# Patient Record
Sex: Male | Born: 1940 | Race: White | Hispanic: No | Smoking: Light tobacco smoker
Health system: Southern US, Community
[De-identification: ages and names within clinical notes are randomized; demographics above are authoritative.]

## PROBLEM LIST (undated history)

## (undated) DIAGNOSIS — E785 Hyperlipidemia, unspecified: Secondary | ICD-10-CM

## (undated) DIAGNOSIS — I10 Essential (primary) hypertension: Secondary | ICD-10-CM

## (undated) DIAGNOSIS — N189 Chronic kidney disease, unspecified: Secondary | ICD-10-CM

## (undated) DIAGNOSIS — I2510 Atherosclerotic heart disease of native coronary artery without angina pectoris: Secondary | ICD-10-CM

## (undated) DIAGNOSIS — I509 Heart failure, unspecified: Secondary | ICD-10-CM

## (undated) DIAGNOSIS — R569 Unspecified convulsions: Secondary | ICD-10-CM

## (undated) DIAGNOSIS — E119 Type 2 diabetes mellitus without complications: Secondary | ICD-10-CM

## (undated) DIAGNOSIS — K219 Gastro-esophageal reflux disease without esophagitis: Secondary | ICD-10-CM

## (undated) DIAGNOSIS — I219 Acute myocardial infarction, unspecified: Secondary | ICD-10-CM

## (undated) DIAGNOSIS — J45909 Unspecified asthma, uncomplicated: Secondary | ICD-10-CM

## (undated) HISTORY — DX: Acute myocardial infarction, unspecified: I21.9

## (undated) HISTORY — DX: Hyperlipidemia, unspecified: E78.5

## (undated) HISTORY — PX: CATARACT EXTRACTION, BILATERAL: SHX1313

## (undated) HISTORY — PX: CORONARY ANGIOPLASTY: SHX604

## (undated) HISTORY — DX: Type 2 diabetes mellitus without complications: E11.9

## (undated) HISTORY — DX: Essential (primary) hypertension: I10

## (undated) HISTORY — PX: COLONOSCOPY: SHX174

## (undated) HISTORY — PX: RETINAL DETACHMENT SURGERY: SHX105

## (undated) HISTORY — PX: CARDIAC SURGERY: SHX584

## (undated) HISTORY — DX: Heart failure, unspecified: I50.9

---

## 2007-07-31 ENCOUNTER — Ambulatory Visit: Payer: Self-pay | Admitting: Gastroenterology

## 2014-02-28 ENCOUNTER — Ambulatory Visit: Payer: Self-pay | Admitting: Family Medicine

## 2015-01-10 ENCOUNTER — Ambulatory Visit: Payer: Self-pay | Admitting: Ophthalmology

## 2015-01-11 ENCOUNTER — Ambulatory Visit: Payer: Self-pay | Admitting: Ophthalmology

## 2015-03-19 NOTE — Op Note (Signed)
PATIENT NAME:  Manuel Klein, Manuel Klein MR#:  [id number] DATE OF BIRTH:  1941-04-09  DATE OF PROCEDURE:  01/11/2015  PREPROCEDURE DIAGNOSIS: Macula off retinal detachment, left eye.  POSTPROCEDURE DIAGNOSIS: Macula off retinal detachment, left eye.  PROCEDURE: A 25-gauge pars plana vitrectomy with endolaser air-fluid exchange in 26% SF6.   SURGEON: Milus Height, MD  ANESTHESIA: Monitored anesthesia care with retrobulbar block.   COMPLICATIONS: None.   BLOOD LOSS: Minimal.   DESCRIPTION OF PROCEDURE: The patient presented to the clinic with a shallow macula off retinal detachment in the left eye. Risks, benefits, alternatives, and complications were discussed with the patient, and he elected to proceed with pars plana vitrectomy in the left eye.   On the date of surgery, the patient was greeted in the preoperative holding area. The left eye was marked. Consents were reviewed. The patient's questions were answered. The patient was then brought into the operating room and placed under monitored anesthesia care. Then 4 mL of a peribulbar block consisting of lidocaine plain, Marcaine plain and Wydase was injected. The left eye was then prepped and draped in the usual sterile fashion. Then 25-gauge trocars were then used, placed the trocars in the usual position. The infusion cannula was checked before starting the infusion. A core vitrectomy was performed. The patient notably did have a posterior vitreous attachment already. There was significant amount of vitreous debris, although no frank membranes were noted. The vitreous was trimmed for 360 degrees around the periphery with scleral depression. The breaks were then marked with endo-cautery. Then 360 degree scleral depression at this time was notable for several other tears in the areas of the detached retina and also in the area of attached retina as well. The breaks were marked. Further vitrectomy was performed over the area of the other retinal breaks  to ensure no vitreous traction on those areas. A fluid-fluid exchange was performed to clear the subretinal fluid and then the infusion was turned to air and an air-fluid exchange was performed. A drain retinotomy was created in the meridian of the original retinal breaks and the subretinal fluid was drained from underneath the retina until the retina was flat. Endolaser was applied to the retinal drain and all the areas of the retinal breaks along with a light PRP style laser in all the areas of detached retina and inferiorly. A confluent  row of laser was placed at the vitreous base for 270 degrees and the retinal surface was dried completely. Four times the vitreous volume of 26% SF6 was infused. The trocars were then removed. The pressure was acceptable by palpation. Subconjunctival cefuroxime and dexamethasone were injected. The patient was patched and shielded with Neo-Poly-Dex ointment and taken to the recovery area in stable condition.   ____________________________ Laban Emperor Oval Linsey, MD jdr:sb D: 01/15/2015 23:32:04 ET T: 01/16/2015 07:58:04 ET JOB#: [id number]  cc: Janett Billow D. Oval Linsey, MD, <Dictator> Damante Spragg D [facility] MD ELECTRONICALLY SIGNED 01/18/2015 10:08

## 2017-10-27 ENCOUNTER — Encounter (INDEPENDENT_AMBULATORY_CARE_PROVIDER_SITE_OTHER): Payer: Self-pay | Admitting: Vascular Surgery

## 2017-11-03 ENCOUNTER — Other Ambulatory Visit (INDEPENDENT_AMBULATORY_CARE_PROVIDER_SITE_OTHER): Payer: Self-pay | Admitting: Vascular Surgery

## 2017-11-03 ENCOUNTER — Other Ambulatory Visit: Payer: Self-pay

## 2017-11-03 ENCOUNTER — Encounter
Admission: RE | Admit: 2017-11-03 | Discharge: 2017-11-03 | Disposition: A | Discharge status: Home / Self Care | Payer: Medicare Other | Source: Ambulatory Visit | Attending: Vascular Surgery | Admitting: Vascular Surgery

## 2017-11-03 ENCOUNTER — Encounter (INDEPENDENT_AMBULATORY_CARE_PROVIDER_SITE_OTHER): Payer: Self-pay

## 2017-11-03 ENCOUNTER — Encounter (INDEPENDENT_AMBULATORY_CARE_PROVIDER_SITE_OTHER): Payer: Self-pay | Admitting: Vascular Surgery

## 2017-11-03 ENCOUNTER — Ambulatory Visit (INDEPENDENT_AMBULATORY_CARE_PROVIDER_SITE_OTHER): Payer: Medicare Other | Admitting: Vascular Surgery

## 2017-11-03 VITALS — BP 148/69 | HR 59 | Resp 17 | Ht 69.5 in | Wt 156.6 lb

## 2017-11-03 DIAGNOSIS — Z01818 Encounter for other preprocedural examination: Secondary | ICD-10-CM | POA: Insufficient documentation

## 2017-11-03 DIAGNOSIS — Z01810 Encounter for preprocedural cardiovascular examination: Secondary | ICD-10-CM | POA: Insufficient documentation

## 2017-11-03 DIAGNOSIS — N185 Chronic kidney disease, stage 5: Secondary | ICD-10-CM | POA: Insufficient documentation

## 2017-11-03 DIAGNOSIS — I10 Essential (primary) hypertension: Secondary | ICD-10-CM | POA: Diagnosis not present

## 2017-11-03 DIAGNOSIS — E785 Hyperlipidemia, unspecified: Secondary | ICD-10-CM

## 2017-11-03 DIAGNOSIS — E118 Type 2 diabetes mellitus with unspecified complications: Secondary | ICD-10-CM | POA: Insufficient documentation

## 2017-11-03 DIAGNOSIS — Z992 Dependence on renal dialysis: Secondary | ICD-10-CM | POA: Insufficient documentation

## 2017-11-03 HISTORY — DX: Unspecified convulsions: R56.9

## 2017-11-03 HISTORY — DX: Atherosclerotic heart disease of native coronary artery without angina pectoris: I25.10

## 2017-11-03 HISTORY — DX: Unspecified asthma, uncomplicated: J45.909

## 2017-11-03 HISTORY — DX: Chronic kidney disease, unspecified: N18.9

## 2017-11-03 HISTORY — DX: Gastro-esophageal reflux disease without esophagitis: K21.9

## 2017-11-03 LAB — CBC WITH DIFFERENTIAL/PLATELET
Basophils Absolute: 0.1 10*3/uL (ref 0–0.1)
Basophils Relative: 1 %
EOS ABS: 0.2 10*3/uL (ref 0–0.7)
EOS PCT: 3 %
HCT: 32.5 % — ABNORMAL LOW (ref 40.0–52.0)
HEMOGLOBIN: 11 g/dL — AB (ref 13.0–18.0)
LYMPHS ABS: 1.2 10*3/uL (ref 1.0–3.6)
Lymphocytes Relative: 17 %
MCH: 32.2 pg (ref 26.0–34.0)
MCHC: 34 g/dL (ref 32.0–36.0)
MCV: 94.6 fL (ref 80.0–100.0)
MONO ABS: 0.6 10*3/uL (ref 0.2–1.0)
MONOS PCT: 8 %
Neutro Abs: 5.3 10*3/uL (ref 1.4–6.5)
Neutrophils Relative %: 71 %
PLATELETS: 232 10*3/uL (ref 150–440)
RBC: 3.43 MIL/uL — ABNORMAL LOW (ref 4.40–5.90)
RDW: 13.4 % (ref 11.5–14.5)
WBC: 7.4 10*3/uL (ref 3.8–10.6)

## 2017-11-03 LAB — BASIC METABOLIC PANEL
Anion gap: 10 (ref 5–15)
BUN: 51 mg/dL — AB (ref 6–20)
CHLORIDE: 107 mmol/L (ref 101–111)
CO2: 23 mmol/L (ref 22–32)
CREATININE: 3.95 mg/dL — AB (ref 0.61–1.24)
Calcium: 8.5 mg/dL — ABNORMAL LOW (ref 8.9–10.3)
GFR calc Af Amer: 16 mL/min — ABNORMAL LOW (ref >60)
GFR calc non Af Amer: 14 mL/min — ABNORMAL LOW (ref >60)
Glucose, Bld: 261 mg/dL — ABNORMAL HIGH (ref 65–99)
Potassium: 4.8 mmol/L (ref 3.5–5.1)
Sodium: 140 mmol/L (ref 135–145)

## 2017-11-03 LAB — TYPE AND SCREEN
ABO/RH(D): A POS
Antibody Screen: NEGATIVE

## 2017-11-03 LAB — APTT: APTT: 26 s (ref 24–36)

## 2017-11-03 LAB — PROTIME-INR
INR: 1.03
PROTHROMBIN TIME: 13.4 s (ref 11.4–15.2)

## 2017-11-03 NOTE — Patient Instructions (Addendum)
Your procedure is scheduled on: Friday 11/07/17 Report to [place]. 2ND FLOOR MEDICAL MALL ENTRANCE. To find out your arrival time please call [phone] between 1PM - 3PM on Thursday 11/06/17.  Remember: Instructions that are not followed completely may result in serious medical risk, up to and including death, or upon the discretion of your surgeon and anesthesiologist your surgery may need to be rescheduled.    __X__ 1. Do not eat anything after midnight the night before your    procedure.  No gum chewing or hard candies.  You may drink clear   liquids up to 2 hours before you are scheduled to arrive at the   hospital for your procedure. Do not drink clear liquids within 2   hours of scheduled arrival to the hospital as this may lead to your   procedure being delayed or rescheduled.       Clear liquids include:   Water or Apple juice without pulp   Clear carbohydrate beverage such as Clearfast or Gatorade   Black coffee or Clear Tea (no milk, no creamer, do not add anything   to the coffee or tea)    Diabetics should only drink water   __X__ 2. No Alcohol for 24 hours before or after surgery.   ____ 3. Bring all medications with you on the day of surgery if instructed.    __X__ 4. Notify your doctor if there is any change in your medical condition     (cold, fever, infections).             __X___5. No smoking within 24 hours of your surgery.     Do not wear jewelry, make-up, hairpins, clips or nail polish.  Do not wear lotions, powders, or perfumes.   Do not shave 48 hours prior to surgery. Men may shave face and neck.  Do not bring valuables to the hospital.    [facility] is not responsible for any belongings or valuables.               Contacts, dentures or bridgework may not be worn into surgery.  Leave your suitcase in the car. After surgery it may be brought to your room.  For patients admitted to the hospital, discharge time is determined by your                 treatment team.   Patients discharged the day of surgery will not be allowed to drive home.   Please read over the following fact sheets that you were given:   MRSA Information   __X__ Take these medicines the morning of surgery with A SIP OF WATER:    1. DILTIAZEM  2. METOPROLOL  3. RANITIDINE  4. TAMSULOSIN  5. VENTOLIN  6.  ____ Fleet Enema (as directed)   __X__ Use CHG Soap/SAGE wipes as directed  __X__ Use inhalers on the day of surgery  ____ Stop metformin 2 days prior to surgery    ____ Take 1/2 of usual insulin dose the night before surgery and none on the morning of surgery.   __X__ Stop Coumadin/Plavix/aspirin on cONTACT DR [place]  __X__ Stop Anti-inflammatories such as Advil, Aleve, Ibuprofen, Motrin, Naproxen, Naprosyn, Goodies,powder, or aspirin products.  OK to take Tylenol.   __X__ Stop supplements, Vitamin E, Fish Oil until after surgery.    ____ Bring C-Pap to the hospital.

## 2017-11-03 NOTE — Progress Notes (Signed)
Subjective:    Patient ID: Manuel Klein, male    DOB: Apr 25, 1941, 76 y.o.   MRN: [id number] Chief Complaint  Patient presents with  . New Patient (Initial Visit)    pd cath replacement   Presents as a new patient referred by Dr. Holley Raring for creation of a peritoneal dialysis catheter.  The patient is currently not on dialysis however Dr. Holley Raring predicts he will need to start in approximately one month.  The patient was seen with his wife.  At this point, he is asymptomatic.  He denies any uremic symptoms.  Denies any fever, nausea vomiting.  The patient is a attended a dialysis class and has decided to move forward with peritoneal dialysis versus hemodialysis.  The presents today to discuss creation.   Review of Systems  Constitutional: Negative.   HENT: Negative.   Eyes: Negative.   Respiratory: Negative.   Cardiovascular: Negative.   Gastrointestinal: Negative.   Endocrine: Negative.   Genitourinary:       ESRD  Musculoskeletal: Negative.   Skin: Negative.   Allergic/Immunologic: Negative.   Neurological: Negative.   Hematological: Negative.   Psychiatric/Behavioral: Negative.       Objective:   Physical Exam  Constitutional: He is oriented to person, place, and time. He appears well-developed and well-nourished. No distress.  HENT:  Head: Normocephalic and atraumatic.  Eyes: Conjunctivae are normal. Pupils are equal, round, and reactive to light.  Neck: Normal range of motion.  Cardiovascular: Normal rate, regular rhythm, normal heart sounds and intact distal pulses.  Pulses:      Radial pulses are 2+ on the right side, and 2+ on the left side.       Dorsalis pedis pulses are 2+ on the right side, and 2+ on the left side.       Posterior tibial pulses are 2+ on the right side, and 2+ on the left side.  Pulmonary/Chest: Effort normal and breath sounds normal. No respiratory distress. He has no wheezes. He has no rales.  Abdominal: Soft. Bowel sounds are normal. He  exhibits no distension. There is no tenderness. There is no rebound.  Musculoskeletal: Normal range of motion. He exhibits edema (Mild bilateral lower extremity edema noted).  Neurological: He is alert and oriented to person, place, and time.  Skin: Skin is warm and dry. He is not diaphoretic.  Psychiatric: He has a normal mood and affect. His behavior is normal. Judgment and thought content normal.  Vitals reviewed.  BP (!) 148/69 (BP Location: Right Arm)   Pulse (!) 59   Resp 17   Ht 5' 9.5" (1.765 m)   Wt 156 lb 9.6 oz (71 kg)   BMI 22.79 kg/m   Past Medical History:  Diagnosis Date  . CHF (congestive heart failure) ([place])   . Diabetes mellitus without complication ([place])   . Hyperlipidemia   . Hypertension   . Myocardial infarction [facility])    Social History   Socioeconomic History  . Marital status: Divorced    Spouse name: Not on file  . Number of children: Not on file  . Years of education: Not on file  . Highest education level: Not on file  Social Needs  . Financial resource strain: Not on file  . Food insecurity - worry: Not on file  . Food insecurity - inability: Not on file  . Transportation needs - medical: Not on file  . Transportation needs - non-medical: Not on file  Occupational History  . Not  on file  Tobacco Use  . Smoking status: Light Tobacco Smoker  . Smokeless tobacco: Never Used  Substance and Sexual Activity  . Alcohol use: No    Frequency: Never  . Drug use: No  . Sexual activity: Not on file  Other Topics Concern  . Not on file  Social History Narrative  . Not on file   Past Surgical History:  Procedure Laterality Date  . CARDIAC SURGERY     stent placement   Family History  Problem Relation Age of Onset  . Heart disease Mother   . Diabetes Mother   . Diabetes Father    Allergies  Allergen Reactions  . Ciprofloxacin Other (See Comments)  . Penicillins Other (See Comments)  . Sulfa Antibiotics Rash      Assessment & Plan:    Presents as a new patient referred by Dr. Holley Raring for creation of a peritoneal dialysis catheter.  The patient is currently not on dialysis however Dr. Holley Raring predicts he will need to start in approximately one month.  The patient was seen with his wife.  At this point, he is asymptomatic.  He denies any uremic symptoms.  Denies any fever, nausea vomiting.  The patient is a attended a dialysis class and has decided to move forward with peritoneal dialysis versus hemodialysis.  The presents today to discuss creation.  1. Stage 5 chronic kidney disease not on chronic dialysis [facility]) - New Patient with chronic kidney disease he will need to start dialysis in approximately one month. He presents today to discuss creation of a peritoneal dialysis catheter. I had a long discussion about the procedure, risks and benefits with the patient and his wife All their questions were answered The patient would like to proceed The patient's H&P was updated as well as his medications past medical history past surgical history allergies etc. The patient denies any new illnesses or medications. We will plan on creation of a laparoscopic peritoneal dialysis catheter  2. Hyperlipidemia, unspecified hyperlipidemia type - Stable Encouraged good control as its slows the progression of atherosclerotic disease  3. Type 2 diabetes mellitus with complication, unspecified whether long term insulin use (HCC) - Stable Encouraged good control as its slows the progression of atherosclerotic disease  4. Essential hypertension - Stable Encouraged good control as its slows the progression of atherosclerotic disease  Current Outpatient Medications on File Prior to Visit  Medication Sig Dispense Refill  . aspirin 81 MG tablet Take by mouth.    Marland Kitchen atorvastatin (LIPITOR) 40 MG tablet     . calcitRIOL (ROCALTROL) 0.25 MCG capsule TK 1 C PO QD    . cyanocobalamin ([employer] VITAMIN B12) 1000 MCG tablet Take by mouth.    . diltiazem  (CARDIZEM CD) 360 MG 24 hr capsule TK 1 C PO D  3  . insulin lispro (HUMALOG KWIKPEN) 100 UNIT/ML KiwkPen ADMINISTER UP TO 8 UNITS UNDER THE SKIN TWICE DAILY AS DIRECTED    . losartan (COZAAR) 100 MG tablet     . metoprolol succinate (TOPROL-XL) 25 MG 24 hr tablet   11  . ranitidine (ZANTAC) 150 MG tablet Take 150 mg by mouth 2 (two) times daily as needed for heartburn.    . sodium bicarbonate 650 MG tablet TK 1 T PO BID    . tamsulosin (FLOMAX) 0.4 MG CAPS capsule TK 1 C PO D  11  . TOUJEO SOLOSTAR 300 UNIT/ML SOPN INJECT 15 UNITS SQ D  0  . VENTOLIN HFA 108 (90  Base) MCG/ACT inhaler INL 2 PFS PO QID PRF WHEEZING  1   No current facility-administered medications on file prior to visit.    There are no Patient Instructions on file for this visit. No Follow-up on file.  KIMBERLY A STEGMAYER, PA-C

## 2017-11-06 MED ORDER — VANCOMYCIN HCL IN DEXTROSE 1-5 GM/200ML-% IV SOLN
1000.0000 mg | INTRAVENOUS | Status: AC
  Administered 2017-11-07: 1000 mg via INTRAVENOUS

## 2017-11-07 ENCOUNTER — Encounter
Admission: RE | Disposition: A | Discharge status: Home / Self Care | Payer: Self-pay | Source: Ambulatory Visit | Attending: Vascular Surgery

## 2017-11-07 ENCOUNTER — Other Ambulatory Visit: Payer: Self-pay

## 2017-11-07 ENCOUNTER — Ambulatory Visit
Admission: RE | Admit: 2017-11-07 | Discharge: 2017-11-07 | Disposition: A | Discharge status: Home / Self Care | Payer: Medicare Other | Source: Ambulatory Visit | Attending: Vascular Surgery | Admitting: Vascular Surgery

## 2017-11-07 ENCOUNTER — Inpatient Hospital Stay: Payer: Medicare Other | Admitting: Anesthesiology

## 2017-11-07 ENCOUNTER — Encounter: Payer: Self-pay | Admitting: *Deleted

## 2017-11-07 DIAGNOSIS — I132 Hypertensive heart and chronic kidney disease with heart failure and with stage 5 chronic kidney disease, or end stage renal disease: Secondary | ICD-10-CM | POA: Diagnosis not present

## 2017-11-07 DIAGNOSIS — Z881 Allergy status to other antibiotic agents status: Secondary | ICD-10-CM | POA: Diagnosis not present

## 2017-11-07 DIAGNOSIS — Z882 Allergy status to sulfonamides status: Secondary | ICD-10-CM | POA: Diagnosis not present

## 2017-11-07 DIAGNOSIS — I2510 Atherosclerotic heart disease of native coronary artery without angina pectoris: Secondary | ICD-10-CM | POA: Diagnosis not present

## 2017-11-07 DIAGNOSIS — Z79899 Other long term (current) drug therapy: Secondary | ICD-10-CM | POA: Diagnosis not present

## 2017-11-07 DIAGNOSIS — Z955 Presence of coronary angioplasty implant and graft: Secondary | ICD-10-CM | POA: Diagnosis not present

## 2017-11-07 DIAGNOSIS — Z888 Allergy status to other drugs, medicaments and biological substances status: Secondary | ICD-10-CM | POA: Diagnosis not present

## 2017-11-07 DIAGNOSIS — N186 End stage renal disease: Secondary | ICD-10-CM | POA: Insufficient documentation

## 2017-11-07 DIAGNOSIS — Z794 Long term (current) use of insulin: Secondary | ICD-10-CM | POA: Insufficient documentation

## 2017-11-07 DIAGNOSIS — E785 Hyperlipidemia, unspecified: Secondary | ICD-10-CM | POA: Diagnosis not present

## 2017-11-07 DIAGNOSIS — I509 Heart failure, unspecified: Secondary | ICD-10-CM | POA: Diagnosis not present

## 2017-11-07 DIAGNOSIS — Z7982 Long term (current) use of aspirin: Secondary | ICD-10-CM | POA: Insufficient documentation

## 2017-11-07 DIAGNOSIS — K219 Gastro-esophageal reflux disease without esophagitis: Secondary | ICD-10-CM | POA: Diagnosis not present

## 2017-11-07 DIAGNOSIS — J45909 Unspecified asthma, uncomplicated: Secondary | ICD-10-CM | POA: Diagnosis not present

## 2017-11-07 DIAGNOSIS — E1122 Type 2 diabetes mellitus with diabetic chronic kidney disease: Secondary | ICD-10-CM | POA: Diagnosis present

## 2017-11-07 DIAGNOSIS — Z880 Allergy status to penicillin: Secondary | ICD-10-CM | POA: Insufficient documentation

## 2017-11-07 DIAGNOSIS — F17200 Nicotine dependence, unspecified, uncomplicated: Secondary | ICD-10-CM | POA: Diagnosis not present

## 2017-11-07 DIAGNOSIS — I252 Old myocardial infarction: Secondary | ICD-10-CM | POA: Diagnosis not present

## 2017-11-07 HISTORY — PX: CAPD INSERTION: SHX5233

## 2017-11-07 LAB — POCT I-STAT 4, (NA,K, GLUC, HGB,HCT)
Glucose, Bld: 113 mg/dL — ABNORMAL HIGH (ref 65–99)
HCT: 29 % — ABNORMAL LOW (ref 39.0–52.0)
Hemoglobin: 9.9 g/dL — ABNORMAL LOW (ref 13.0–17.0)
POTASSIUM: 4.1 mmol/L (ref 3.5–5.1)
SODIUM: 141 mmol/L (ref 135–145)

## 2017-11-07 LAB — GLUCOSE, CAPILLARY
GLUCOSE-CAPILLARY: 128 mg/dL — AB (ref 65–99)
Glucose-Capillary: 155 mg/dL — ABNORMAL HIGH (ref 65–99)

## 2017-11-07 LAB — ABO/RH: ABO/RH(D): A POS

## 2017-11-07 SURGERY — LAPAROSCOPIC INSERTION CONTINUOUS AMBULATORY PERITONEAL DIALYSIS  (CAPD) CATHETER
Anesthesia: General | Wound class: Clean

## 2017-11-07 MED ORDER — FAMOTIDINE 20 MG PO TABS
20.0000 mg | ORAL_TABLET | Freq: Once | ORAL | Status: AC
  Administered 2017-11-07: 20 mg via ORAL

## 2017-11-07 MED ORDER — PROMETHAZINE HCL 25 MG/ML IJ SOLN: 6.2500 mg | INTRAMUSCULAR | Status: DC | PRN

## 2017-11-07 MED ORDER — PROPOFOL 10 MG/ML IV BOLUS
INTRAVENOUS | Status: AC
  Filled 2017-11-07: qty 20

## 2017-11-07 MED ORDER — VANCOMYCIN HCL IN DEXTROSE 1-5 GM/200ML-% IV SOLN
INTRAVENOUS | Status: AC
  Administered 2017-11-07: 1000 mg via INTRAVENOUS
  Filled 2017-11-07: qty 200

## 2017-11-07 MED ORDER — FENTANYL CITRATE (PF) 100 MCG/2ML IJ SOLN
INTRAMUSCULAR | Status: DC | PRN
  Administered 2017-11-07 (×3): 50 ug via INTRAVENOUS

## 2017-11-07 MED ORDER — NEOSTIGMINE METHYLSULFATE 10 MG/10ML IV SOLN
INTRAVENOUS | Status: AC
  Filled 2017-11-07: qty 1

## 2017-11-07 MED ORDER — ROCURONIUM BROMIDE 50 MG/5ML IV SOLN
INTRAVENOUS | Status: AC
  Filled 2017-11-07: qty 1

## 2017-11-07 MED ORDER — BUPIVACAINE-EPINEPHRINE (PF) 0.25% -1:200000 IJ SOLN
INTRAMUSCULAR | Status: DC | PRN
  Administered 2017-11-07: 10 mL

## 2017-11-07 MED ORDER — EPHEDRINE SULFATE 50 MG/ML IJ SOLN
INTRAMUSCULAR | Status: AC
  Filled 2017-11-07: qty 1

## 2017-11-07 MED ORDER — FENTANYL CITRATE (PF) 100 MCG/2ML IJ SOLN
INTRAMUSCULAR | Status: AC
  Filled 2017-11-07: qty 2

## 2017-11-07 MED ORDER — BUPIVACAINE-EPINEPHRINE (PF) 0.25% -1:200000 IJ SOLN
INTRAMUSCULAR | Status: AC
  Filled 2017-11-07: qty 30

## 2017-11-07 MED ORDER — HYDROCODONE-ACETAMINOPHEN 5-325 MG PO TABS: 1.0000 | ORAL_TABLET | Freq: Four times a day (QID) | ORAL | 0 refills | Status: DC | PRN

## 2017-11-07 MED ORDER — PROPOFOL 10 MG/ML IV BOLUS
INTRAVENOUS | Status: DC | PRN
  Administered 2017-11-07: 100 mg via INTRAVENOUS

## 2017-11-07 MED ORDER — GLYCOPYRROLATE 0.2 MG/ML IJ SOLN
INTRAMUSCULAR | Status: DC | PRN
  Administered 2017-11-07: .7 mg via INTRAVENOUS

## 2017-11-07 MED ORDER — FENTANYL CITRATE (PF) 100 MCG/2ML IJ SOLN
25.0000 ug | INTRAMUSCULAR | Status: DC | PRN
Start: 2017-11-07 — End: 2017-11-07

## 2017-11-07 MED ORDER — SUGAMMADEX SODIUM 200 MG/2ML IV SOLN
INTRAVENOUS | Status: AC
  Filled 2017-11-07: qty 2

## 2017-11-07 MED ORDER — EPHEDRINE SULFATE 50 MG/ML IJ SOLN
INTRAMUSCULAR | Status: DC | PRN
  Administered 2017-11-07 (×2): 10 mg via INTRAVENOUS
  Administered 2017-11-07: 5 mg via INTRAVENOUS
  Administered 2017-11-07: 15 mg via INTRAVENOUS

## 2017-11-07 MED ORDER — CHLORHEXIDINE GLUCONATE CLOTH 2 % EX PADS: 6.0000 | MEDICATED_PAD | Freq: Once | CUTANEOUS | Status: DC

## 2017-11-07 MED ORDER — ONDANSETRON HCL 4 MG/2ML IJ SOLN
INTRAMUSCULAR | Status: DC | PRN
  Administered 2017-11-07: 4 mg via INTRAVENOUS

## 2017-11-07 MED ORDER — MEPERIDINE HCL 50 MG/ML IJ SOLN: 6.2500 mg | INTRAMUSCULAR | Status: DC | PRN

## 2017-11-07 MED ORDER — GLYCOPYRROLATE 0.2 MG/ML IJ SOLN
INTRAMUSCULAR | Status: AC
  Filled 2017-11-07: qty 1

## 2017-11-07 MED ORDER — LIDOCAINE HCL (CARDIAC) 20 MG/ML IV SOLN
INTRAVENOUS | Status: DC | PRN
  Administered 2017-11-07: 80 mg via INTRAVENOUS

## 2017-11-07 MED ORDER — ROCURONIUM BROMIDE 100 MG/10ML IV SOLN
INTRAVENOUS | Status: DC | PRN
  Administered 2017-11-07: 50 mg via INTRAVENOUS

## 2017-11-07 MED ORDER — LIDOCAINE HCL (PF) 2 % IJ SOLN
INTRAMUSCULAR | Status: AC
  Filled 2017-11-07: qty 10

## 2017-11-07 MED ORDER — DEXAMETHASONE SODIUM PHOSPHATE 10 MG/ML IJ SOLN
INTRAMUSCULAR | Status: AC
  Filled 2017-11-07: qty 1

## 2017-11-07 MED ORDER — NEOSTIGMINE METHYLSULFATE 10 MG/10ML IV SOLN
INTRAVENOUS | Status: DC | PRN
  Administered 2017-11-07: 3.5 mg via INTRAVENOUS

## 2017-11-07 MED ORDER — OXYCODONE HCL 5 MG PO TABS: 5.0000 mg | ORAL_TABLET | Freq: Once | ORAL | Status: DC | PRN

## 2017-11-07 MED ORDER — OXYCODONE HCL 5 MG/5ML PO SOLN: 5.0000 mg | Freq: Once | ORAL | Status: DC | PRN

## 2017-11-07 MED ORDER — SODIUM CHLORIDE 0.9 % IV SOLN
INTRAVENOUS | Status: DC
  Administered 2017-11-07: 07:00:00 via INTRAVENOUS

## 2017-11-07 MED ORDER — FAMOTIDINE 20 MG PO TABS
ORAL_TABLET | ORAL | Status: AC
  Administered 2017-11-07: 20 mg via ORAL
  Filled 2017-11-07: qty 1

## 2017-11-07 MED ORDER — ONDANSETRON HCL 4 MG/2ML IJ SOLN
INTRAMUSCULAR | Status: AC
  Filled 2017-11-07: qty 2

## 2017-11-07 SURGICAL SUPPLY — 37 items
ADAPTER BETA CAP QUINTON DIALY (ADAPTER) ×3 IMPLANT
ADAPTER CATH DIALYSIS 18.75 (CATHETERS) ×2 IMPLANT
ADAPTER CATH DIALYSIS 18.75CM (CATHETERS) ×1
BLADE SURG 15 STRL LF DISP TIS (BLADE) ×1 IMPLANT
BLADE SURG 15 STRL SS (BLADE) ×2
CANISTER SUCT 1200ML W/VALVE (MISCELLANEOUS) ×3 IMPLANT
CATH DLYS SWAN NECK 62.5CM (CATHETERS) ×3 IMPLANT
CHLORAPREP W/TINT 26ML (MISCELLANEOUS) ×3 IMPLANT
DERMABOND ADVANCED (GAUZE/BANDAGES/DRESSINGS) ×2
DERMABOND ADVANCED .7 DNX12 (GAUZE/BANDAGES/DRESSINGS) ×1 IMPLANT
ELECT REM PT RETURN 9FT ADLT (ELECTROSURGICAL) ×3
ELECTRODE REM PT RTRN 9FT ADLT (ELECTROSURGICAL) ×1 IMPLANT
GAUZE SPONGE 4X4 12PLY STRL (GAUZE/BANDAGES/DRESSINGS) ×3 IMPLANT
GLOVE BIO SURGEON STRL SZ7 (GLOVE) ×3 IMPLANT
GLOVE INDICATOR 7.5 STRL GRN (GLOVE) ×3 IMPLANT
GLOVE SURG SYN 8.0 (GLOVE) ×3 IMPLANT
GOWN STRL REUS W/ TWL LRG LVL3 (GOWN DISPOSABLE) ×2 IMPLANT
GOWN STRL REUS W/ TWL XL LVL3 (GOWN DISPOSABLE) ×1 IMPLANT
GOWN STRL REUS W/TWL LRG LVL3 (GOWN DISPOSABLE) ×4
GOWN STRL REUS W/TWL XL LVL3 (GOWN DISPOSABLE) ×2
KIT RM TURNOVER STRD PROC AR (KITS) ×3 IMPLANT
LABEL OR SOLS (LABEL) ×3 IMPLANT
MINICAP W/POVIDONE IODINE SOL (MISCELLANEOUS) ×3 IMPLANT
NEEDLE HYPO 25X1 1.5 SAFETY (NEEDLE) ×3 IMPLANT
NEEDLE INSUFFLATION 150MM (ENDOMECHANICALS) ×3 IMPLANT
NS IRRIG 500ML POUR BTL (IV SOLUTION) ×3 IMPLANT
PACK LAP CHOLECYSTECTOMY (MISCELLANEOUS) ×3 IMPLANT
PENCIL ELECTRO HAND CTR (MISCELLANEOUS) ×3 IMPLANT
SET TRANSFER 6 W/TWIST CLAMP 5 (SET/KITS/TRAYS/PACK) ×3 IMPLANT
SLEEVE ENDOPATH XCEL 5M (ENDOMECHANICALS) ×3 IMPLANT
SUT MNCRL AB 4-0 PS2 18 (SUTURE) ×3 IMPLANT
SUT VIC AB 3-0 SH 27 (SUTURE) ×4
SUT VIC AB 3-0 SH 27X BRD (SUTURE) ×2 IMPLANT
SUT VICRYL 0 AB UR-6 (SUTURE) ×6 IMPLANT
SYR 50ML LL SCALE MARK (SYRINGE) ×3 IMPLANT
TROCAR XCEL NON-BLD 5MMX100MML (ENDOMECHANICALS) ×3 IMPLANT
TUBING INSUFFLATION (TUBING) ×3 IMPLANT

## 2017-11-07 NOTE — Discharge Instructions (Signed)

## 2017-11-07 NOTE — Anesthesia Preprocedure Evaluation (Signed)
Anesthesia Evaluation  Patient identified by MRN, date of birth, ID band Patient awake    Reviewed: Allergy & Precautions, NPO status , Patient's Chart, lab work & pertinent test results  History of Anesthesia Complications Negative for: history of anesthetic complications  Airway Mallampati: II  TM Distance: >3 FB Neck ROM: Full    Dental no notable dental hx.    Pulmonary asthma , Current Smoker,    breath sounds clear to auscultation- rhonchi (-) wheezing      Cardiovascular hypertension, Pt. on medications + CAD, + Past MI and + Cardiac Stents (last stent 12 yrs ago)   Rhythm:Regular Rate:Normal - Systolic murmurs and - Diastolic murmurs Echo 42/70/62: NORMAL LEFT VENTRICULAR SYSTOLIC FUNCTION WITH MODERATE LVH NORMAL LA PRESSURES WITH NORMAL DIASTOLIC FUNCTION NORMAL RIGHT VENTRICULAR SYSTOLIC FUNCTION VALVULAR REGURGITATION: MILD AR, MILD TR VALVULAR STENOSIS: TRIVIAL AS   Neuro/Psych negative neurological ROS  negative psych ROS   GI/Hepatic Neg liver ROS, GERD  ,  Endo/Other  diabetes, Insulin Dependent  Renal/GU CRFRenal disease (stage IV, not yet on dialysis )     Musculoskeletal negative musculoskeletal ROS (+)   Abdominal (+) - obese,   Peds  Hematology negative hematology ROS (+)   Anesthesia Other Findings Past Medical History: No date: Asthma No date: CHF (congestive heart failure) (HCC) No date: Chronic kidney disease     Comment:  stage IV No date: Coronary artery disease No date: Diabetes mellitus without complication (HCC) No date: GERD (gastroesophageal reflux disease) No date: Hyperlipidemia No date: Hypertension No date: Myocardial infarction ([place]) No date: Seizures ([place])     Comment:  none since 20"s   Reproductive/Obstetrics                             Anesthesia Physical Anesthesia Plan  ASA: III  Anesthesia Plan: General   Post-op Pain  Management:    Induction: Intravenous  PONV Risk Score and Plan: 0 and Ondansetron  Airway Management Planned: Oral ETT  Additional Equipment:   Intra-op Plan:   Post-operative Plan: Extubation in OR  Informed Consent: I have reviewed the patients History and Physical, chart, labs and discussed the procedure including the risks, benefits and alternatives for the proposed anesthesia with the patient or authorized representative who has indicated his/her understanding and acceptance.   Dental advisory given  Plan Discussed with: CRNA and Anesthesiologist  Anesthesia Plan Comments:         Anesthesia Quick Evaluation

## 2017-11-07 NOTE — H&P (Signed)
[facility] History & Physical Update  The patient was interviewed and re-examined.  The patient's previous History and Physical has been reviewed and is unchanged.  There is no change in the plan of care. We plan to proceed with the scheduled procedure.  Hortencia Pilar, MD  11/07/2017, 7:48 AM

## 2017-11-07 NOTE — Op Note (Signed)
  OPERATIVE NOTE   PROCEDURE: 1. Laparoscopic peritoneal dialysis catheter placement.  PRE-OPERATIVE DIAGNOSIS: 1.  End-stage renal disease   POST-OPERATIVE DIAGNOSIS: Same  SURGEON: Hortencia Pilar  ASSISTANT(S): None  ANESTHESIA: general  ESTIMATED BLOOD LOSS: Minimal   FINDING(S): 1. None  SPECIMEN(S): None  INDICATIONS:  Manuel Klein is a 76 y.o. male who presents with end-stage renal disease. The patient has decided to do peritoneal dialysis for his long-term dialysis. Risks and benefits of placement were discussed and he is agreeable to proceed.  Differences between peritoneal dialysis and hemodialysis were discussed.    DESCRIPTION:  After obtaining full informed written consent, the patient was brought back to the operating room and placed supine upon the operating table. The patient received IV antibiotics prior to induction. After obtaining adequate anesthesia, the abdomen was prepped and draped in the standard fashion.   A small vertical incision was made in the midline just above the umbilicus and the dissection was carried down through the soft tissue to expose the fascia. Two 0 Vicryl sutures were then used to secure the fascia just lateral to the midline on both sides and an 15 blade was used to incise the fascia. The fascia is then elevated with a bone hook Varies needle is introduced and a saline test was performed indicating intraperitoneal location and insufflated of the abdomen with carbon dioxide is performed to 15 mmHg pressure. A 5 mm trocar is then placed again maintaining elevation of the fascia with a bone hook.  A oblique incision is then made in the left lower quadrant to the lateral portion of the rectus muscle the dissection is carried down through the soft tissues and the fascia is exposed. Small incision is made in the fascia with the Bovie and a pursestring suture of 0 Vicryl is placed. Upon initial attempt of placing the Seldinger needle the  adhesions are obstructing the catheter positioning and view of the placement and therefore they must be removed.  Seldinger needle was then inserted under direct visualization and the wire introduced under the view of the camera trocar was then placed and the catheter was advanced into the abdominal cavity over the trocar. Under direct visualization the catheters curled portion was positioned deep into the pelvis just to the right of midline. It was irrigated with heparinized saline. The deep cuff was secured to the fascial pursestring suture. A small counterincision was made in the right upper quadrant of the abdomen and the catheter was brought out this site. The appropriate distal connectors were placed, and I then placed 300 cc of saline through the catheter into the pelvis. The abdomen was desufflated. Immediately, 250 cc of effluent returned through the catheter when the bag was placed to gravity. I took one more look with the camera to ensure that the catheter was in the pelvis and it was. The hasson trocar was then removed. I then closed the incisions with a 2-0 Vicryl in the right lower quadrant incision and subsequently 3-0 Vicryl and 4-0 Monocryl, the other incisions were closed with 3-0 Vicryl and 4-0 Monocryl and placed Dermabond as dressing. Dry dressing was placed around the catheter exit site. The patient was then awakened from anesthesia and taken to the recovery room in stable condition having tolerated the procedure well.   COMPLICATIONS: None  CONDITION: None  Hortencia Pilar 11/07/2017, 8:55 AM

## 2017-11-07 NOTE — Anesthesia Post-op Follow-up Note (Signed)
Anesthesia QCDR form completed.        

## 2017-11-07 NOTE — Transfer of Care (Signed)
Immediate Anesthesia Transfer of Care Note  Patient: Manuel Klein  Procedure(s) Performed: LAPAROSCOPIC INSERTION CONTINUOUS AMBULATORY PERITONEAL DIALYSIS  (CAPD) CATHETER (N/A )  Patient Location: PACU  Anesthesia Type:General  Level of Consciousness: drowsy and patient cooperative  Airway & Oxygen Therapy: Patient Spontanous Breathing and Patient connected to face mask oxygen  Post-op Assessment: Report given to RN and Post -op Vital signs reviewed and stable  Post vital signs: Reviewed and stable  Last Vitals:  Vitals:   11/07/17 0621 11/07/17 0852  BP: (!) 160/62 (!) (P) 153/61  Pulse: (!) 57 (P) 82  Resp: 16 (P) 18  Temp: 36.7 C (P) 36.7 C  SpO2: 99% (P) 99%    Last Pain:  Vitals:   11/07/17 8412  TempSrc: Oral         Complications: No apparent anesthesia complications

## 2017-11-07 NOTE — Anesthesia Postprocedure Evaluation (Signed)
Anesthesia Post Note  Patient: Manuel Klein  Procedure(s) Performed: LAPAROSCOPIC INSERTION CONTINUOUS AMBULATORY PERITONEAL DIALYSIS  (CAPD) CATHETER (N/A )  Patient location during evaluation: PACU Anesthesia Type: General Level of consciousness: awake and alert and oriented Pain management: pain level controlled Vital Signs Assessment: post-procedure vital signs reviewed and stable Respiratory status: spontaneous breathing, nonlabored ventilation and respiratory function stable Cardiovascular status: blood pressure returned to baseline and stable Postop Assessment: no signs of nausea or vomiting Anesthetic complications: no     Last Vitals:  Vitals:   11/07/17 0920 11/07/17 0929  BP: (!) 145/62 (!) 144/56  Pulse: 66 66  Resp: (!) 23 16  Temp:  36.6 C  SpO2: 95% 96%    Last Pain:  Vitals:   11/07/17 0929  TempSrc: Oral  PainSc: 0-No pain                 Kristain Hu

## 2017-11-07 NOTE — Anesthesia Procedure Notes (Signed)
Procedure Name: Intubation Date/Time: 11/07/2017 7:42 AM Performed by: Eben Burow, CRNA Pre-anesthesia Checklist: Patient identified, Emergency Drugs available, Suction available, Patient being monitored and Timeout performed Patient Re-evaluated:Patient Re-evaluated prior to induction Oxygen Delivery Method: Circle system utilized Preoxygenation: Pre-oxygenation with 100% oxygen Induction Type: IV induction Ventilation: Mask ventilation without difficulty Laryngoscope Size: Mac and 4 Grade View: Grade I Tube type: Oral Tube size: 7.5 mm Number of attempts: 1 Airway Equipment and Method: Stylet Placement Confirmation: ETT inserted through vocal cords under direct vision,  positive ETCO2 and breath sounds checked- equal and bilateral Secured at: 23 cm Tube secured with: Tape Dental Injury: Teeth and Oropharynx as per pre-operative assessment

## 2017-12-01 ENCOUNTER — Encounter (INDEPENDENT_AMBULATORY_CARE_PROVIDER_SITE_OTHER): Payer: Self-pay | Admitting: Vascular Surgery

## 2017-12-01 ENCOUNTER — Ambulatory Visit (INDEPENDENT_AMBULATORY_CARE_PROVIDER_SITE_OTHER): Payer: Medicare Other | Admitting: Vascular Surgery

## 2017-12-01 VITALS — BP 131/67 | HR 55 | Resp 14 | Ht 68.0 in | Wt 154.0 lb

## 2017-12-01 DIAGNOSIS — E785 Hyperlipidemia, unspecified: Secondary | ICD-10-CM | POA: Diagnosis not present

## 2017-12-01 DIAGNOSIS — N185 Chronic kidney disease, stage 5: Secondary | ICD-10-CM

## 2017-12-01 DIAGNOSIS — I10 Essential (primary) hypertension: Secondary | ICD-10-CM | POA: Diagnosis not present

## 2017-12-01 DIAGNOSIS — E118 Type 2 diabetes mellitus with unspecified complications: Secondary | ICD-10-CM

## 2017-12-01 NOTE — Progress Notes (Signed)
MRN : [id number]  Manuel Klein is a 77 y.o. (Jul 11, 1941) male who presents with chief complaint of  Chief Complaint  Patient presents with  . Follow-up    3 week [facility] f/u  .  History of Present Illness: The patient returns to the office for followup of their dialysis access. The function of the access has been stable. The PD catheter has been flushed 3 times and functions well.  The patient denies redness or swelling at the access site. The patient denies fever or chills at home or while on dialysis.  The patient denies amaurosis fugax or recent TIA symptoms. There are no recent neurological changes noted. The patient denies claudication symptoms or rest pain symptoms. The patient denies history of DVT, PE or superficial thrombophlebitis. The patient denies recent episodes of angina or shortness of breath.        Current Meds  Medication Sig  . aspirin 81 MG tablet Take 81 mg by mouth daily.   . calcitRIOL (ROCALTROL) 0.25 MCG capsule Take 0.25 mcg by mouth once daily  . cyanocobalamin ([employer] VITAMIN B12) 1000 MCG tablet Take 2,500 mcg by mouth daily.   Marland Kitchen diltiazem (CARDIZEM CD) 360 MG 24 hr capsule Take 360 mg by mouth once daily  . hydrochlorothiazide (HYDRODIURIL) 25 MG tablet TK 1 T PO D PRF SWELLING  . HYDROcodone-acetaminophen (NORCO) 5-325 MG tablet Take 1-2 tablets by mouth every 6 (six) hours as needed.  . insulin lispro (HUMALOG KWIKPEN) 100 UNIT/ML KiwkPen Inject 2-7 units SQ twice daily per sliding scale  . losartan (COZAAR) 100 MG tablet Take 100 mg by mouth at bedtime.   . metoprolol succinate (TOPROL-XL) 25 MG 24 hr tablet Take 12.5 mg by mouth at bedtime.   . ranitidine (ZANTAC) 150 MG tablet Take 150 mg by mouth 2 (two) times daily as needed for heartburn.  . rosuvastatin (CRESTOR) 10 MG tablet Take by mouth.  . sodium bicarbonate 650 MG tablet Take 650 mg by mouth twice daily  . tamsulosin (FLOMAX) 0.4 MG CAPS capsule Take 0.4 mg by mouth once daily  .  TOUJEO SOLOSTAR 300 UNIT/ML SOPN Inject 15 units SQ once daily  . VENTOLIN HFA 108 (90 Base) MCG/ACT inhaler Inhale 2 puffs by mouth 4 times daily as needed for shortness of breath or wheezing    Past Medical History:  Diagnosis Date  . Asthma   . CHF (congestive heart failure) ([place])   . Chronic kidney disease    stage IV  . Coronary artery disease   . Diabetes mellitus without complication ([place])   . GERD (gastroesophageal reflux disease)   . Hyperlipidemia   . Hypertension   . Myocardial infarction ([place])   . Seizures ([place])    none since 20"s    Past Surgical History:  Procedure Laterality Date  . CAPD INSERTION N/A 11/07/2017   Procedure: LAPAROSCOPIC INSERTION CONTINUOUS AMBULATORY PERITONEAL DIALYSIS  (CAPD) CATHETER;  Surgeon: Katha Cabal, MD;  Location: [facility] ORS;  Service: Vascular;  Laterality: N/A;  . CARDIAC SURGERY     stent placement  . CATARACT EXTRACTION, BILATERAL    . COLONOSCOPY    . CORONARY ANGIOPLASTY    . RETINAL DETACHMENT SURGERY      Social History Social History   Tobacco Use  . Smoking status: Light Tobacco Smoker  . Smokeless tobacco: Never Used  Substance Use Topics  . Alcohol use: No    Frequency: Never  . Drug use: No  Family History Family History  Problem Relation Age of Onset  . Heart disease Mother   . Diabetes Mother   . Diabetes Father     Allergies  Allergen Reactions  . Atorvastatin     Other reaction(s): Muscle Pain  . Carvedilol Other (See Comments)    Dizziness, drunk feeling  . Ciprofloxacin Rash  . Penicillins Rash and Other (See Comments)    Has patient had a PCN reaction causing immediate rash, facial/tongue/throat swelling, SOB or lightheadedness with hypotension: No Has patient had a PCN reaction causing severe rash involving mucus membranes or skin necrosis: No Has patient had a PCN reaction that required hospitalization: No Has patient had a PCN reaction occurring within the last 10 years: No If  all of the above answers are "NO", then may proceed with Cephalosporin use.   . Sulfa Antibiotics Rash  . Sulfasalazine Rash     REVIEW OF SYSTEMS (Negative unless checked)  Constitutional: [] Weight loss  [] Fever  [] Chills Cardiac: [] Chest pain   [] Chest pressure   [] Palpitations   [] Shortness of breath when laying flat   [] Shortness of breath with exertion. Vascular:  [] Pain in legs with walking   [] Pain in legs at rest  [] History of DVT   [] Phlebitis   [] Swelling in legs   [] Varicose veins   [] Non-healing ulcers Pulmonary:   [] Uses home oxygen   [] Productive cough   [] Hemoptysis   [] Wheeze  [] COPD   [] Asthma Neurologic:  [] Dizziness   [] Seizures   [] History of stroke   [] History of TIA  [] Aphasia   [] Vissual changes   [] Weakness or numbness in arm   [] Weakness or numbness in leg Musculoskeletal:   [] Joint swelling   [] Joint pain   [] Low back pain Hematologic:  [] Easy bruising  [] Easy bleeding   [] Hypercoagulable state   [] Anemic Gastrointestinal:  [] Diarrhea   [] Vomiting  [] Gastroesophageal reflux/heartburn   [] Difficulty swallowing. Genitourinary:  [] Chronic kidney disease   [] Difficult urination  [] Frequent urination   [] Blood in urine Skin:  [] Rashes   [] Ulcers  Psychological:  [] History of anxiety   []  History of major depression.  Physical Examination  Vitals:   12/01/17 0909  BP: 131/67  Pulse: (!) 55  Resp: 14  Weight: 154 lb (69.9 kg)  Height: 5\' 8"  (1.727 m)   Body mass index is 23.42 kg/m. Gen: WD/WN, NAD Head: [place]/AT, No temporalis wasting.  Ear/Nose/Throat: Hearing grossly intact, nares w/o erythema or drainage Eyes: PER, EOMI, sclera nonicteric.  Neck: Supple, no large masses.   Pulmonary:  Good air movement, no audible wheezing bilaterally, no use of accessory muscles.  Cardiac: RRR, no JVD Vascular:  PD cath CD&I Vessel Right Left  Radial Palpable Palpable  Ulnar Palpable Palpable  Brachial Palpable Palpable  Gastrointestinal: Non-distended. No guarding/no  peritoneal signs.  Musculoskeletal: M/S 5/5 throughout.  No deformity or atrophy.  Neurologic: CN 2-12 intact. Symmetrical.  Speech is fluent. Motor exam as listed above. Psychiatric: Judgment intact, Mood & affect appropriate for pt's clinical situation. Dermatologic: No rashes or ulcers noted.  No changes consistent with cellulitis. Lymph : No lichenification or skin changes of chronic lymphedema.  CBC Lab Results  Component Value Date   WBC 7.4 11/03/2017   HGB 9.9 (L) 11/07/2017   HCT 29.0 (L) 11/07/2017   MCV 94.6 11/03/2017   PLT 232 11/03/2017    BMET    Component Value Date/Time   NA 141 11/07/2017 0659   K 4.1 11/07/2017 0659   CL 107 11/03/2017  1210   CO2 23 11/03/2017 1210   GLUCOSE 113 (H) 11/07/2017 0659   BUN 51 (H) 11/03/2017 1210   CREATININE 3.95 (H) 11/03/2017 1210   CALCIUM 8.5 (L) 11/03/2017 1210   GFRNONAA 14 (L) 11/03/2017 1210   GFRAA 16 (L) 11/03/2017 1210   CrCl cannot be calculated (Patient's most recent lab result is older than the maximum 21 days allowed.).  COAG Lab Results  Component Value Date   INR 1.03 11/03/2017    Radiology No results found.  Assessment/Plan 1. Stage 5 chronic kidney disease not on chronic dialysis [facility]) Recommend:  The patient is doing well and currently has adequate dialysis access. The patient's dialysis center is not reporting any access issues. Flow pattern is stable   The patient will follow-up with me PRN    2. Type 2 diabetes mellitus with complication, unspecified whether long term insulin use (HCC) Continue hypoglycemic medications as already ordered, these medications have been reviewed and there are no changes at this time.  Hgb A1C to be monitored as already arranged by primary service   3. Essential hypertension Continue antihypertensive medications as already ordered, these medications have been reviewed and there are no changes at this time.   4. Hyperlipidemia, unspecified  hyperlipidemia type Continue statin as ordered and reviewed, no changes at this time     Hortencia Pilar, MD  12/01/2017 9:28 AM

## 2017-12-07 ENCOUNTER — Encounter (INDEPENDENT_AMBULATORY_CARE_PROVIDER_SITE_OTHER): Payer: Self-pay | Admitting: Vascular Surgery

## 2017-12-18 ENCOUNTER — Ambulatory Visit
Admission: RE | Admit: 2017-12-18 | Discharge: 2017-12-18 | Disposition: A | Discharge status: Home / Self Care | Payer: Medicare Other | Source: Ambulatory Visit | Attending: Family Medicine | Admitting: Family Medicine

## 2017-12-18 ENCOUNTER — Other Ambulatory Visit: Payer: Self-pay | Admitting: Family Medicine

## 2017-12-18 DIAGNOSIS — R05 Cough: Secondary | ICD-10-CM | POA: Insufficient documentation

## 2017-12-18 DIAGNOSIS — R059 Cough, unspecified: Secondary | ICD-10-CM

## 2018-01-02 ENCOUNTER — Ambulatory Visit
Admission: RE | Admit: 2018-01-02 | Discharge: 2018-01-02 | Disposition: A | Discharge status: Home / Self Care | Payer: Medicare Other | Source: Ambulatory Visit | Attending: Nephrology | Admitting: Nephrology

## 2018-01-02 ENCOUNTER — Other Ambulatory Visit: Payer: Self-pay | Admitting: Nephrology

## 2018-01-02 DIAGNOSIS — T85611A Breakdown (mechanical) of intraperitoneal dialysis catheter, initial encounter: Secondary | ICD-10-CM

## 2018-01-02 DIAGNOSIS — Y838 Other surgical procedures as the cause of abnormal reaction of the patient, or of later complication, without mention of misadventure at the time of the procedure: Secondary | ICD-10-CM | POA: Insufficient documentation

## 2018-03-03 ENCOUNTER — Other Ambulatory Visit (INDEPENDENT_AMBULATORY_CARE_PROVIDER_SITE_OTHER): Payer: Self-pay

## 2018-08-06 ENCOUNTER — Ambulatory Visit
Admission: RE | Admit: 2018-08-06 | Discharge: 2018-08-06 | Disposition: A | Discharge status: Home / Self Care | Payer: Medicare Other | Source: Ambulatory Visit | Attending: Nephrology | Admitting: Nephrology

## 2018-08-06 ENCOUNTER — Other Ambulatory Visit: Payer: Self-pay | Admitting: Nephrology

## 2018-08-06 DIAGNOSIS — T85611A Breakdown (mechanical) of intraperitoneal dialysis catheter, initial encounter: Secondary | ICD-10-CM | POA: Insufficient documentation

## 2018-08-06 DIAGNOSIS — Y812 Prosthetic and other implants, materials and accessory general- and plastic-surgery devices associated with adverse incidents: Secondary | ICD-10-CM | POA: Diagnosis not present

## 2018-12-17 IMAGING — CR DG CHEST 2V
3 series · 3 of 3 positions shown · non-contrast
Comparison: Chest radiograph 02/28/2014

CLINICAL DATA: Cough, congestion, wheezing, shortness of breath

EXAM:
CHEST  2 VIEW

[chest pa (1 of 2)]
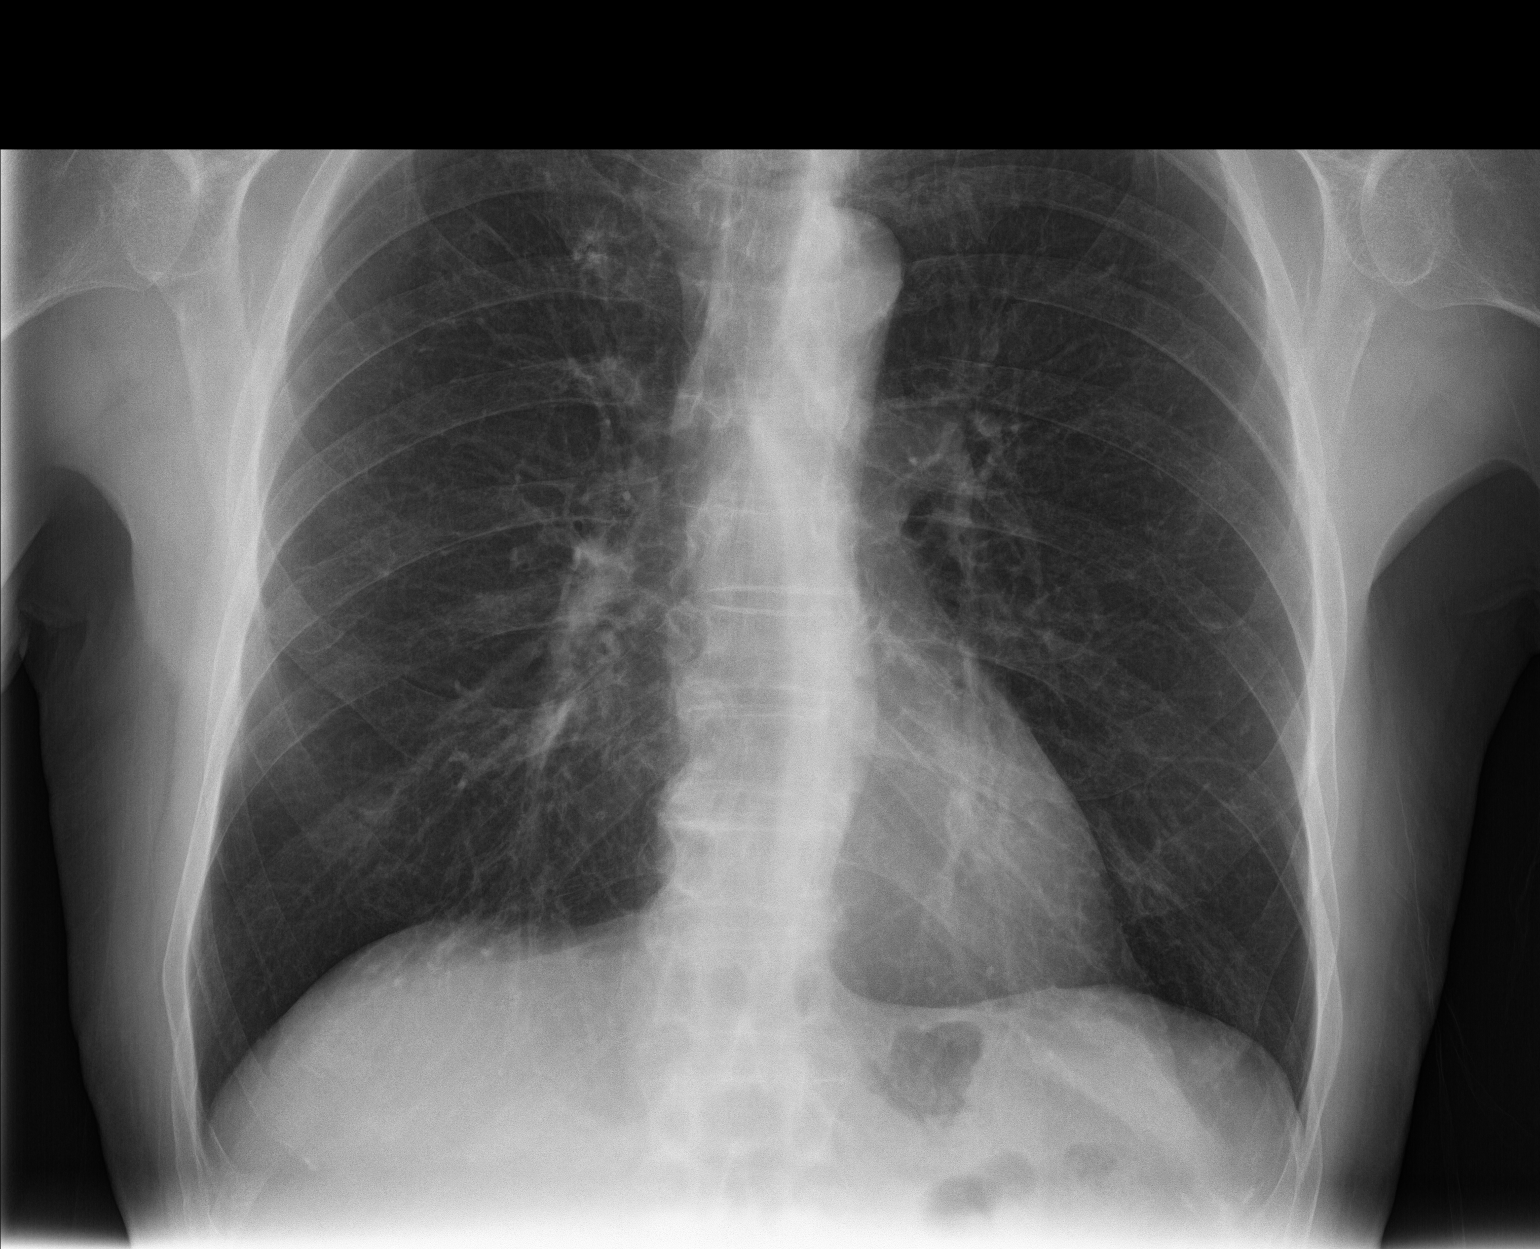

[chest pa (2 of 2)]
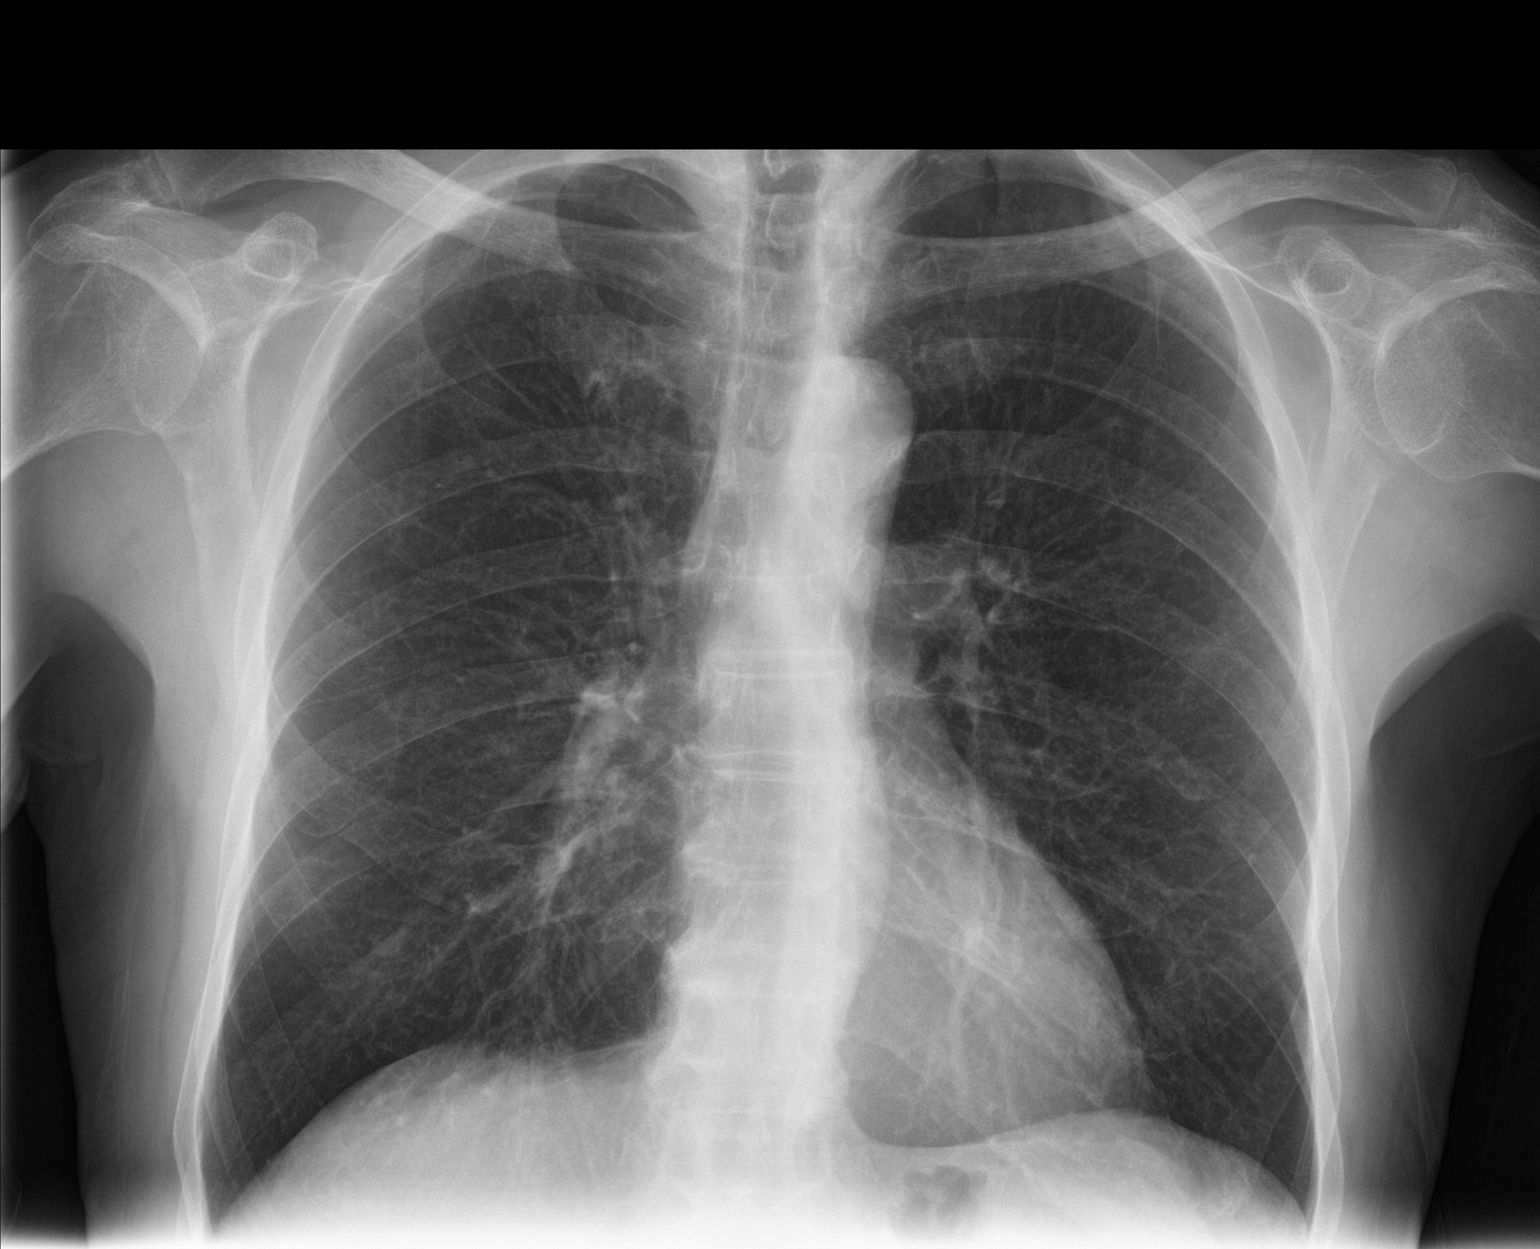

[chest lat]
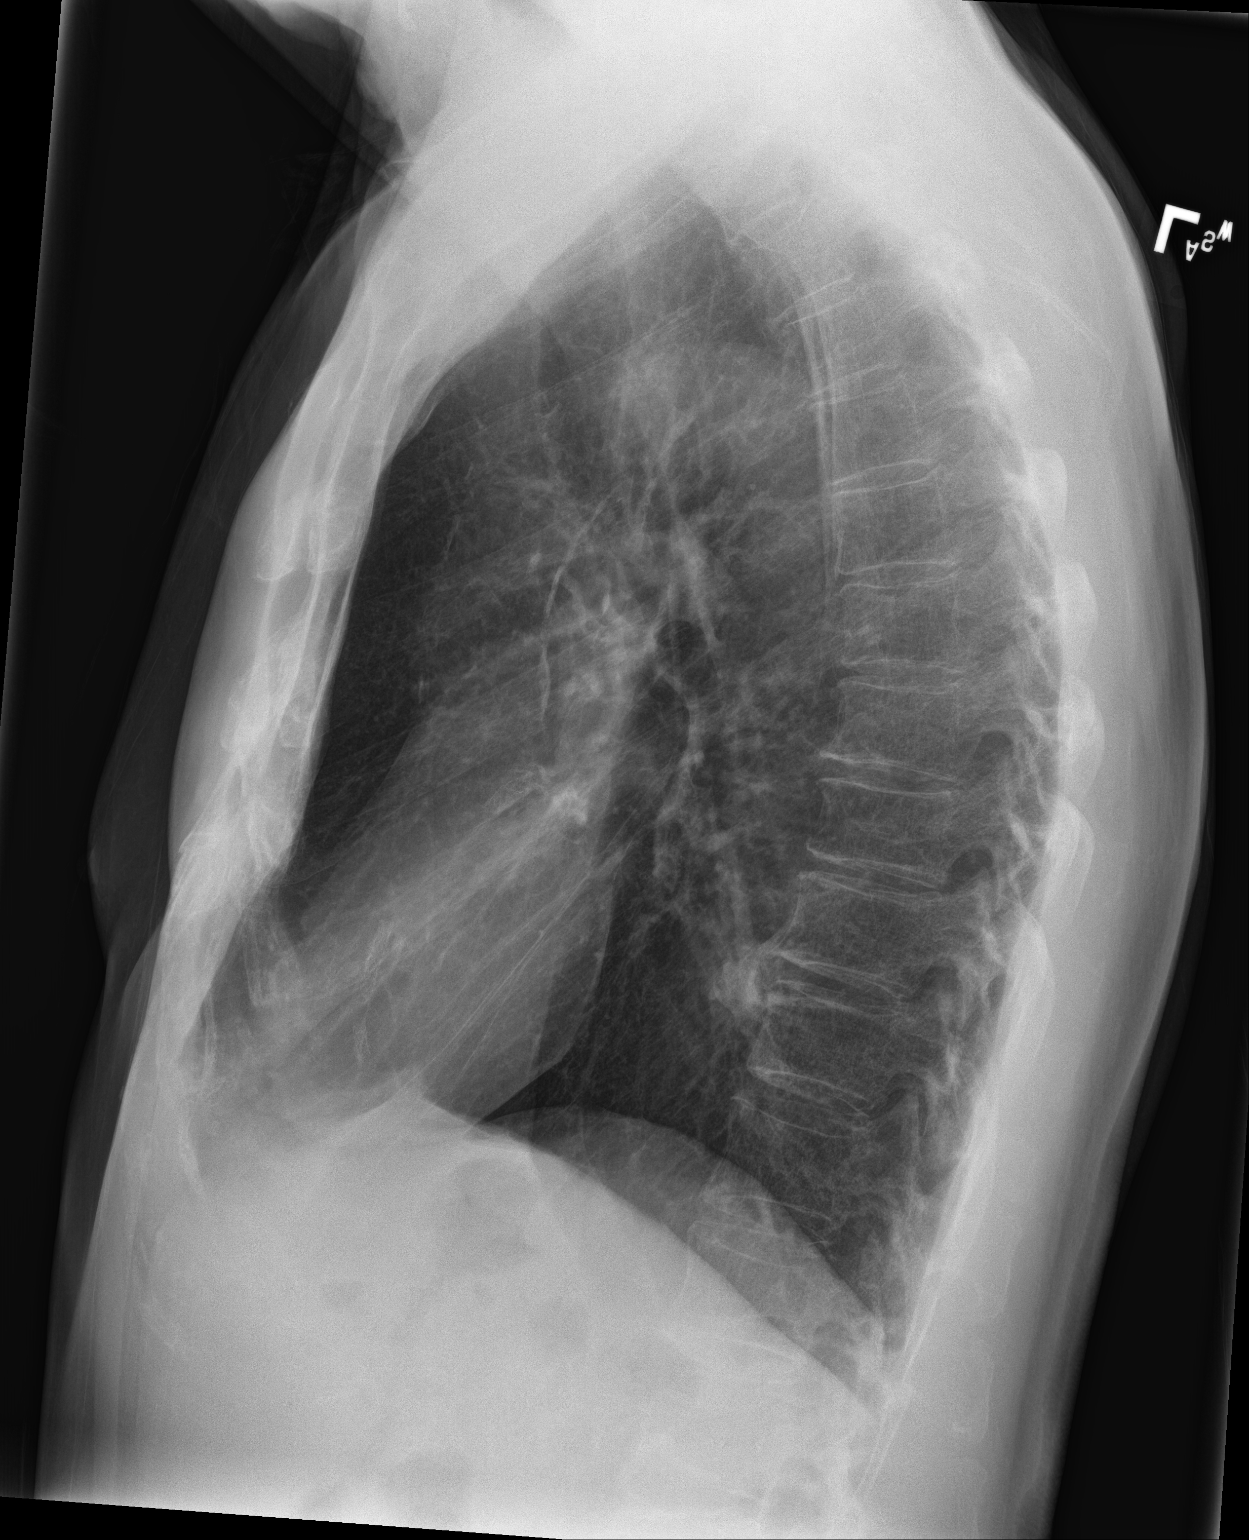

[3 of 3 positions shown; findings below may reference images not displayed]

FINDINGS: Normal cardiac and mediastinal contours. Suspect nipple shadow
overlying the right lower hemithorax. No large area pulmonary
consolidation. No pleural effusion or pneumothorax. Regional
skeleton unremarkable.
IMPRESSION: No acute cardiopulmonary process.

## 2019-01-01 IMAGING — CR DG ABDOMEN 1V
1 series · 2 of 2 positions shown · non-contrast
Comparison: None.

CLINICAL DATA: Peritoneal dialysis catheter dysfunction.

EXAM:
ABDOMEN - 1 VIEW

[Series 1: dg abd 1 view · 0.14mm/px · 2 of 2 slices shown]
[im 1/2]
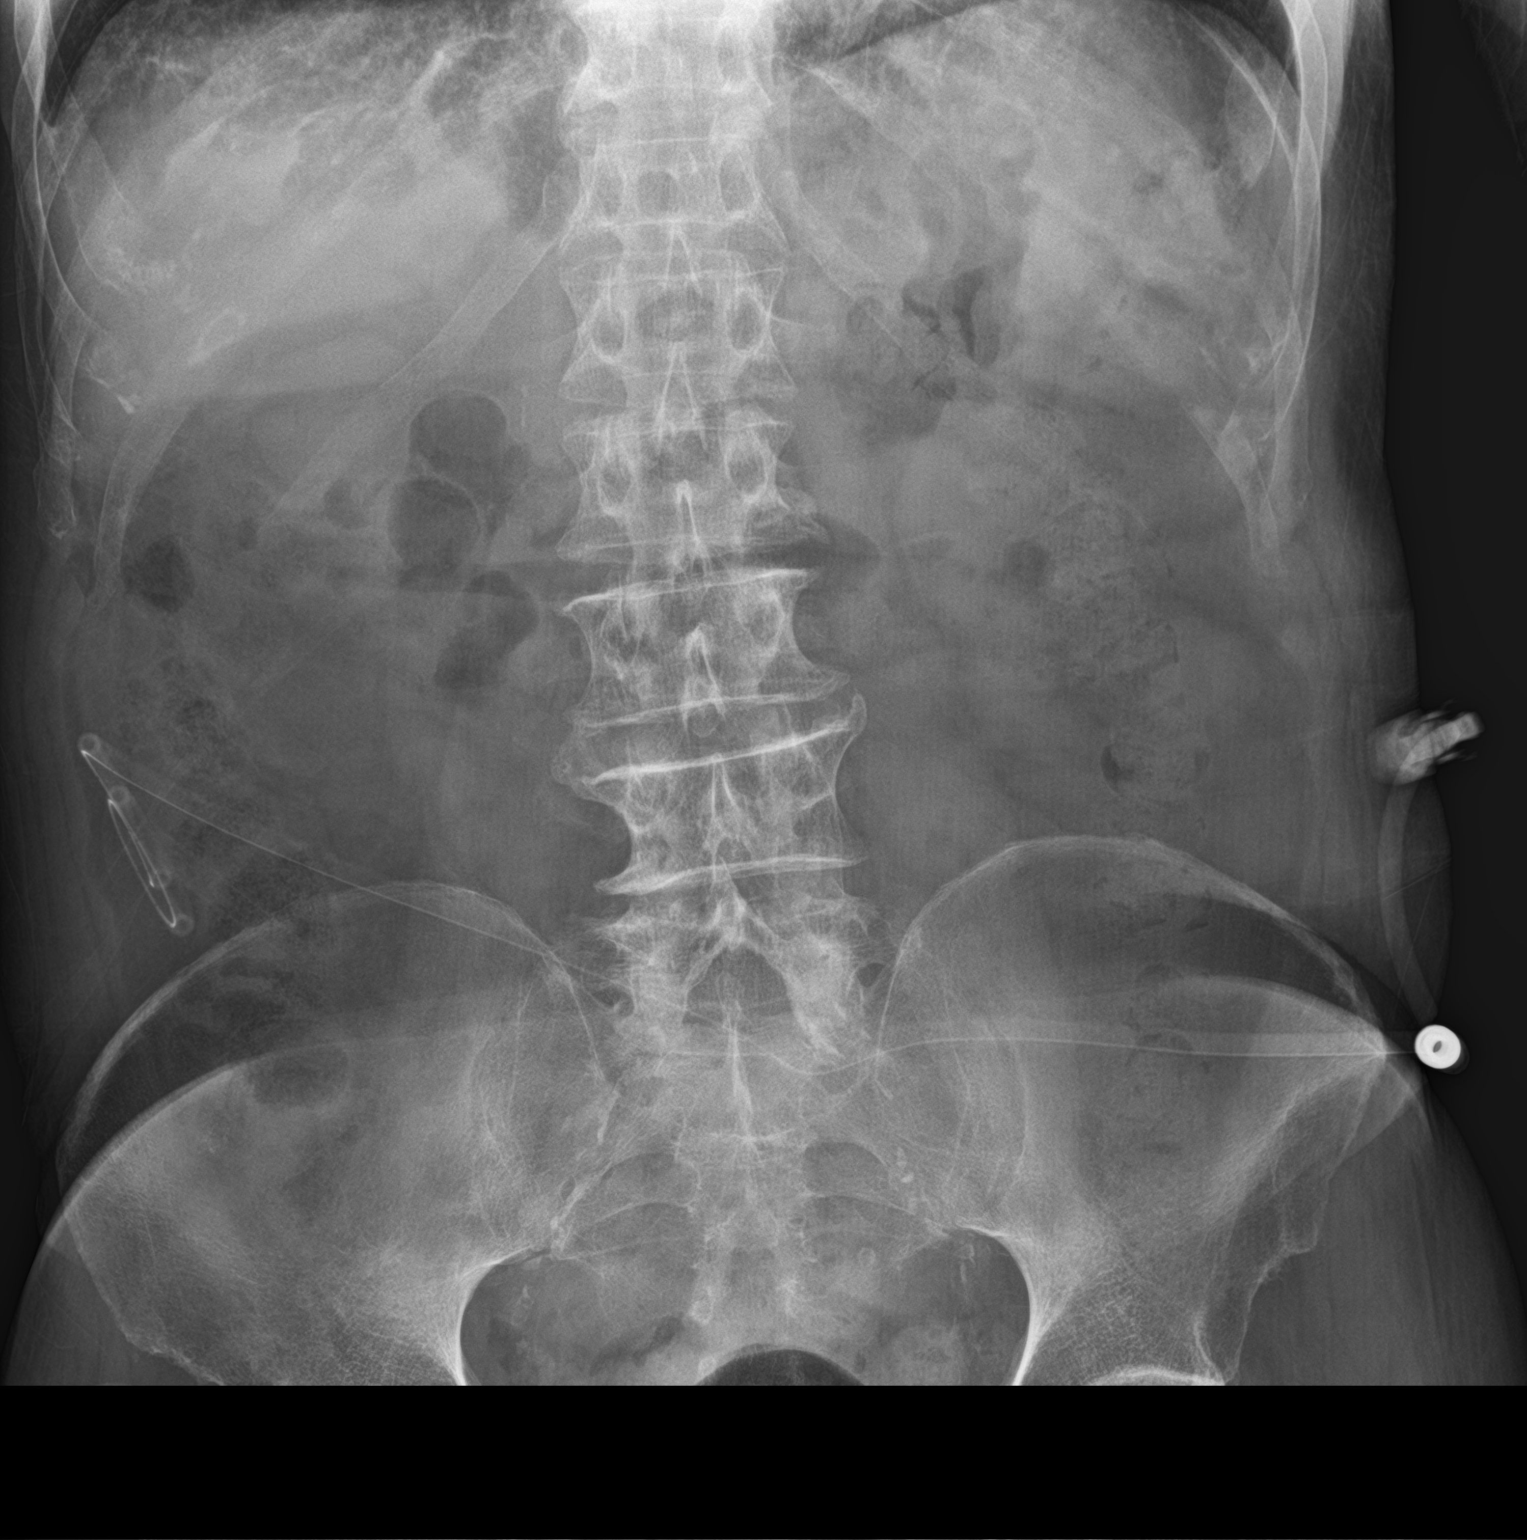
[im 2/2]
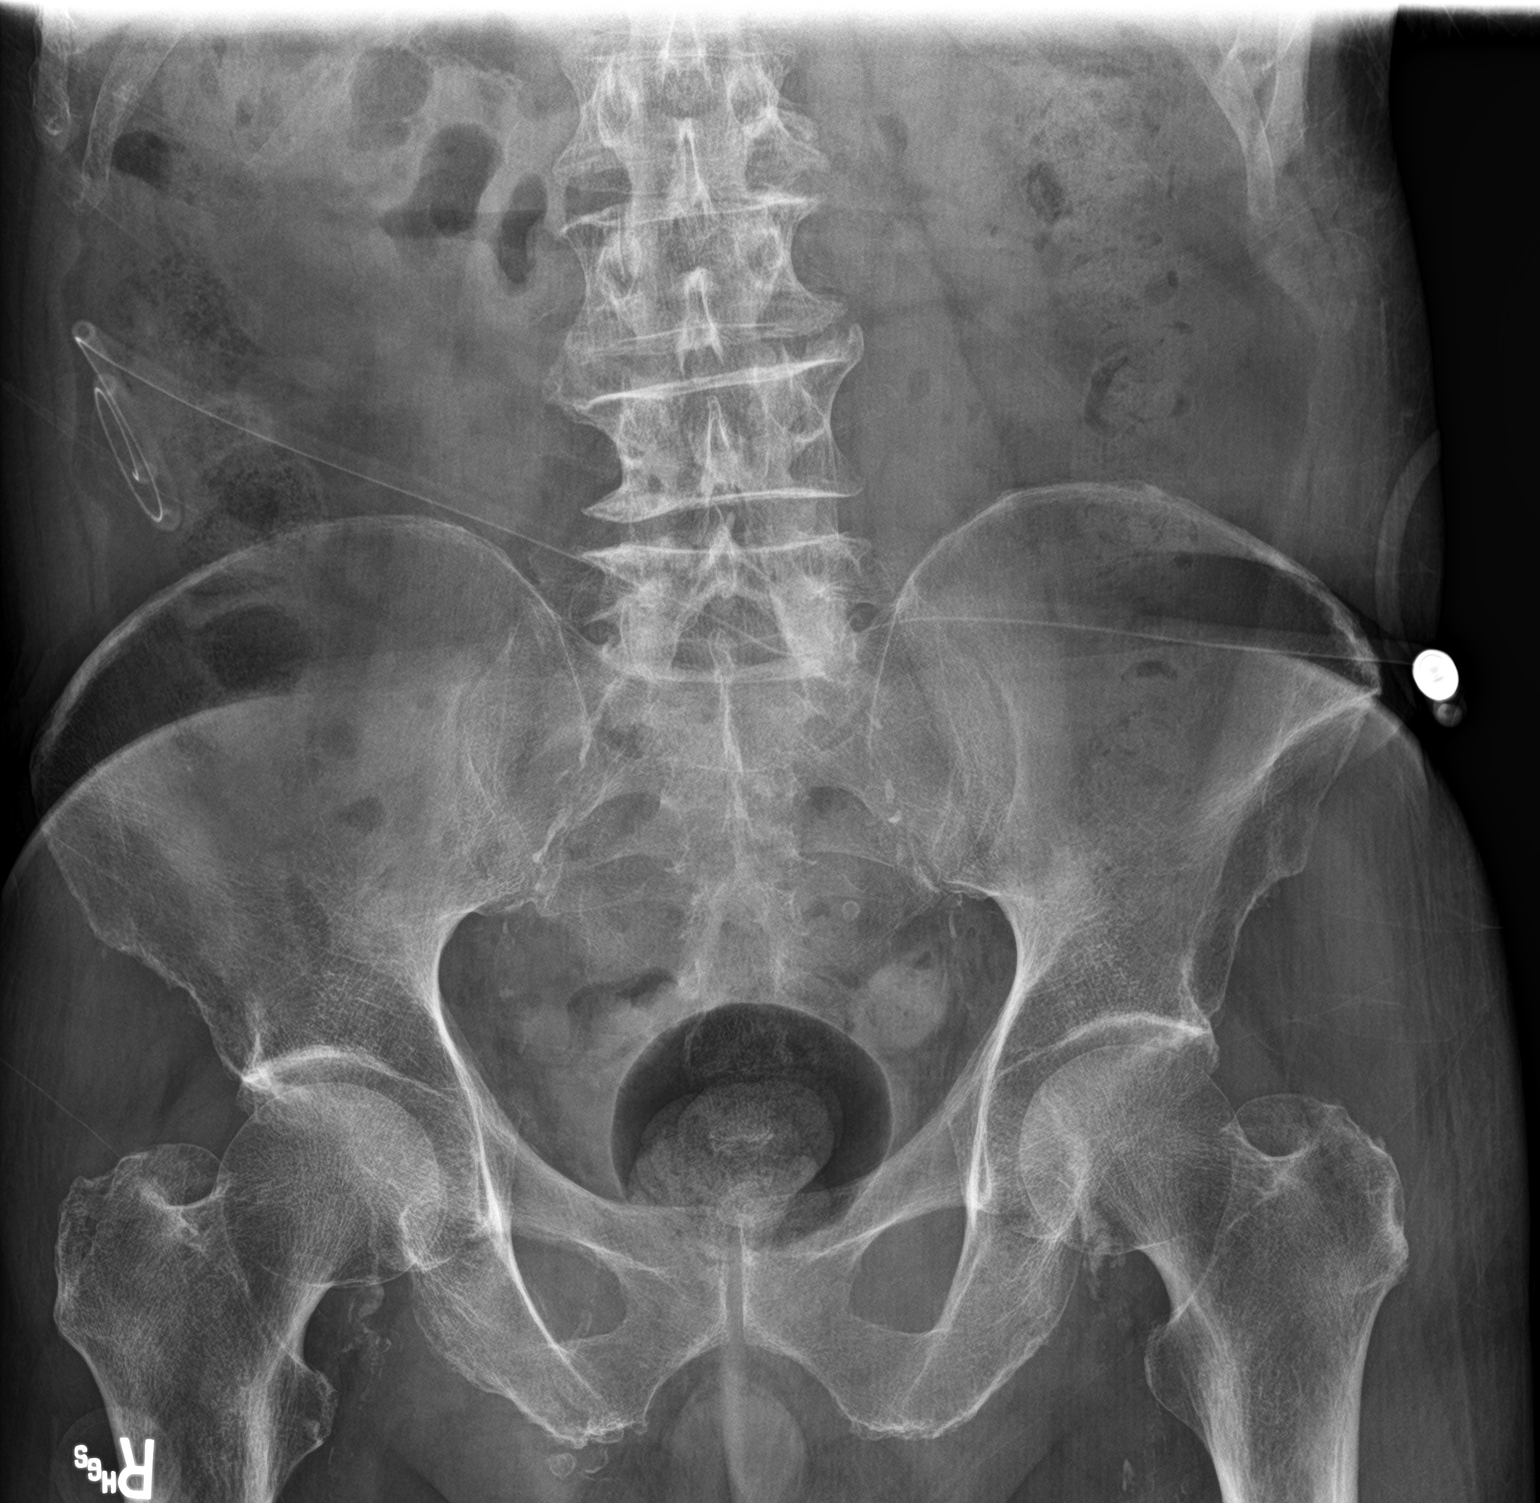

[2 of 2 positions shown; findings below may reference images not displayed]

FINDINGS: Peritoneal dialysis catheter is coiled in the right lateral abdomen.
No kinking or catheter fracture.

Normal bowel gas pattern. Mild stool in the colon. No renal calculi.
No acute skeletal abnormality.
IMPRESSION: Peritoneal dialysis catheter coiled in the right abdomen without
radiographic abnormality.

## 2019-06-04 ENCOUNTER — Ambulatory Visit
Admission: RE | Admit: 2019-06-04 | Discharge: 2019-06-04 | Disposition: A | Discharge status: Home / Self Care | Payer: Medicare Other | Source: Ambulatory Visit | Attending: Family Medicine | Admitting: Family Medicine

## 2019-06-04 ENCOUNTER — Encounter (INDEPENDENT_AMBULATORY_CARE_PROVIDER_SITE_OTHER): Payer: Self-pay

## 2019-06-04 ENCOUNTER — Other Ambulatory Visit: Payer: Self-pay

## 2019-06-04 ENCOUNTER — Ambulatory Visit
Admission: RE | Admit: 2019-06-04 | Discharge: 2019-06-04 | Disposition: A | Discharge status: Home / Self Care | Payer: Medicare Other | Attending: Family Medicine | Admitting: Family Medicine

## 2019-06-04 ENCOUNTER — Other Ambulatory Visit: Payer: Self-pay | Admitting: Family Medicine

## 2019-06-04 DIAGNOSIS — R062 Wheezing: Secondary | ICD-10-CM | POA: Diagnosis present

## 2019-08-05 IMAGING — CR DG ABDOMEN 1V
1 series · 2 of 2 positions shown · non-contrast
Comparison: 01/02/2018

CLINICAL DATA: Peritoneal dialysis catheter dysfunction. Initial
encounter.

EXAM:
ABDOMEN - 1 VIEW

[Series 1: dg abd 1 view · 0.14mm/px · 2 of 2 slices shown]
[im 1/2]
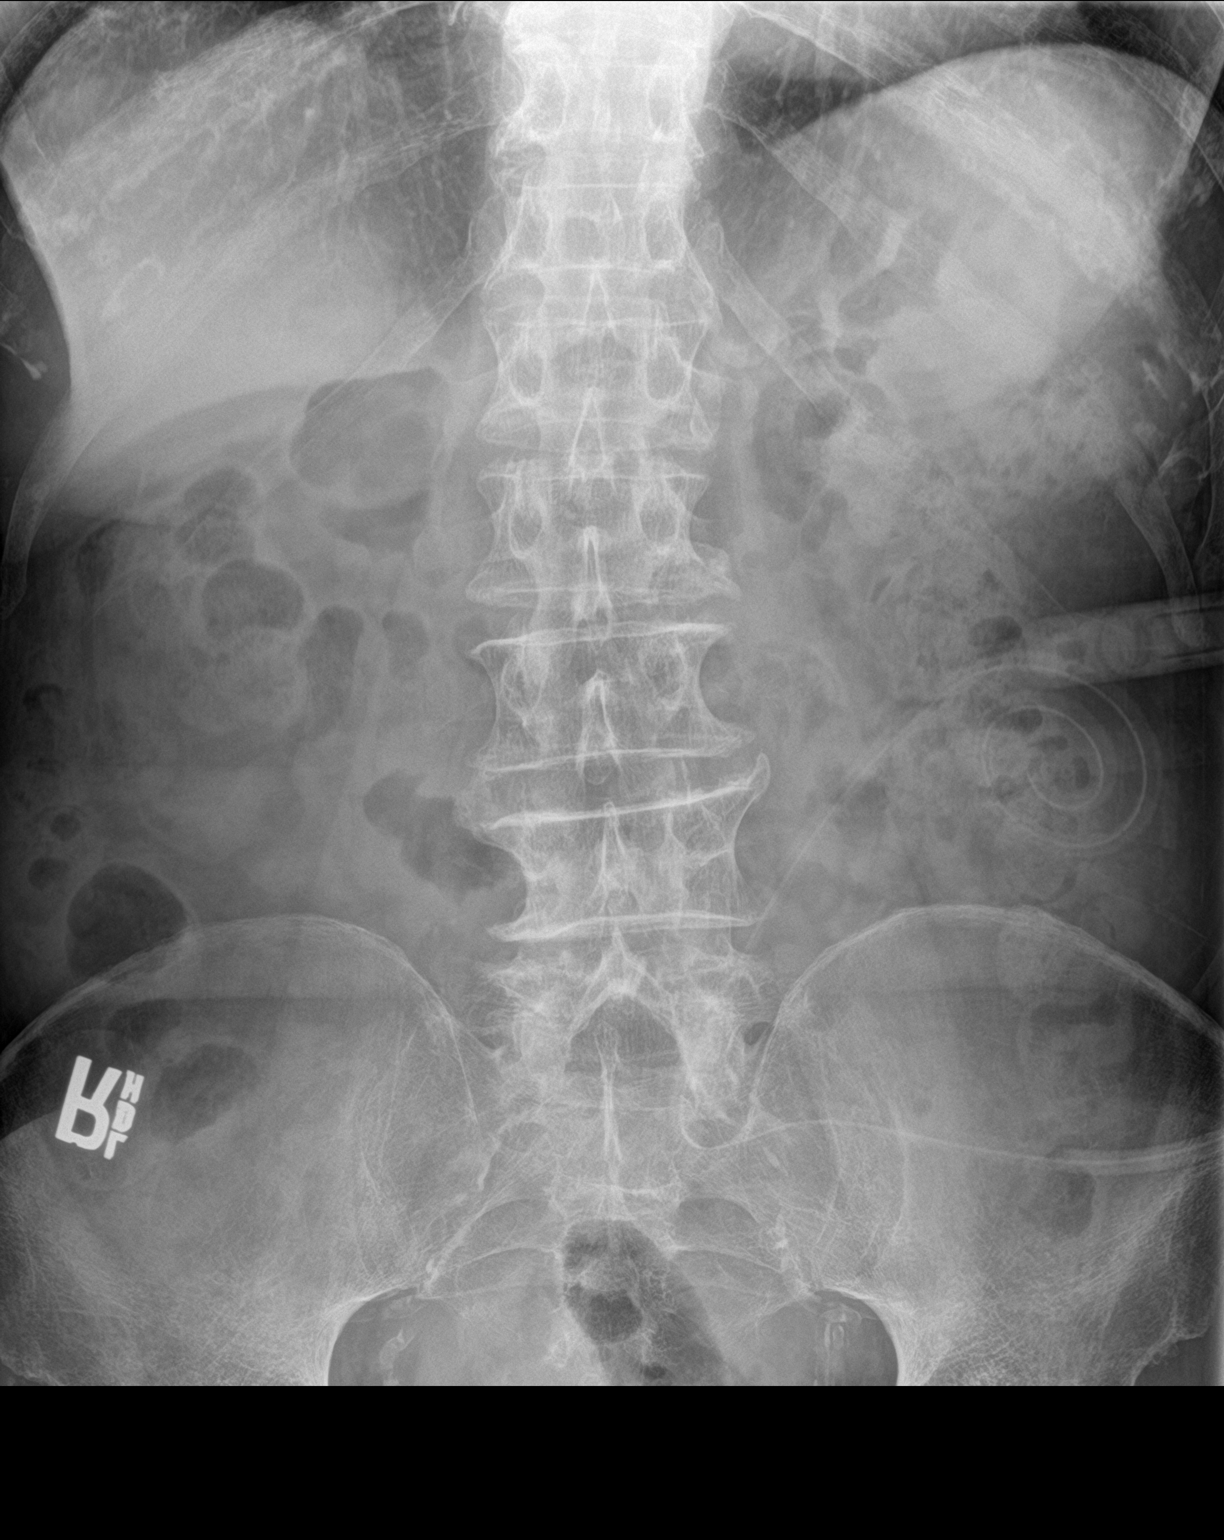
[im 2/2]
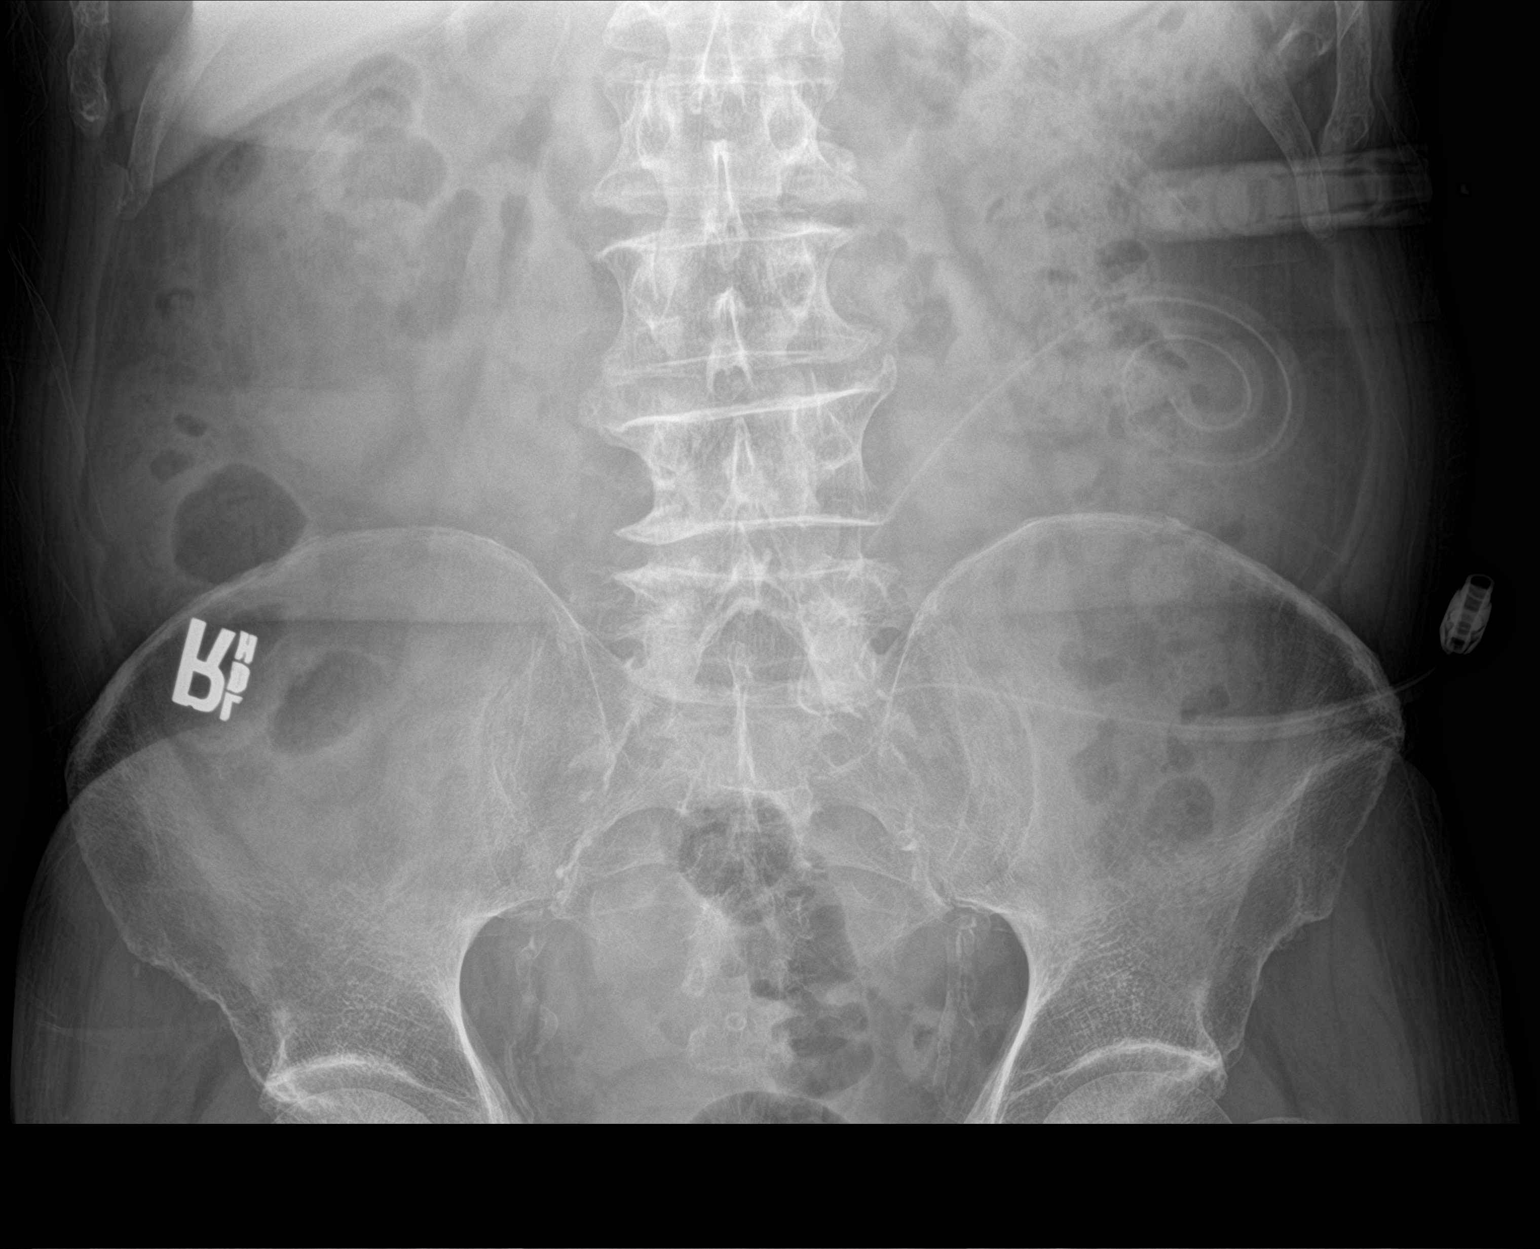

[2 of 2 positions shown; findings below may reference images not displayed]

FINDINGS: The bowel gas pattern is normal. Peritoneal dialysis catheter is now
seen in the left abdomen. Iliac vascular calcification noted in the
pelvis.
IMPRESSION: No acute findings.

## 2020-11-22 ENCOUNTER — Ambulatory Visit
Admission: RE | Admit: 2020-11-22 | Discharge: 2020-11-22 | Disposition: A | Discharge status: Home / Self Care | Payer: Medicare Other | Attending: Family Medicine | Admitting: Family Medicine

## 2020-11-22 ENCOUNTER — Other Ambulatory Visit: Payer: Self-pay | Admitting: Family Medicine

## 2020-11-22 ENCOUNTER — Ambulatory Visit
Admission: RE | Admit: 2020-11-22 | Discharge: 2020-11-22 | Disposition: A | Discharge status: Home / Self Care | Payer: Medicare Other | Source: Ambulatory Visit | Attending: Family Medicine | Admitting: Family Medicine

## 2020-11-22 ENCOUNTER — Other Ambulatory Visit: Payer: Self-pay

## 2020-11-22 DIAGNOSIS — W19XXXA Unspecified fall, initial encounter: Secondary | ICD-10-CM

## 2020-11-22 DIAGNOSIS — S300XXA Contusion of lower back and pelvis, initial encounter: Secondary | ICD-10-CM | POA: Diagnosis present

## 2020-11-22 DIAGNOSIS — M1612 Unilateral primary osteoarthritis, left hip: Secondary | ICD-10-CM | POA: Diagnosis not present

## 2021-05-11 ENCOUNTER — Other Ambulatory Visit: Payer: Self-pay

## 2021-05-11 ENCOUNTER — Encounter: Payer: Self-pay | Admitting: Internal Medicine

## 2021-05-11 ENCOUNTER — Inpatient Hospital Stay: Payer: Medicare Other

## 2021-05-11 ENCOUNTER — Telehealth: Payer: Self-pay | Admitting: Internal Medicine

## 2021-05-11 ENCOUNTER — Inpatient Hospital Stay: Payer: Medicare Other | Attending: Internal Medicine | Admitting: Internal Medicine

## 2021-05-11 DIAGNOSIS — I132 Hypertensive heart and chronic kidney disease with heart failure and with stage 5 chronic kidney disease, or end stage renal disease: Secondary | ICD-10-CM

## 2021-05-11 DIAGNOSIS — D631 Anemia in chronic kidney disease: Secondary | ICD-10-CM | POA: Insufficient documentation

## 2021-05-11 DIAGNOSIS — E1122 Type 2 diabetes mellitus with diabetic chronic kidney disease: Secondary | ICD-10-CM | POA: Insufficient documentation

## 2021-05-11 DIAGNOSIS — N186 End stage renal disease: Secondary | ICD-10-CM | POA: Diagnosis not present

## 2021-05-11 DIAGNOSIS — D649 Anemia, unspecified: Secondary | ICD-10-CM

## 2021-05-11 DIAGNOSIS — N185 Chronic kidney disease, stage 5: Secondary | ICD-10-CM

## 2021-05-11 DIAGNOSIS — Z992 Dependence on renal dialysis: Secondary | ICD-10-CM | POA: Diagnosis not present

## 2021-05-11 DIAGNOSIS — D509 Iron deficiency anemia, unspecified: Secondary | ICD-10-CM | POA: Diagnosis not present

## 2021-05-11 DIAGNOSIS — F17210 Nicotine dependence, cigarettes, uncomplicated: Secondary | ICD-10-CM | POA: Diagnosis not present

## 2021-05-11 LAB — CBC WITH DIFFERENTIAL/PLATELET
Abs Immature Granulocytes: 0.05 10*3/uL (ref 0.00–0.07)
Basophils Absolute: 0.1 10*3/uL (ref 0.0–0.1)
Basophils Relative: 1 %
Eosinophils Absolute: 1.7 10*3/uL — ABNORMAL HIGH (ref 0.0–0.5)
Eosinophils Relative: 16 %
HCT: 19.3 % — ABNORMAL LOW (ref 39.0–52.0)
Hemoglobin: 6.3 g/dL — ABNORMAL LOW (ref 13.0–17.0)
Immature Granulocytes: 1 %
Lymphocytes Relative: 8 %
Lymphs Abs: 0.9 10*3/uL (ref 0.7–4.0)
MCH: 32.3 pg (ref 26.0–34.0)
MCHC: 32.6 g/dL (ref 30.0–36.0)
MCV: 99 fL (ref 80.0–100.0)
Monocytes Absolute: 1.1 10*3/uL — ABNORMAL HIGH (ref 0.1–1.0)
Monocytes Relative: 10 %
Neutro Abs: 6.9 10*3/uL (ref 1.7–7.7)
Neutrophils Relative %: 64 %
Platelets: 273 10*3/uL (ref 150–400)
RBC: 1.95 MIL/uL — ABNORMAL LOW (ref 4.22–5.81)
RDW: 13.2 % (ref 11.5–15.5)
WBC: 10.7 10*3/uL — ABNORMAL HIGH (ref 4.0–10.5)
nRBC: 0 % (ref 0.0–0.2)

## 2021-05-11 LAB — IRON AND TIBC
Iron: 38 ug/dL — ABNORMAL LOW (ref 45–182)
Saturation Ratios: 16 % — ABNORMAL LOW (ref 17.9–39.5)
TIBC: 244 ug/dL — ABNORMAL LOW (ref 250–450)
UIBC: 206 ug/dL

## 2021-05-11 LAB — COMPREHENSIVE METABOLIC PANEL
ALT: 21 U/L (ref 0–44)
AST: 17 U/L (ref 15–41)
Albumin: 2.4 g/dL — ABNORMAL LOW (ref 3.5–5.0)
Alkaline Phosphatase: 57 U/L (ref 38–126)
Anion gap: 9 (ref 5–15)
BUN: 57 mg/dL — ABNORMAL HIGH (ref 8–23)
CO2: 30 mmol/L (ref 22–32)
Calcium: 8.1 mg/dL — ABNORMAL LOW (ref 8.9–10.3)
Chloride: 97 mmol/L — ABNORMAL LOW (ref 98–111)
Creatinine, Ser: 6.03 mg/dL — ABNORMAL HIGH (ref 0.61–1.24)
GFR, Estimated: 9 mL/min — ABNORMAL LOW (ref >60)
Glucose, Bld: 122 mg/dL — ABNORMAL HIGH (ref 70–99)
Potassium: 3.3 mmol/L — ABNORMAL LOW (ref 3.5–5.1)
Sodium: 136 mmol/L (ref 135–145)
Total Bilirubin: 0.3 mg/dL (ref 0.3–1.2)
Total Protein: 6.5 g/dL (ref 6.5–8.1)

## 2021-05-11 LAB — RETICULOCYTES
Immature Retic Fract: 21.2 % — ABNORMAL HIGH (ref 2.3–15.9)
RBC.: 1.98 MIL/uL — ABNORMAL LOW (ref 4.22–5.81)
Retic Count, Absolute: 48.5 10*3/uL (ref 19.0–186.0)
Retic Ct Pct: 2.5 % (ref 0.4–3.1)

## 2021-05-11 LAB — LACTATE DEHYDROGENASE: LDH: 217 U/L — ABNORMAL HIGH (ref 98–192)

## 2021-05-11 LAB — FERRITIN: Ferritin: 816 ng/mL — ABNORMAL HIGH (ref 24–336)

## 2021-05-11 NOTE — Telephone Encounter (Signed)
On 6/24-I spoke to patient's wife Manuel Klein that patient's hemoglobin 6.3.  However patient is clinically stable.  Recommend PRBC transfusion in [place] on 6/28.   Please schedule and inform wife- 6/28-labs- hold tube/ and 1 unit of PRBC.   Discussed with Dr.Lateef-okay to proceed with EPO/iron infusions-given benefits outweighing the risk.  GB

## 2021-05-11 NOTE — Progress Notes (Signed)
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  male has been referred to [place] for further recommendations regarding continuation of EPO in the context of his new diagnosis of right RCC.  Patient has end-stage renal disease on peritoneal dialysis.   However more recently; incidentally noted to have right-sided RCC.  Currently s/p ablation at [facility].  Given the recent diagnosis of malignancy patient has been referred to hematology oncology regarding discussion of continuation of EPO..   Blood in stools: None Change in bowel habits- None Blood in urine: None Difficulty swallowing: None Abnormal weight loss: None  Review of Systems  Constitutional:  Positive for malaise/fatigue. Negative for chills, diaphoresis, fever and weight loss.  HENT:  Negative for nosebleeds and sore throat.   Eyes:  Negative for double vision.  Respiratory:  Positive for shortness of breath. Negative for cough, hemoptysis, sputum production and wheezing.   Cardiovascular:  Positive for leg swelling. Negative for chest pain, palpitations and orthopnea.  Gastrointestinal:  Negative for abdominal pain, blood in stool, constipation, diarrhea, heartburn, melena, nausea and vomiting.  Genitourinary:  Negative for dysuria, frequency and urgency.  Musculoskeletal:  Positive for back pain and joint pain.  Skin: Negative.  Negative for itching and rash.  Neurological:  Negative for dizziness, tingling, focal weakness, weakness and headaches.  Endo/Heme/Allergies:  Does not bruise/bleed easily.  Psychiatric/Behavioral:   Negative for depression. The patient is not nervous/anxious and does not have insomnia.    MEDICAL HISTORY:  Past Medical History:  Diagnosis Date   Asthma    CHF (congestive heart failure) ([place])    Chronic kidney disease    stage IV   Coronary artery disease    Diabetes mellitus without complication (HCC)    GERD (gastroesophageal reflux disease)    Hyperlipidemia    Hypertension    Myocardial infarction ([place])    Seizures ([place])    none since 20"s    SURGICAL HISTORY: Past Surgical History:  Procedure Laterality Date   CAPD INSERTION N/A 11/07/2017   Procedure: LAPAROSCOPIC INSERTION CONTINUOUS AMBULATORY PERITONEAL DIALYSIS  (CAPD) CATHETER;  Surgeon: Katha Cabal, MD;  Location: [facility] ORS;  Service: Vascular;  Laterality: N/A;   CARDIAC SURGERY     stent placement   CATARACT EXTRACTION, BILATERAL     COLONOSCOPY     CORONARY ANGIOPLASTY     RETINAL DETACHMENT SURGERY      SOCIAL HISTORY: Social History   Socioeconomic History   Marital status: Divorced    Spouse name: Not on file   Number of children: Not on file   Years of education: Not on file   Highest education level: Not on file  Occupational History   Not on file  Tobacco Use   Smoking status: Light Smoker    Pack years: 0.00   Smokeless tobacco: Never  Vaping Use   Vaping Use: Never used  Substance and Sexual Activity   Alcohol use: No   Drug use: No   Sexual activity: Not on file  Other Topics Concern   Not on file  Social History Narrative   Not on file   Social Determinants  of Health   Financial Resource Strain: Not on file  Food Insecurity: Not on file  Transportation Needs: Not on file  Physical Activity: Not on file  Stress: Not on file  Social Connections: Not on file  Intimate Partner Violence: Not on file    FAMILY HISTORY: Family History  Problem Relation Age of Onset   Heart disease Mother    Diabetes Mother    Diabetes Father     ALLERGIES:  is allergic to  hydralazine, atorvastatin, carvedilol, ciprofloxacin, other, penicillins, sulfa antibiotics, and sulfasalazine.  MEDICATIONS:  Current Outpatient Medications  Medication Sig Dispense Refill   amLODipine (NORVASC) 5 MG tablet Take by mouth.     aspirin 81 MG tablet Take 81 mg by mouth daily.      B-D UF III MINI PEN NEEDLES 31G X 5 MM MISC See admin instructions.     calcitRIOL (ROCALTROL) 0.25 MCG capsule Take 0.25 mcg by mouth once daily     calcium elemental as carbonate (BARIATRIC TUMS ULTRA) 400 MG chewable tablet Chew by mouth.     CONTOUR NEXT TEST test strip 3 (three) times daily.     cyanocobalamin 1000 MCG tablet Take 2,500 mcg by mouth daily.      Cyanocobalamin 5000 MCG CAPS Take by mouth.     diltiazem (TIAZAC) 360 MG 24 hr capsule Take by mouth.     fluticasone (FLONASE) 50 MCG/ACT nasal spray Place into the nose.     furosemide (LASIX) 40 MG tablet      gentamicin cream (GARAMYCIN) 0.1 % Apply topically.     glucagon 1 MG injection See admin instructions.     HUMULIN N KWIKPEN 100 UNIT/ML Kiwkpen SMARTSIG:16 Unit(s) SUB-Q Every Night     hydrALAZINE (APRESOLINE) 25 MG tablet Take 25 mg by mouth 2 (two) times daily.     insulin lispro (HUMALOG) 100 UNIT/ML KiwkPen Inject 2-7 units SQ twice daily per sliding scale     levothyroxine (SYNTHROID) 100 MCG tablet Take 100 mcg by mouth daily.     losartan (COZAAR) 100 MG tablet Take 100 mg by mouth at bedtime.      magnesium oxide (MAG-OX) 400 MG tablet Take 1 tablet by mouth daily.     metoprolol succinate (TOPROL-XL) 25 MG 24 hr tablet Take 12.5 mg by mouth at bedtime.   11   metoprolol tartrate (LOPRESSOR) 25 MG tablet Take 25 mg by mouth 2 (two) times daily.     nitroGLYCERIN (NITROSTAT) 0.4 MG SL tablet Place under the tongue.     rosuvastatin (CRESTOR) 10 MG tablet Take by mouth.     sevelamer carbonate (RENVELA) 800 MG tablet      sodium bicarbonate 650 MG tablet Take 650 mg by mouth twice daily     tamsulosin (FLOMAX) 0.4  MG CAPS capsule Take 0.4 mg by mouth once daily  11   TOUJEO SOLOSTAR 300 UNIT/ML SOPN Inject 15 units SQ once daily  0   VENTOLIN HFA 108 (90 Base) MCG/ACT inhaler Inhale 2 puffs by mouth 4 times daily as needed for shortness of breath or wheezing  1   Aspirin-Calcium Carbonate 81-777 MG TABS Take by mouth. (Patient not taking: Reported on 05/11/2021)     cephALEXin (KEFLEX) 500 MG capsule Take by mouth. (Patient not taking: Reported on 05/11/2021)     diltiazem (CARDIZEM CD) 360 MG 24 hr capsule Take 360 mg by mouth once daily (Patient not taking: Reported on 05/11/2021)  3   hydrochlorothiazide (HYDRODIURIL)  25 MG tablet TK 1 T PO D PRF SWELLING (Patient not taking: Reported on 05/11/2021)  3   HYDROcodone-acetaminophen (NORCO) 5-325 MG tablet Take 1-2 tablets by mouth every 6 (six) hours as needed. (Patient not taking: Reported on 05/11/2021) 30 tablet 0   losartan (COZAAR) 50 MG tablet  (Patient not taking: Reported on 05/11/2021)     multivitamin (RENA-VIT) TABS tablet Take 1 tablet by mouth daily. (Patient not taking: Reported on 05/11/2021)     omeprazole (PRILOSEC) 20 MG capsule Take 1 capsule by mouth daily. (Patient not taking: Reported on 05/11/2021)     polyethylene glycol powder (GLYCOLAX/MIRALAX) 17 GM/SCOOP powder Take by mouth. (Patient not taking: Reported on 05/11/2021)     potassium chloride (KLOR-CON) 10 MEQ tablet Take by mouth. (Patient not taking: Reported on 05/11/2021)     ranitidine (ZANTAC) 150 MG tablet Take 150 mg by mouth 2 (two) times daily as needed for heartburn. (Patient not taking: Reported on 05/11/2021)     triamcinolone cream (KENALOG) 0.5 % Apply topically. (Patient not taking: Reported on 05/11/2021)     No current facility-administered medications for this visit.      PHYSICAL EXAMINATION:   Vitals:   05/11/21 1409  BP: (!) 139/51  Pulse: 61  Resp: 14  Temp: (!) 96.8 F (36 C)  SpO2: 98%   Filed Weights   05/11/21 1409  Weight: 147 lb 11.3 oz (67 kg)     Physical Exam Vitals and nursing note reviewed.  Constitutional:      Comments: Ambulating: Walker.;  Accompanied by wife.  Appears quite frail.  HENT:     Head: Normocephalic and atraumatic.     Mouth/Throat:     Pharynx: Oropharynx is clear.  Eyes:     Extraocular Movements: Extraocular movements intact.     Pupils: Pupils are equal, round, and reactive to light.  Cardiovascular:     Rate and Rhythm: Normal rate and regular rhythm.  Pulmonary:     Comments: Decreased breath sounds bilaterally.  Abdominal:     Palpations: Abdomen is soft.  Musculoskeletal:        General: Normal range of motion.     Cervical back: Normal range of motion.  Skin:    General: Skin is warm.  Neurological:     General: No focal deficit present.     Mental Status: He is alert and oriented to person, place, and time.  Psychiatric:        Behavior: Behavior normal.        Judgment: Judgment normal.    LABORATORY DATA:  I have reviewed the data as listed Lab Results  Component Value Date   WBC 10.7 (H) 05/11/2021   HGB 6.3 (L) 05/11/2021   HCT 19.3 (L) 05/11/2021   MCV 99.0 05/11/2021   PLT 273 05/11/2021   Recent Labs    05/11/21 1507  NA 136  K 3.3*  CL 97*  CO2 30  GLUCOSE 122*  BUN 57*  CREATININE 6.03*  CALCIUM 8.1*  GFRNONAA 9*  PROT 6.5  ALBUMIN 2.4*  AST 17  ALT 21  ALKPHOS 57  BILITOT 0.3     No results found.  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  However this is on hold given his new diagnosis of RCC.  #Today hemoglobin 6.3; symptomatic.  Proceed with PRBC transfusion early next  week.  Discussed with the patient and family that given the benefits outweigh the risk of epo injections compared to progression of malignancy-I think it is reasonable to reinitiate erythropoietin stimulating agent/iron infusions as per  nephrology.  I discussed this with Dr. Holley Raring; he is in agreement.  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  Recommend continued surveillance with urology/[facility].  Thank you Dr.Lateef for allowing me to participate in the care of your pleasant patient. Please do not hesitate to contact me with questions or concerns in the interim.   # DISPOSITION:will call/Dr.Lateef # labs to day- cbc/cmp/LDH; b12/folate/ ironstudies/ferritin- # follow up as needed- Dr.B  Addendum: Discussed with Dr. Zollie Scale; as mentioned above recommend reinitiating a EPO/iron infusions.   Discussed with wife-regarding PRBC blood transfusion next week.  Patient had prior blood transfusions; again reviewed the personal risk versus benefits of transfusion.  Wife and agreement.  Also about discussed with Dr. Holley Raring.  Follow-up only as needed.  All questions were answered. The patient knows to call the clinic with any problems, questions or concerns.   Cammie Sickle, MD 05/12/2021 9:29 PM

## 2021-05-11 NOTE — Assessment & Plan Note (Addendum)
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  However this is on hold given his new diagnosis of RCC.  #Today hemoglobin 6.3; symptomatic.  Proceed with PRBC transfusion early next week.  Discussed with the patient and family that given the benefits outweigh the risk of epo injections compared to progression of malignancy-I think it is reasonable to reinitiate erythropoietin stimulating agent/iron infusions as per nephrology.  I discussed this with Dr. Holley Raring; he is in agreement.  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  Recommend continued surveillance with urology/[facility].  Thank you Dr.Lateef for allowing me to participate in the care of your pleasant patient. Please do not hesitate to contact me with questions or concerns in the interim.   # DISPOSITION:will call/Dr.Lateef # labs to day- cbc/cmp/LDH; b12/folate/ ironstudies/ferritin- # follow up as needed- Dr.B  Addendum: Discussed with Dr. Zollie Scale; as mentioned above recommend reinitiating a EPO/iron infusions.   Discussed with wife-regarding PRBC blood transfusion next week.  Patient had prior blood transfusions; again reviewed the personal risk versus benefits of transfusion.  Wife and agreement.  Also about discussed with Dr. Holley Raring.  Follow-up only as needed.

## 2021-05-14 ENCOUNTER — Inpatient Hospital Stay
Admission: EM | Admit: 2021-05-14 | Discharge: 2021-05-19 | DRG: 377 | Disposition: A | Discharge status: Home Health | Payer: Medicare Other | Attending: Internal Medicine | Admitting: Internal Medicine

## 2021-05-14 ENCOUNTER — Encounter: Payer: Self-pay | Admitting: Emergency Medicine

## 2021-05-14 ENCOUNTER — Other Ambulatory Visit: Payer: Self-pay

## 2021-05-14 DIAGNOSIS — D508 Other iron deficiency anemias: Secondary | ICD-10-CM

## 2021-05-14 DIAGNOSIS — E785 Hyperlipidemia, unspecified: Secondary | ICD-10-CM | POA: Diagnosis present

## 2021-05-14 DIAGNOSIS — N186 End stage renal disease: Secondary | ICD-10-CM

## 2021-05-14 DIAGNOSIS — E43 Unspecified severe protein-calorie malnutrition: Secondary | ICD-10-CM | POA: Insufficient documentation

## 2021-05-14 DIAGNOSIS — I10 Essential (primary) hypertension: Secondary | ICD-10-CM | POA: Diagnosis present

## 2021-05-14 DIAGNOSIS — Z992 Dependence on renal dialysis: Secondary | ICD-10-CM

## 2021-05-14 DIAGNOSIS — D649 Anemia, unspecified: Secondary | ICD-10-CM | POA: Diagnosis not present

## 2021-05-14 DIAGNOSIS — I252 Old myocardial infarction: Secondary | ICD-10-CM

## 2021-05-14 DIAGNOSIS — Z955 Presence of coronary angioplasty implant and graft: Secondary | ICD-10-CM

## 2021-05-14 DIAGNOSIS — Z952 Presence of prosthetic heart valve: Secondary | ICD-10-CM

## 2021-05-14 DIAGNOSIS — E876 Hypokalemia: Secondary | ICD-10-CM

## 2021-05-14 DIAGNOSIS — Z79899 Other long term (current) drug therapy: Secondary | ICD-10-CM

## 2021-05-14 DIAGNOSIS — D631 Anemia in chronic kidney disease: Secondary | ICD-10-CM | POA: Diagnosis present

## 2021-05-14 DIAGNOSIS — F17200 Nicotine dependence, unspecified, uncomplicated: Secondary | ICD-10-CM | POA: Diagnosis present

## 2021-05-14 DIAGNOSIS — J45909 Unspecified asthma, uncomplicated: Secondary | ICD-10-CM | POA: Diagnosis present

## 2021-05-14 DIAGNOSIS — Z7982 Long term (current) use of aspirin: Secondary | ICD-10-CM

## 2021-05-14 DIAGNOSIS — I132 Hypertensive heart and chronic kidney disease with heart failure and with stage 5 chronic kidney disease, or end stage renal disease: Secondary | ICD-10-CM | POA: Diagnosis present

## 2021-05-14 DIAGNOSIS — I5032 Chronic diastolic (congestive) heart failure: Secondary | ICD-10-CM

## 2021-05-14 DIAGNOSIS — Z8249 Family history of ischemic heart disease and other diseases of the circulatory system: Secondary | ICD-10-CM

## 2021-05-14 DIAGNOSIS — N2581 Secondary hyperparathyroidism of renal origin: Secondary | ICD-10-CM | POA: Diagnosis present

## 2021-05-14 DIAGNOSIS — K2901 Acute gastritis with bleeding: Principal | ICD-10-CM | POA: Diagnosis present

## 2021-05-14 DIAGNOSIS — Z833 Family history of diabetes mellitus: Secondary | ICD-10-CM

## 2021-05-14 DIAGNOSIS — E1122 Type 2 diabetes mellitus with diabetic chronic kidney disease: Secondary | ICD-10-CM

## 2021-05-14 DIAGNOSIS — Z794 Long term (current) use of insulin: Secondary | ICD-10-CM

## 2021-05-14 DIAGNOSIS — Z20822 Contact with and (suspected) exposure to covid-19: Secondary | ICD-10-CM | POA: Diagnosis present

## 2021-05-14 DIAGNOSIS — Z79890 Hormone replacement therapy (postmenopausal): Secondary | ICD-10-CM

## 2021-05-14 DIAGNOSIS — C641 Malignant neoplasm of right kidney, except renal pelvis: Secondary | ICD-10-CM | POA: Diagnosis present

## 2021-05-14 DIAGNOSIS — I2510 Atherosclerotic heart disease of native coronary artery without angina pectoris: Secondary | ICD-10-CM | POA: Diagnosis present

## 2021-05-14 LAB — IRON AND TIBC
Iron: 27 ug/dL — ABNORMAL LOW (ref 45–182)
Saturation Ratios: 11 % — ABNORMAL LOW (ref 17.9–39.5)
TIBC: 258 ug/dL (ref 250–450)
UIBC: 231 ug/dL

## 2021-05-14 LAB — COMPREHENSIVE METABOLIC PANEL
ALT: 17 U/L (ref 0–44)
AST: 16 U/L (ref 15–41)
Albumin: 2.4 g/dL — ABNORMAL LOW (ref 3.5–5.0)
Alkaline Phosphatase: 58 U/L (ref 38–126)
Anion gap: 10 (ref 5–15)
BUN: 68 mg/dL — ABNORMAL HIGH (ref 8–23)
CO2: 28 mmol/L (ref 22–32)
Calcium: 7.9 mg/dL — ABNORMAL LOW (ref 8.9–10.3)
Chloride: 97 mmol/L — ABNORMAL LOW (ref 98–111)
Creatinine, Ser: 5.81 mg/dL — ABNORMAL HIGH (ref 0.61–1.24)
GFR, Estimated: 9 mL/min — ABNORMAL LOW (ref >60)
Glucose, Bld: 177 mg/dL — ABNORMAL HIGH (ref 70–99)
Potassium: 3.1 mmol/L — ABNORMAL LOW (ref 3.5–5.1)
Sodium: 135 mmol/L (ref 135–145)
Total Bilirubin: 0.4 mg/dL (ref 0.3–1.2)
Total Protein: 6.1 g/dL — ABNORMAL LOW (ref 6.5–8.1)

## 2021-05-14 LAB — CBC
HCT: 16.4 % — ABNORMAL LOW (ref 39.0–52.0)
Hemoglobin: 5.5 g/dL — ABNORMAL LOW (ref 13.0–17.0)
MCH: 33.3 pg (ref 26.0–34.0)
MCHC: 33.5 g/dL (ref 30.0–36.0)
MCV: 99.4 fL (ref 80.0–100.0)
Platelets: 251 10*3/uL (ref 150–400)
RBC: 1.65 MIL/uL — ABNORMAL LOW (ref 4.22–5.81)
RDW: 13.5 % (ref 11.5–15.5)
WBC: 10.2 10*3/uL (ref 4.0–10.5)
nRBC: 0 % (ref 0.0–0.2)

## 2021-05-14 LAB — PREPARE RBC (CROSSMATCH)

## 2021-05-14 LAB — RESP PANEL BY RT-PCR (FLU A&B, COVID) ARPGX2
Influenza A by PCR: NEGATIVE
Influenza B by PCR: NEGATIVE
SARS Coronavirus 2 by RT PCR: NEGATIVE

## 2021-05-14 LAB — CBG MONITORING, ED: Glucose-Capillary: 152 mg/dL — ABNORMAL HIGH (ref 70–99)

## 2021-05-14 MED ORDER — INSULIN ASPART 100 UNIT/ML IJ SOLN
0.0000 [IU] | Freq: Three times a day (TID) | INTRAMUSCULAR | Status: DC
  Administered 2021-05-14 – 2021-05-15 (×3): 1 [IU] via SUBCUTANEOUS
  Administered 2021-05-16: 2 [IU] via SUBCUTANEOUS
  Administered 2021-05-17 (×2): 1 [IU] via SUBCUTANEOUS
  Filled 2021-05-14 (×7): qty 1

## 2021-05-14 MED ORDER — ALBUTEROL SULFATE HFA 108 (90 BASE) MCG/ACT IN AERS
2.0000 | INHALATION_SPRAY | RESPIRATORY_TRACT | Status: DC | PRN
  Filled 2021-05-14: qty 6.7

## 2021-05-14 MED ORDER — SODIUM CHLORIDE 0.9 % IV SOLN
10.0000 mL/h | Freq: Once | INTRAVENOUS | Status: AC
  Administered 2021-05-14: 10 mL/h via INTRAVENOUS

## 2021-05-14 MED ORDER — RENA-VITE PO TABS
1.0000 | ORAL_TABLET | Freq: Every day | ORAL | Status: DC
  Administered 2021-05-14 – 2021-05-19 (×6): 1 via ORAL
  Filled 2021-05-14 (×7): qty 1

## 2021-05-14 MED ORDER — TAMSULOSIN HCL 0.4 MG PO CAPS
0.4000 mg | ORAL_CAPSULE | Freq: Every day | ORAL | Status: DC
  Administered 2021-05-14 – 2021-05-19 (×6): 0.4 mg via ORAL
  Filled 2021-05-14 (×6): qty 1

## 2021-05-14 MED ORDER — PANTOPRAZOLE SODIUM 40 MG IV SOLR
40.0000 mg | Freq: Two times a day (BID) | INTRAVENOUS | Status: DC
  Administered 2021-05-14 – 2021-05-19 (×11): 40 mg via INTRAVENOUS
  Filled 2021-05-14 (×10): qty 40

## 2021-05-14 MED ORDER — ONDANSETRON HCL 4 MG PO TABS: 4.0000 mg | ORAL_TABLET | Freq: Four times a day (QID) | ORAL | Status: DC | PRN

## 2021-05-14 MED ORDER — AMLODIPINE BESYLATE 5 MG PO TABS
5.0000 mg | ORAL_TABLET | Freq: Every day | ORAL | Status: DC
  Administered 2021-05-14: 5 mg via ORAL
  Filled 2021-05-14: qty 1

## 2021-05-14 MED ORDER — POTASSIUM CHLORIDE CRYS ER 20 MEQ PO TBCR
40.0000 meq | EXTENDED_RELEASE_TABLET | Freq: Once | ORAL | Status: AC
  Administered 2021-05-14: 40 meq via ORAL
  Filled 2021-05-14: qty 2

## 2021-05-14 MED ORDER — MAGNESIUM OXIDE 400 MG PO TABS
400.0000 mg | ORAL_TABLET | Freq: Every day | ORAL | Status: DC
  Administered 2021-05-14 – 2021-05-16 (×3): 400 mg via ORAL
  Filled 2021-05-14 (×7): qty 1

## 2021-05-14 MED ORDER — ACETAMINOPHEN 650 MG RE SUPP: 650.0000 mg | Freq: Four times a day (QID) | RECTAL | Status: DC | PRN

## 2021-05-14 MED ORDER — ROSUVASTATIN CALCIUM 10 MG PO TABS
10.0000 mg | ORAL_TABLET | Freq: Every day | ORAL | Status: DC
  Administered 2021-05-14 – 2021-05-19 (×6): 10 mg via ORAL
  Filled 2021-05-14 (×7): qty 1

## 2021-05-14 MED ORDER — ACETAMINOPHEN 325 MG PO TABS: 650.0000 mg | ORAL_TABLET | Freq: Four times a day (QID) | ORAL | Status: DC | PRN

## 2021-05-14 MED ORDER — SEVELAMER CARBONATE 800 MG PO TABS
800.0000 mg | ORAL_TABLET | Freq: Two times a day (BID) | ORAL | Status: DC
  Administered 2021-05-14 – 2021-05-18 (×7): 800 mg via ORAL
  Filled 2021-05-14 (×9): qty 1

## 2021-05-14 MED ORDER — METOPROLOL TARTRATE 25 MG PO TABS
25.0000 mg | ORAL_TABLET | Freq: Two times a day (BID) | ORAL | Status: DC
  Administered 2021-05-14 – 2021-05-19 (×10): 25 mg via ORAL
  Filled 2021-05-14 (×10): qty 1

## 2021-05-14 MED ORDER — VITAMIN B-12 1000 MCG PO TABS
5000.0000 ug | ORAL_TABLET | Freq: Every day | ORAL | Status: DC
  Administered 2021-05-14 – 2021-05-19 (×6): 5000 ug via ORAL
  Filled 2021-05-14 (×7): qty 5

## 2021-05-14 MED ORDER — ONDANSETRON HCL 4 MG/2ML IJ SOLN: 4.0000 mg | Freq: Four times a day (QID) | INTRAMUSCULAR | Status: DC | PRN

## 2021-05-14 NOTE — Telephone Encounter (Signed)
Spoke with patient. Patient is extremely weak. Patient fell at home yesterday. Wife believes that pt's hgb is lower than what it was last week. She states that patient feels like he is "burning up/sweating." Wife stated that pt does not have any fevers. Wife concerned that patient needs ER visit.  I spoke with Dr. Rogue Bussing. Given the change in the patient's condition, Dr. Rogue Bussing believes that an ER visit is warranted. Wife states that she will take her husband to ER.

## 2021-05-14 NOTE — ED Notes (Signed)
Request made for transport to the floor ?

## 2021-05-14 NOTE — ED Provider Notes (Signed)
[facility] Emergency Department Provider Note  ____________________________________________   Event Date/Time   First MD Initiated Contact with Patient 05/14/21 1423     (approximate)  I have reviewed the triage vital signs and the nursing notes.   HISTORY  Chief Complaint Anemia    HPI BASCOM BIEL is a 80 y.o. male is peritoneal dialysis patient presents with anemia.  Patient states his hemoglobin was 6.2 on Friday.  His symptoms became worse over the weekend.  His physician Dr. Rogue Bussing instructed him to come to the ED for transfusion and admission.  He denies any fever chills.  No chest pain or shortness of breath.  Just weakness.  Past Medical History:  Diagnosis Date   Asthma    CHF (congestive heart failure) (HCC)    Chronic kidney disease    stage IV   Coronary artery disease    Diabetes mellitus without complication (HCC)    GERD (gastroesophageal reflux disease)    Hyperlipidemia    Hypertension    Myocardial infarction ([place])    Seizures ([place])    none since 20"s    Patient Active Problem List   Diagnosis Date Noted   Severe anemia 05/14/2021   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[place]) 05/11/2021   Symptomatic anemia 05/11/2021   Hyperlipidemia 11/03/2017   Type 2 diabetes mellitus with complication ([place]) 23/53/6144   Essential hypertension 11/03/2017    Past Surgical History:  Procedure Laterality Date   CAPD INSERTION N/A 11/07/2017   Procedure: LAPAROSCOPIC INSERTION CONTINUOUS AMBULATORY PERITONEAL DIALYSIS  (CAPD) CATHETER;  Surgeon: Katha Cabal, MD;  Location: [facility] ORS;  Service: Vascular;  Laterality: N/A;   CARDIAC SURGERY     stent placement   CATARACT EXTRACTION, BILATERAL     COLONOSCOPY     CORONARY ANGIOPLASTY     RETINAL DETACHMENT SURGERY      Prior to Admission medications   Medication Sig Start Date End Date Taking? Authorizing Provider  amLODipine (NORVASC) 5 MG tablet Take by mouth.  09/30/19   [provider]  aspirin 81 MG tablet Take 81 mg by mouth daily.  11/30/08   [provider]  Aspirin-Calcium Carbonate 81-777 MG TABS Take by mouth. Patient not taking: Reported on 05/11/2021 11/30/08   [provider]  calcitRIOL (ROCALTROL) 0.25 MCG capsule Take 0.25 mcg by mouth once daily 07/26/17   [provider]  calcium elemental as carbonate (BARIATRIC TUMS ULTRA) 400 MG chewable tablet Chew by mouth.    [provider]  cephALEXin (KEFLEX) 500 MG capsule Take by mouth. Patient not taking: Reported on 05/11/2021 03/26/21   [provider]  cyanocobalamin 1000 MCG tablet Take 2,500 mcg by mouth daily.     [provider]  Cyanocobalamin 5000 MCG CAPS Take by mouth.    [provider]  diltiazem (CARDIZEM CD) 360 MG 24 hr capsule Take 360 mg by mouth once daily Patient not taking: Reported on 05/11/2021 08/11/17   [provider]  diltiazem (TIAZAC) 360 MG 24 hr capsule Take by mouth. 08/12/16   [provider]  fluticasone (FLONASE) 50 MCG/ACT nasal spray Place into the nose. 11/09/20 11/09/21  [provider]  furosemide (LASIX) 40 MG tablet  04/27/21   [provider]  gentamicin cream (GARAMYCIN) 0.1 % Apply topically. 12/04/17   [provider]  glucagon 1 MG injection See admin instructions. 01/06/18   [provider]  HUMULIN N KWIKPEN 100 UNIT/ML Kiwkpen SMARTSIG:16 Unit(s)  SUB-Q Every Night 11/13/20   [provider]  hydrALAZINE (APRESOLINE) 25 MG tablet Take 25 mg by mouth 2 (two) times daily. 02/06/21   [provider]  hydrochlorothiazide (HYDRODIURIL) 25 MG tablet TK 1 T PO D PRF SWELLING Patient not taking: Reported on 05/11/2021 11/12/17   [provider]  HYDROcodone-acetaminophen (NORCO) 5-325 MG tablet Take 1-2 tablets by mouth every 6 (six) hours as needed. Patient not taking: Reported on 05/11/2021 11/07/17   Schnier,  Dolores Lory, MD  insulin lispro (HUMALOG) 100 UNIT/ML KiwkPen Inject 2-7 units SQ twice daily per sliding scale 10/17/16   [provider]  levothyroxine (SYNTHROID) 100 MCG tablet Take 100 mcg by mouth daily. 04/25/21   [provider]  losartan (COZAAR) 100 MG tablet Take 100 mg by mouth at bedtime.  09/22/17   [provider]  losartan (COZAAR) 50 MG tablet  04/27/21   [provider]  magnesium oxide (MAG-OX) 400 MG tablet Take 1 tablet by mouth daily.    [provider]  metoprolol succinate (TOPROL-XL) 25 MG 24 hr tablet Take 12.5 mg by mouth at bedtime.  09/01/17   [provider]  metoprolol tartrate (LOPRESSOR) 25 MG tablet Take 25 mg by mouth 2 (two) times daily. 04/25/21   [provider]  multivitamin (RENA-VIT) TABS tablet Take 1 tablet by mouth daily. Patient not taking: Reported on 05/11/2021 04/27/21   [provider]  nitroGLYCERIN (NITROSTAT) 0.4 MG SL tablet Place under the tongue. 09/01/17   [provider]  omeprazole (PRILOSEC) 20 MG capsule Take 1 capsule by mouth daily. Patient not taking: Reported on 05/11/2021 02/04/21   [provider]  polyethylene glycol powder (GLYCOLAX/MIRALAX) 17 GM/SCOOP powder Take by mouth. Patient not taking: Reported on 05/11/2021 11/09/20   [provider]  potassium chloride (KLOR-CON) 10 MEQ tablet Take by mouth. Patient not taking: Reported on 05/11/2021    [provider]  ranitidine (ZANTAC) 150 MG tablet Take 150 mg by mouth 2 (two) times daily as needed for heartburn. Patient not taking: Reported on 05/11/2021    [provider]  rosuvastatin (CRESTOR) 10 MG tablet Take by mouth. 11/28/17 05/11/21  [provider]  sevelamer carbonate (RENVELA) 800 MG tablet  04/27/21   [provider]  sodium bicarbonate 650 MG tablet Take 650 mg by mouth twice daily 08/09/17   [provider]  tamsulosin (FLOMAX) 0.4 MG CAPS  capsule Take 0.4 mg by mouth once daily 08/22/17   [provider]  TOUJEO SOLOSTAR 300 UNIT/ML SOPN Inject 15 units SQ once daily 10/07/17   [provider]  triamcinolone cream (KENALOG) 0.5 % Apply topically. Patient not taking: Reported on 05/11/2021 03/23/18   [provider]  VENTOLIN HFA 108 (90 Base) MCG/ACT inhaler Inhale 2 puffs by mouth 4 times daily as needed for shortness of breath or wheezing 10/06/17   [provider]    Allergies Hydralazine, Atorvastatin, Carvedilol, Ciprofloxacin, Other, Penicillins, Sulfa antibiotics, and Sulfasalazine  Family History  Problem Relation Age of Onset   Heart disease Mother    Diabetes Mother    Diabetes Father     Social History Social History   Tobacco Use   Smoking status: Light Smoker    Pack years: 0.00   Smokeless tobacco: Never  Vaping Use   Vaping Use: Never used  Substance Use Topics   Alcohol use: No   Drug use: No    Review of Systems  Constitutional: No fever/chills  Eyes: No visual changes. ENT: No sore throat. Respiratory: Denies cough Cardiovascular: Denies chest pain Gastrointestinal: Denies abdominal pain Genitourinary: Negative for dysuria. Musculoskeletal: Negative for back pain. Skin: Negative for rash. Psychiatric: no mood changes,     ____________________________________________   PHYSICAL EXAM:  VITAL SIGNS: ED Triage Vitals  Enc Vitals Group     BP 05/14/21 1246 (!) 140/52     Pulse Rate 05/14/21 1246 66     Resp 05/14/21 1246 14     Temp 05/14/21 1246 97.9 F (36.6 C)     Temp Source 05/14/21 1246 Oral     SpO2 05/14/21 1246 99 %     Weight 05/14/21 1244 147 lb 11.3 oz (67 kg)     Height 05/14/21 1244 5\' 8"  (1.727 m)     Head Circumference --      Peak Flow --      Pain Score 05/14/21 1244 0     Pain Loc --      Pain Edu? --      Excl. in [place]? --     Constitutional: Alert and oriented.  Pale and tired appearing and in no acute distress. Eyes:  Conjunctivae are normal.  Head: Atraumatic. Nose: No congestion/rhinnorhea. Mouth/Throat: Mucous membranes are moist.   Neck:  supple no lymphadenopathy noted Cardiovascular: Normal rate, regular rhythm.  Respiratory: Normal respiratory effort.  No retractions, lungs c t a  Abd: soft nontender bs normal all 4 quad GU: deferred Musculoskeletal: FROM all extremities, warm and well perfused Neurologic:  Normal speech and language.  Skin:  Skin is warm, dry and intact. No rash noted. Psychiatric: Mood and affect are normal. Speech and behavior are normal.  ____________________________________________   LABS (all labs ordered are listed, but only abnormal results are displayed)  Labs Reviewed  COMPREHENSIVE METABOLIC PANEL - Abnormal; Notable for the following components:      Result Value   Potassium 3.1 (*)    Chloride 97 (*)    Glucose, Bld 177 (*)    BUN 68 (*)    Creatinine, Ser 5.81 (*)    Calcium 7.9 (*)    Total Protein 6.1 (*)    Albumin 2.4 (*)    GFR, Estimated 9 (*)    All other components within normal limits  CBC - Abnormal; Notable for the following components:   RBC 1.65 (*)    Hemoglobin 5.5 (*)    HCT 16.4 (*)    All other components within normal limits  RESP PANEL BY RT-PCR (FLU A&B, COVID) ARPGX2  POC OCCULT BLOOD, ED  TYPE AND SCREEN  PREPARE RBC (CROSSMATCH)   ____________________________________________   ____________________________________________  RADIOLOGY    ____________________________________________   PROCEDURES  Procedure(s) performed: No  Procedures    ____________________________________________   INITIAL IMPRESSION / ASSESSMENT AND PLAN / ED COURSE  Pertinent labs & imaging results that were available during my care of the patient were reviewed by me and considered in my medical decision making (see chart for details).   The patient 80 year old male presents emergency department with anemia.  See HPI.  Physical exam  shows patient per stable at this time.  DDx: Anemia, GI bleed, weakness, electrolyte imbalance  CBC has decreased hemoglobin of 5.5, comprehensive metabolic panel has decreased potassium 3.1, glucose elevated at 177, creatinine and BUN are elevated due to chronic kidney disease  Discussed the findings with the patient and family.  He is here for transfusion.  Will be admitted.  Consult hospitalist initiated.  Type and screen was a positive antibody screen was negative.  Blood transfusion ordered by Dr. Joni Fears  Rectal exam showed a guaiac positive Hemoccult  Talked with hospitalist.  Will admit  ALDWIN MICALIZZI was evaluated in Emergency Department on 05/14/2021 for the symptoms described in the history of present illness. He was evaluated in the context of the global COVID-19 pandemic, which necessitated consideration that the patient might be at risk for infection with the SARS-CoV-2 virus that causes COVID-19. Institutional protocols and algorithms that pertain to the evaluation of patients at risk for COVID-19 are in a state of rapid change based on information released by regulatory bodies including the CDC and federal and state organizations. These policies and algorithms were followed during the patient's care in the ED.    As part of my medical decision making, I reviewed the following data within the [facility] History obtained from family, Nursing notes reviewed and incorporated, Labs reviewed , Old chart reviewed, Discussed with admitting physician , Notes from prior ED visits, and [place] Controlled Substance Database  ____________________________________________   FINAL CLINICAL IMPRESSION(S) / ED DIAGNOSES  Final diagnoses:  Other iron deficiency anemia      NEW MEDICATIONS STARTED DURING THIS VISIT:  New Prescriptions   No medications on file     Note:  This document was prepared using Dragon voice recognition software and may include unintentional  dictation errors.    Versie Starks, PA-C 05/14/21 1517    Carrie Mew, MD 05/15/21 [phone]

## 2021-05-14 NOTE — Progress Notes (Signed)
[facility]  ROUNDING NOTE   Subjective:   Manuel Klein is a 80 y.o. 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 He presents to the ED with increased weakness and fatigue.   Patient is followed by our clinic for peritoneal dialysis. We have been consulted to manage this treatment during admission. Patient resting on stretcher with wife at bedside. Currently receiving 1 unit RBCs. He was found to have a Hgb of 6.3 at a doctor's appt on 05/11/21. On arrival to ED, Hgb 5.5. Denies bloody stool or emesis at home, but has had dark stools. Decreased appetite. Spouse says he has continued with PD at night but fluid removal has decreased. He did complete his PD treatment last night. Denies NSAID use. Endorses shortness of breath on exertion. Denies nausea, vomiting, diarrhea and fever.   Objective:  Vital signs in last 24 hours:  Temp:  [97.8 F (36.6 C)-97.9 F (36.6 C)] 97.8 F (36.6 C) (06/27 1600) Pulse Rate:  [66-67] 67 (06/27 1600) Resp:  [14-16] 16 (06/27 1600) BP: (140-150)/(52-55) 145/53 (06/27 1600) SpO2:  [98 %-99 %] 98 % (06/27 1600) Weight:  [67 kg] 67 kg (06/27 1244)  Weight change:  Filed Weights   05/14/21 1244  Weight: 67 kg    Intake/Output: No intake/output data recorded.   Intake/Output this shift:  No intake/output data recorded.  Physical Exam: General: NAD  Head: Normocephalic, atraumatic. Moist oral mucosal membranes  Eyes: Anicteric  Lungs:  Clear to auscultation, normal breathing effort  Heart: Regular rate and rhythm  Abdomen:  Soft, nontender  Extremities:  2+ peripheral edema right leg.  Neurologic: Nonfocal, moving all four extremities  Skin: No lesions  Access: PD catheter    Basic Metabolic Panel: Recent Labs  Lab 05/11/21 1507 05/14/21 1253  NA 136 135  K 3.3* 3.1*  CL 97* 97*  CO2 30 28  GLUCOSE  122* 177*  BUN 57* 68*  CREATININE 6.03* 5.81*  CALCIUM 8.1* 7.9*    Liver Function Tests: Recent Labs  Lab 05/11/21 1507 05/14/21 1253  AST 17 16  ALT 21 17  ALKPHOS 57 58  BILITOT 0.3 0.4  PROT 6.5 6.1*  ALBUMIN 2.4* 2.4*   No results for input(s): LIPASE, AMYLASE in the last 168 hours. No results for input(s): AMMONIA in the last 168 hours.  CBC: Recent Labs  Lab 05/11/21 1507 05/14/21 1253  WBC 10.7* 10.2  NEUTROABS 6.9  --   HGB 6.3* 5.5*  HCT 19.3* 16.4*  MCV 99.0 99.4  PLT 273 251    Cardiac Enzymes: No results for input(s): CKTOTAL, CKMB, CKMBINDEX, TROPONINI in the last 168 hours.  BNP: Invalid input(s): POCBNP  CBG: No results for input(s): GLUCAP in the last 168 hours.  Microbiology: No results found for this or any previous visit.  Coagulation Studies: No results for input(s): LABPROT, INR in the last 72 hours.  Urinalysis: No results for input(s): COLORURINE, LABSPEC, PHURINE, GLUCOSEU, HGBUR, BILIRUBINUR, KETONESUR, PROTEINUR, UROBILINOGEN, NITRITE, LEUKOCYTESUR in the last 72 hours.  Invalid input(s): APPERANCEUR    Imaging: No results found.   Medications:    sodium chloride      amLODipine  5 mg Oral Daily   insulin aspart  0-6 Units Subcutaneous TID WC   magnesium oxide  400 mg Oral Daily   metoprolol tartrate  25 mg Oral BID   multivitamin  1 tablet  Oral Daily   pantoprazole (PROTONIX) IV  40 mg Intravenous Q12H   potassium chloride  40 mEq Oral Once   rosuvastatin  10 mg Oral Daily   sevelamer carbonate  800 mg Oral BID WC   tamsulosin  0.4 mg Oral Daily   vitamin B-12  5,000 mcg Oral Daily   acetaminophen **OR** acetaminophen, albuterol, ondansetron **OR** ondansetron (ZOFRAN) IV  Assessment/ Plan:  [name] is a 80 y.o.  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 He presents to the ED  with increased weakness and fatigue.   [facility] PD-[facility] 4 cycles, 2L fills, no last fill  End Stage Renal Disease on peritoneal dialysis: Last peritoneal dialysis 05/13/21. Tolerated well. PD catheter intact Will maintain outpatient schedule while admitted Will hold treatment tonight and resume tomorrow evening.   2. Anemia of chronic kidney disease microcytic.  Lab Results  Component Value Date   HGB 5.5 (L) 05/14/2021   Recent history of Cancer, no EPO Receiving 1 unit RBCs now Hemoccult stool positive  3. Secondary Hyperparathyroidism: with outpatient labs: calcium 7.9 on 11/15/20.   Lab Results  Component Value Date   CALCIUM 7.9 (L) 05/14/2021  Renvela with meals  4. Hypertension Home regimen includes amlodipine, hydralazine,losartan, metoprolol and furosemide. Currently receiving amlodipine and metoprolol.         LOS: 0 Levert Heslop 6/27/20224:18 PM

## 2021-05-14 NOTE — H&P (Signed)
Code 10.2, hemoglobin 5.5, hematocrit 16.4 History and Physical    Manuel Klein NWG:[id number] DOB: 25-Sep-1941 DOA: 05/14/2021  PCP: Lynnell Jude, MD   Patient coming from: Home  I have personally briefly reviewed patient's old medical records in [place]  Chief Complaint: Weakness  HPI: Manuel Klein is a 80 y.o. male with medical history significant for diabetes mellitus with complications of end-stage renal disease on peritoneal dialysis, history of coronary artery disease status post CABG, status post valve replacement, hypertension, renal cell carcinoma status post cryoablation and CHF who presents to the ER with his wife for evaluation of generalized weakness. Patient has a history of anemia and was seen by hematology a couple of days prior to his admission.  During that visit he was found to have a hemoglobin of 6.3g/dl.  He was scheduled for outpatient blood transfusion but prior to his appointment his wife noticed that he had become increasingly weaker and now uses a rolling walker for ambulation instead of his cane.  Patient states that he has felt dizzy and lightheaded and actually fell 1 day prior to his admission and bruised his right arm.  He complains of shortness of breath with mild to moderate exertion but denies having any chest pain.  He states that his stools have been dark in color but denies having abdominal pain he denies any NSAID use.  He received IV iron infusion. Patient was last transfused in December when he had CABG as well as TAVR.  At that time he had work-up done to rule out GI source of blood loss and according to his wife all testing came back negative. He denies having any fever, chills, no headache, no blurred vision, no cough, no urinary symptoms, no diarrhea, no constipation, no weight loss, no focal deficits or blurred vision. Labs show sodium 135, potassium 3.1, chloride 97, bicarb 28, glucose 177, BUN 68, creatinine 5.8, calcium 7.9, alkaline  phosphatase 58, albumin 2.4, AST 16, ALT 17, total protein 6.1,MCV 99.4, RDW 13.5, platelet count 251      ED Course: Patient is a 80 year old male with a history of diabetes mellitus complicated by end-stage renal disease on peritoneal dialysis, history of coronary artery disease status post CABG status post TAVR, hypertension who presents to the ER from the cancer center for evaluation of worsening weakness, recent fall and a drop in his hemoglobin from 6.3g/dl to 5.5g/dl.  Patient states that his stools have been dark in color and per ER nurse practitioner stools were heme positive. Patient will be referred to observation status for further evaluation.    Review of Systems: As per HPI otherwise all other systems reviewed and negative.    Past Medical History:  Diagnosis Date   Asthma    CHF (congestive heart failure) (HCC)    Chronic kidney disease    stage IV   Coronary artery disease    Diabetes mellitus without complication (HCC)    GERD (gastroesophageal reflux disease)    Hyperlipidemia    Hypertension    Myocardial infarction ([place])    Seizures ([place])    none since 20"s    Past Surgical History:  Procedure Laterality Date   CAPD INSERTION N/A 11/07/2017   Procedure: LAPAROSCOPIC INSERTION CONTINUOUS AMBULATORY PERITONEAL DIALYSIS  (CAPD) CATHETER;  Surgeon: Katha Cabal, MD;  Location: [facility] ORS;  Service: Vascular;  Laterality: N/A;   CARDIAC SURGERY     stent placement   CATARACT EXTRACTION, BILATERAL  COLONOSCOPY     CORONARY ANGIOPLASTY     RETINAL DETACHMENT SURGERY       reports that he has been smoking. He has never used smokeless tobacco. He reports that he does not drink alcohol and does not use drugs.  Allergies  Allergen Reactions   Hydralazine     Other reaction(s): Dizziness, Muscle Pain Chest pain   Atorvastatin     Other reaction(s): Muscle Pain   Carvedilol Other (See Comments)    Dizziness, drunk feeling   Ciprofloxacin Rash   Other  Rash   Penicillins Rash and Other (See Comments)    Has patient had a PCN reaction causing immediate rash, facial/tongue/throat swelling, SOB or lightheadedness with hypotension: No Has patient had a PCN reaction causing severe rash involving mucus membranes or skin necrosis: No Has patient had a PCN reaction that required hospitalization: No Has patient had a PCN reaction occurring within the last 10 years: No If all of the above answers are "NO", then may proceed with Cephalosporin use.    Sulfa Antibiotics Rash   Sulfasalazine Rash    Family History  Problem Relation Age of Onset   Heart disease Mother    Diabetes Mother    Diabetes Father       Prior to Admission medications   Medication Sig Start Date End Date Taking? Authorizing Provider  amLODipine (NORVASC) 5 MG tablet Take by mouth. 09/30/19   [provider]  aspirin 81 MG tablet Take 81 mg by mouth daily.  11/30/08   [provider]  Aspirin-Calcium Carbonate 81-777 MG TABS Take by mouth. Patient not taking: Reported on 05/11/2021 11/30/08   [provider]  calcitRIOL (ROCALTROL) 0.25 MCG capsule Take 0.25 mcg by mouth once daily 07/26/17   [provider]  calcium elemental as carbonate (BARIATRIC TUMS ULTRA) 400 MG chewable tablet Chew by mouth.    [provider]  cephALEXin (KEFLEX) 500 MG capsule Take by mouth. Patient not taking: Reported on 05/11/2021 03/26/21   [provider]  cyanocobalamin 1000 MCG tablet Take 2,500 mcg by mouth daily.     [provider]  Cyanocobalamin 5000 MCG CAPS Take by mouth.    [provider]  diltiazem (CARDIZEM CD) 360 MG 24 hr capsule Take 360 mg by mouth once daily Patient not taking: Reported on 05/11/2021 08/11/17   [provider]  diltiazem (TIAZAC) 360 MG 24 hr capsule Take by mouth. 08/12/16   [provider]  fluticasone (FLONASE) 50 MCG/ACT nasal spray Place into the nose. 11/09/20 11/09/21   [provider]  furosemide (LASIX) 40 MG tablet  04/27/21   [provider]  gentamicin cream (GARAMYCIN) 0.1 % Apply topically. 12/04/17   [provider]  glucagon 1 MG injection See admin instructions. 01/06/18   [provider]  Camille Bal KWIKPEN 100 UNIT/ML Kiwkpen SMARTSIG:16 Unit(s) SUB-Q Every Night 11/13/20   [provider]  hydrALAZINE (APRESOLINE) 25 MG tablet Take 25 mg by mouth 2 (two) times daily. 02/06/21   [provider]  hydrochlorothiazide (HYDRODIURIL) 25 MG tablet TK 1 T PO D PRF SWELLING Patient not taking: Reported on 05/11/2021 11/12/17   [provider]  HYDROcodone-acetaminophen (NORCO) 5-325 MG tablet Take 1-2 tablets by mouth every 6 (six) hours as needed. Patient not taking: Reported on 05/11/2021 11/07/17   Schnier, Dolores Lory, MD  insulin lispro (HUMALOG) 100 UNIT/ML KiwkPen Inject 2-7 units SQ twice daily per sliding scale 10/17/16   [provider]  levothyroxine (SYNTHROID) 100 MCG tablet Take 100 mcg by mouth daily. 04/25/21   [provider]  losartan (COZAAR) 100 MG tablet Take 100 mg by mouth at bedtime.  09/22/17   [provider]  losartan (COZAAR) 50 MG tablet  04/27/21   [provider]  magnesium oxide (MAG-OX) 400 MG tablet Take 1 tablet by mouth daily.    [provider]  metoprolol succinate (TOPROL-XL) 25 MG 24 hr tablet Take 12.5 mg by mouth at bedtime.  09/01/17   [provider]  metoprolol tartrate (LOPRESSOR) 25 MG tablet Take 25 mg by mouth 2 (two) times daily. 04/25/21   [provider]  multivitamin (RENA-VIT) TABS tablet Take 1 tablet by mouth daily. Patient not taking: Reported on 05/11/2021 04/27/21   [provider]  nitroGLYCERIN (NITROSTAT) 0.4 MG SL tablet Place under the tongue. 09/01/17   [provider]  omeprazole (PRILOSEC) 20 MG capsule Take 1 capsule by mouth daily. Patient not taking: Reported on  05/11/2021 02/04/21   [provider]  polyethylene glycol powder (GLYCOLAX/MIRALAX) 17 GM/SCOOP powder Take by mouth. Patient not taking: Reported on 05/11/2021 11/09/20   [provider]  potassium chloride (KLOR-CON) 10 MEQ tablet Take by mouth. Patient not taking: Reported on 05/11/2021    [provider]  ranitidine (ZANTAC) 150 MG tablet Take 150 mg by mouth 2 (two) times daily as needed for heartburn. Patient not taking: Reported on 05/11/2021    [provider]  rosuvastatin (CRESTOR) 10 MG tablet Take by mouth. 11/28/17 05/11/21  [provider]  sevelamer carbonate (RENVELA) 800 MG tablet  04/27/21   [provider]  sodium bicarbonate 650 MG tablet Take 650 mg by mouth twice daily 08/09/17   [provider]  tamsulosin (FLOMAX) 0.4 MG CAPS capsule Take 0.4 mg by mouth once daily 08/22/17   [provider]  TOUJEO SOLOSTAR 300 UNIT/ML SOPN Inject 15 units SQ once daily 10/07/17   [provider]  triamcinolone cream (KENALOG) 0.5 % Apply topically. Patient not taking: Reported on 05/11/2021 03/23/18   [provider]  VENTOLIN HFA 108 (90 Base) MCG/ACT inhaler Inhale 2 puffs by mouth 4 times daily as needed for shortness of breath or wheezing 10/06/17   [provider]    Physical Exam: Vitals:   05/14/21 1244 05/14/21 1246 05/14/21 1430  BP:  (!) 140/52 (!) 150/55  Pulse:  66 66  Resp:  14 15  Temp:  97.9 F (36.6 C)   TempSrc:  Oral   SpO2:  99% 98%  Weight: 67 kg    Height: 5\' 8"  (1.727 m)       Vitals:   05/14/21 1244 05/14/21 1246 05/14/21 1430  BP:  (!) 140/52 (!) 150/55  Pulse:  66 66  Resp:  14 15  Temp:  97.9 F (36.6 C)   TempSrc:  Oral   SpO2:  99% 98%  Weight: 67 kg    Height: 5\' 8"  (1.727 m)        Constitutional: Alert and oriented x 3 . Not in any apparent distress HEENT:      Head: Normocephalic and atraumatic.         Eyes: PERLA, EOMI, Conjunctivae  pallor. Sclera is non-icteric.       Mouth/Throat: Mucous membranes are moist.       Neck: Supple with no signs of meningismus. Cardiovascular: Regular rate and rhythm.  Systolic ejection murmur, gallops, or rubs. 2+ symmetrical  distal pulses are present . No JVD.  Trace bilateral LE edema Respiratory: Respiratory effort normal .Lungs sounds clear bilaterally. No wheezes, crackles, or rhonchi.  Gastrointestinal: Soft, non tender, and non distended with positive bowel sounds.  PD catheter in place Genitourinary: No CVA tenderness. Musculoskeletal: Nontender with normal range of motion in all extremities. No cyanosis, or erythema of extremities. Neurologic:  Face is symmetric. Moving all extremities. No gross focal neurologic deficits .  Generalized weakness Skin: Skin is warm, dry.  No rash or ulcers Psychiatric: Mood and affect are normal    Labs on Admission: I have personally reviewed following labs and imaging studies  CBC: Recent Labs  Lab 05/11/21 1507 05/14/21 1253  WBC 10.7* 10.2  NEUTROABS 6.9  --   HGB 6.3* 5.5*  HCT 19.3* 16.4*  MCV 99.0 99.4  PLT 273 423   Basic Metabolic Panel: Recent Labs  Lab 05/11/21 1507 05/14/21 1253  NA 136 135  K 3.3* 3.1*  CL 97* 97*  CO2 30 28  GLUCOSE 122* 177*  BUN 57* 68*  CREATININE 6.03* 5.81*  CALCIUM 8.1* 7.9*   GFR: Estimated Creatinine Clearance: 9.8 mL/min (A) (by C-G formula based on SCr of 5.81 mg/dL (H)). Liver Function Tests: Recent Labs  Lab 05/11/21 1507 05/14/21 1253  AST 17 16  ALT 21 17  ALKPHOS 57 58  BILITOT 0.3 0.4  PROT 6.5 6.1*  ALBUMIN 2.4* 2.4*   No results for input(s): LIPASE, AMYLASE in the last 168 hours. No results for input(s): AMMONIA in the last 168 hours. Coagulation Profile: No results for input(s): INR, PROTIME in the last 168 hours. Cardiac Enzymes: No results for input(s): CKTOTAL, CKMB, CKMBINDEX, TROPONINI in the last 168 hours. BNP (last 3 results) No results for input(s):  PROBNP in the last 8760 hours. HbA1C: No results for input(s): HGBA1C in the last 72 hours. CBG: No results for input(s): GLUCAP in the last 168 hours. Lipid Profile: No results for input(s): CHOL, HDL, LDLCALC, TRIG, CHOLHDL, LDLDIRECT in the last 72 hours. Thyroid Function Tests: No results for input(s): TSH, T4TOTAL, FREET4, T3FREE, THYROIDAB in the last 72 hours. Anemia Panel: No results for input(s): VITAMINB12, FOLATE, FERRITIN, TIBC, IRON, RETICCTPCT in the last 72 hours. Urine analysis: No results found for: COLORURINE, APPEARANCEUR, LABSPEC, PHURINE, GLUCOSEU, HGBUR, BILIRUBINUR, KETONESUR, PROTEINUR, UROBILINOGEN, NITRITE, LEUKOCYTESUR  Radiological Exams on Admission: No results found.   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 He was on EPO until 12/21 when he was diagnosed with renal cell carcinoma Patient was found to have a hemoglobin of 6.3g/dl a couple of days prior to his hospitalization and today on admission his hemoglobin is 5.5g/dl. He states that he has had stools which are heme positive We will transfuse 2 units of packed RBC Obtain serum iron levels Consult gastroenterology Place patient on Protonix 40 mg IV every 12     Type 2 diabetes mellitus with complications of end-stage renal disease on peritoneal dialysis Will request nephrology consult for initiation of peritoneal dialysis Place patient on a clear liquid diet Glycemic control with sliding scale insulin     Essential hypertension Continue amlodipine and  metoprolol Will hold Cozaar and hydralazine for now    Coronary artery  disease status post CABG Hold aspirin Continue statins and metoprolol    Hypokalemia Related to diuretic use Supplement potassium    Chronic diastolic dysfunction CHF Hold furosemide and Cozaar for now Will continue metoprolol   DVT prophylaxis: SCD  Code Status: full code.  Family Communication:  Greater than 50% of time was spent discussing patient's condition and plan of care with him and his wife at the bedside.  All questions and concerns have been addressed.  They verbalized understanding and agree with the plan. Disposition Plan: Back to previous home environment Consults called: Nephrology/gastroenterology Status: Observation    Jalyssa Fleisher MD [facility]     05/14/2021, 4:01 PM

## 2021-05-14 NOTE — ED Triage Notes (Signed)
Arrives with wife who states patient continues to be weak.  Has taken Epogen in the past, but currently not on medication. Sent to ED by Dr. Rogue Bussing.  Patient awake and alert. NAD

## 2021-05-15 DIAGNOSIS — I132 Hypertensive heart and chronic kidney disease with heart failure and with stage 5 chronic kidney disease, or end stage renal disease: Secondary | ICD-10-CM | POA: Diagnosis present

## 2021-05-15 DIAGNOSIS — F17200 Nicotine dependence, unspecified, uncomplicated: Secondary | ICD-10-CM | POA: Diagnosis present

## 2021-05-15 DIAGNOSIS — K310 Acute dilatation of stomach: Secondary | ICD-10-CM | POA: Diagnosis not present

## 2021-05-15 DIAGNOSIS — Z992 Dependence on renal dialysis: Secondary | ICD-10-CM | POA: Diagnosis not present

## 2021-05-15 DIAGNOSIS — I10 Essential (primary) hypertension: Secondary | ICD-10-CM

## 2021-05-15 DIAGNOSIS — Z955 Presence of coronary angioplasty implant and graft: Secondary | ICD-10-CM | POA: Diagnosis not present

## 2021-05-15 DIAGNOSIS — Z20822 Contact with and (suspected) exposure to covid-19: Secondary | ICD-10-CM | POA: Diagnosis present

## 2021-05-15 DIAGNOSIS — J45909 Unspecified asthma, uncomplicated: Secondary | ICD-10-CM | POA: Diagnosis present

## 2021-05-15 DIAGNOSIS — Z794 Long term (current) use of insulin: Secondary | ICD-10-CM | POA: Diagnosis not present

## 2021-05-15 DIAGNOSIS — E1122 Type 2 diabetes mellitus with diabetic chronic kidney disease: Secondary | ICD-10-CM

## 2021-05-15 DIAGNOSIS — K2901 Acute gastritis with bleeding: Secondary | ICD-10-CM | POA: Diagnosis present

## 2021-05-15 DIAGNOSIS — D649 Anemia, unspecified: Secondary | ICD-10-CM

## 2021-05-15 DIAGNOSIS — E43 Unspecified severe protein-calorie malnutrition: Secondary | ICD-10-CM

## 2021-05-15 DIAGNOSIS — K2900 Acute gastritis without bleeding: Secondary | ICD-10-CM | POA: Diagnosis not present

## 2021-05-15 DIAGNOSIS — I5032 Chronic diastolic (congestive) heart failure: Secondary | ICD-10-CM | POA: Diagnosis present

## 2021-05-15 DIAGNOSIS — Z952 Presence of prosthetic heart valve: Secondary | ICD-10-CM | POA: Diagnosis not present

## 2021-05-15 DIAGNOSIS — I252 Old myocardial infarction: Secondary | ICD-10-CM | POA: Diagnosis not present

## 2021-05-15 DIAGNOSIS — I2510 Atherosclerotic heart disease of native coronary artery without angina pectoris: Secondary | ICD-10-CM | POA: Diagnosis present

## 2021-05-15 DIAGNOSIS — K922 Gastrointestinal hemorrhage, unspecified: Secondary | ICD-10-CM | POA: Diagnosis not present

## 2021-05-15 DIAGNOSIS — Z7982 Long term (current) use of aspirin: Secondary | ICD-10-CM | POA: Diagnosis not present

## 2021-05-15 DIAGNOSIS — D508 Other iron deficiency anemias: Secondary | ICD-10-CM

## 2021-05-15 DIAGNOSIS — N186 End stage renal disease: Secondary | ICD-10-CM

## 2021-05-15 DIAGNOSIS — N2581 Secondary hyperparathyroidism of renal origin: Secondary | ICD-10-CM | POA: Diagnosis present

## 2021-05-15 DIAGNOSIS — E876 Hypokalemia: Secondary | ICD-10-CM

## 2021-05-15 DIAGNOSIS — Z79899 Other long term (current) drug therapy: Secondary | ICD-10-CM | POA: Diagnosis not present

## 2021-05-15 DIAGNOSIS — Z8249 Family history of ischemic heart disease and other diseases of the circulatory system: Secondary | ICD-10-CM | POA: Diagnosis not present

## 2021-05-15 DIAGNOSIS — D509 Iron deficiency anemia, unspecified: Secondary | ICD-10-CM | POA: Diagnosis not present

## 2021-05-15 DIAGNOSIS — E785 Hyperlipidemia, unspecified: Secondary | ICD-10-CM | POA: Diagnosis present

## 2021-05-15 DIAGNOSIS — D631 Anemia in chronic kidney disease: Secondary | ICD-10-CM | POA: Diagnosis present

## 2021-05-15 DIAGNOSIS — C641 Malignant neoplasm of right kidney, except renal pelvis: Secondary | ICD-10-CM | POA: Diagnosis present

## 2021-05-15 DIAGNOSIS — Z79890 Hormone replacement therapy (postmenopausal): Secondary | ICD-10-CM | POA: Diagnosis not present

## 2021-05-15 LAB — BASIC METABOLIC PANEL
Anion gap: 7 (ref 5–15)
BUN: 69 mg/dL — ABNORMAL HIGH (ref 8–23)
CO2: 28 mmol/L (ref 22–32)
Calcium: 7.7 mg/dL — ABNORMAL LOW (ref 8.9–10.3)
Chloride: 101 mmol/L (ref 98–111)
Creatinine, Ser: 6.12 mg/dL — ABNORMAL HIGH (ref 0.61–1.24)
GFR, Estimated: 9 mL/min — ABNORMAL LOW (ref >60)
Glucose, Bld: 122 mg/dL — ABNORMAL HIGH (ref 70–99)
Potassium: 3.7 mmol/L (ref 3.5–5.1)
Sodium: 136 mmol/L (ref 135–145)

## 2021-05-15 LAB — GLUCOSE, CAPILLARY
Glucose-Capillary: 120 mg/dL — ABNORMAL HIGH (ref 70–99)
Glucose-Capillary: 153 mg/dL — ABNORMAL HIGH (ref 70–99)
Glucose-Capillary: 168 mg/dL — ABNORMAL HIGH (ref 70–99)
Glucose-Capillary: 187 mg/dL — ABNORMAL HIGH (ref 70–99)
Glucose-Capillary: 216 mg/dL — ABNORMAL HIGH (ref 70–99)

## 2021-05-15 LAB — CBC
HCT: 20.1 % — ABNORMAL LOW (ref 39.0–52.0)
Hemoglobin: 6.8 g/dL — ABNORMAL LOW (ref 13.0–17.0)
MCH: 32.1 pg (ref 26.0–34.0)
MCHC: 33.8 g/dL (ref 30.0–36.0)
MCV: 94.8 fL (ref 80.0–100.0)
Platelets: 203 10*3/uL (ref 150–400)
RBC: 2.12 MIL/uL — ABNORMAL LOW (ref 4.22–5.81)
RDW: 14.4 % (ref 11.5–15.5)
WBC: 8.8 10*3/uL (ref 4.0–10.5)
nRBC: 0 % (ref 0.0–0.2)

## 2021-05-15 LAB — HEMOGLOBIN A1C
Hgb A1c MFr Bld: 6.4 % — ABNORMAL HIGH (ref 4.8–5.6)
Mean Plasma Glucose: 137 mg/dL

## 2021-05-15 LAB — HEMOGLOBIN AND HEMATOCRIT, BLOOD
HCT: 31 % — ABNORMAL LOW (ref 39.0–52.0)
Hemoglobin: 10.6 g/dL — ABNORMAL LOW (ref 13.0–17.0)

## 2021-05-15 LAB — PREPARE RBC (CROSSMATCH)

## 2021-05-15 MED ORDER — FUROSEMIDE 10 MG/ML IJ SOLN
80.0000 mg | Freq: Two times a day (BID) | INTRAMUSCULAR | Status: DC
  Administered 2021-05-15 – 2021-05-16 (×3): 80 mg via INTRAVENOUS
  Filled 2021-05-15 (×3): qty 8

## 2021-05-15 MED ORDER — DELFLEX-LC/1.5% DEXTROSE 344 MOSM/L IP SOLN
INTRAPERITONEAL | Status: DC
  Filled 2021-05-15 (×3): qty 3000

## 2021-05-15 MED ORDER — HEPARIN 1000 UNIT/ML FOR PERITONEAL DIALYSIS
500.0000 [IU] | INTRAMUSCULAR | Status: DC | PRN
  Filled 2021-05-15: qty 0.5

## 2021-05-15 MED ORDER — BOOST / RESOURCE BREEZE PO LIQD CUSTOM
1.0000 | Freq: Three times a day (TID) | ORAL | Status: DC
  Administered 2021-05-15 – 2021-05-17 (×2): 1 via ORAL

## 2021-05-15 MED ORDER — GENTAMICIN SULFATE 0.1 % EX CREA
1.0000 "application " | TOPICAL_CREAM | Freq: Every day | CUTANEOUS | Status: DC
  Administered 2021-05-15 – 2021-05-19 (×6): 1 via TOPICAL
  Filled 2021-05-15: qty 15

## 2021-05-15 MED ORDER — PROSOURCE PLUS PO LIQD
30.0000 mL | Freq: Two times a day (BID) | ORAL | Status: DC
  Administered 2021-05-15 – 2021-05-19 (×7): 30 mL via ORAL
  Filled 2021-05-15 (×9): qty 30

## 2021-05-15 MED ORDER — DIPHENHYDRAMINE HCL 25 MG PO CAPS
25.0000 mg | ORAL_CAPSULE | Freq: Once | ORAL | Status: AC
  Administered 2021-05-15: 25 mg via ORAL
  Filled 2021-05-15: qty 1

## 2021-05-15 MED ORDER — DELFLEX-LC/2.5% DEXTROSE 394 MOSM/L IP SOLN
INTRAPERITONEAL | Status: DC
  Filled 2021-05-15 (×3): qty 3000

## 2021-05-15 MED ORDER — ACETAMINOPHEN 325 MG PO TABS
650.0000 mg | ORAL_TABLET | Freq: Once | ORAL | Status: AC
  Administered 2021-05-15: 650 mg via ORAL
  Filled 2021-05-15: qty 2

## 2021-05-15 MED ORDER — AMLODIPINE BESYLATE 5 MG PO TABS
5.0000 mg | ORAL_TABLET | Freq: Every day | ORAL | Status: DC
  Administered 2021-05-16 – 2021-05-19 (×4): 5 mg via ORAL
  Filled 2021-05-15 (×4): qty 1

## 2021-05-15 MED ORDER — SODIUM CHLORIDE 0.9% IV SOLUTION: Freq: Once | INTRAVENOUS | Status: AC

## 2021-05-15 NOTE — Progress Notes (Signed)
[facility]  ROUNDING NOTE   Subjective:   Manuel Klein is a 80 y.o. 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 He presents to the ED with increased weakness and fatigue.   Patient is followed by our clinic for peritoneal dialysis. We have been consulted to manage this treatment during admission.  Patient laying in bed Wife at bedside Per wife, patient oxygen levels low this morning and was placed on 2L O2 Patient currently denies shortness of breath Clear liquid diet   Objective:  Vital signs in last 24 hours:  Temp:  [97.7 F (36.5 C)-98.6 F (37 C)] 98.2 F (36.8 C) (06/28 0957) Pulse Rate:  [60-74] 60 (06/28 0957) Resp:  [12-25] 18 (06/28 0957) BP: (122-161)/(52-92) 145/57 (06/28 0957) SpO2:  [91 %-100 %] 99 % (06/28 0957) Weight:  [67 kg] 67 kg (06/27 1244)  Weight change:  Filed Weights   05/14/21 1244  Weight: 67 kg    Intake/Output: I/O last 3 completed shifts: In: 312 [Blood:312] Out: -    Intake/Output this shift:  Total I/O In: 750 [P.O.:750] Out: 175 [Urine:175]  Physical Exam: General: NAD  Head: Normocephalic, atraumatic. Moist oral mucosal membranes  Eyes: Anicteric  Lungs:  Clear to auscultation, normal breathing effort, O2 2L  Heart: Regular rate and rhythm  Abdomen:  Soft, nontender  Extremities:  2+ peripheral edema right leg.  Neurologic: Nonfocal, moving all four extremities  Skin: No lesions  Access: PD catheter    Basic Metabolic Panel: Recent Labs  Lab 05/11/21 1507 05/14/21 1253 05/15/21 0445  NA 136 135 136  K 3.3* 3.1* 3.7  CL 97* 97* 101  CO2 30 28 28   GLUCOSE 122* 177* 122*  BUN 57* 68* 69*  CREATININE 6.03* 5.81* 6.12*  CALCIUM 8.1* 7.9* 7.7*     Liver Function Tests: Recent Labs  Lab 05/11/21 1507 05/14/21 1253  AST 17 16  ALT 21 17  ALKPHOS 57 58  BILITOT 0.3 0.4   PROT 6.5 6.1*  ALBUMIN 2.4* 2.4*    No results for input(s): LIPASE, AMYLASE in the last 168 hours. No results for input(s): AMMONIA in the last 168 hours.  CBC: Recent Labs  Lab 05/11/21 1507 05/14/21 1253 05/15/21 0445  WBC 10.7* 10.2 8.8  NEUTROABS 6.9  --   --   HGB 6.3* 5.5* 6.8*  HCT 19.3* 16.4* 20.1*  MCV 99.0 99.4 94.8  PLT 273 251 203     Cardiac Enzymes: No results for input(s): CKTOTAL, CKMB, CKMBINDEX, TROPONINI in the last 168 hours.  BNP: Invalid input(s): POCBNP  CBG: Recent Labs  Lab 05/14/21 1837 05/15/21 0029 05/15/21 0741 05/15/21 1205  GLUCAP 152* 168* 120* 187*     Microbiology: Results for orders placed or performed during the hospital encounter of 05/14/21  Resp Panel by RT-PCR (Flu A&B, Covid) Nasopharyngeal Swab     Status: None   Collection Time: 05/14/21  4:03 PM   Specimen: Nasopharyngeal Swab; Nasopharyngeal(NP) swabs in vial transport medium  Result Value Ref Range Status   SARS Coronavirus 2 by RT PCR NEGATIVE NEGATIVE Final    Comment: (NOTE) SARS-CoV-2 target nucleic acids are NOT DETECTED.  The SARS-CoV-2 RNA is generally detectable in upper respiratory specimens during the acute phase of infection. The lowest concentration of SARS-CoV-2 viral copies this assay can detect is 138 copies/mL. A negative result does not preclude SARS-Cov-2 infection and  should not be used as the sole basis for treatment or other patient management decisions. A negative result may occur with  improper specimen collection/handling, submission of specimen other than nasopharyngeal swab, presence of viral mutation(s) within the areas targeted by this assay, and inadequate number of viral copies(<138 copies/mL). A negative result must be combined with clinical observations, patient history, and epidemiological information. The expected result is Negative.  Fact Sheet for Patients:  EntrepreneurPulse.com.au  Fact Sheet for  Healthcare Providers:  IncredibleEmployment.be  This test is no t yet approved or cleared by the [place] FDA and  has been authorized for detection and/or diagnosis of SARS-CoV-2 by FDA under an Emergency Use Authorization (EUA). This EUA will remain  in effect (meaning this test can be used) for the duration of the COVID-19 declaration under Section 564(b)(1) of the Act, 21 U.S.C.section 360bbb-3(b)(1), unless the authorization is terminated  or revoked sooner.       Influenza A by PCR NEGATIVE NEGATIVE Final   Influenza B by PCR NEGATIVE NEGATIVE Final    Comment: (NOTE) The Xpert Xpress SARS-CoV-2/FLU/RSV plus assay is intended as an aid in the diagnosis of influenza from Nasopharyngeal swab specimens and should not be used as a sole basis for treatment. Nasal washings and aspirates are unacceptable for Xpert Xpress SARS-CoV-2/FLU/RSV testing.  Fact Sheet for Patients: EntrepreneurPulse.com.au  Fact Sheet for Healthcare Providers: IncredibleEmployment.be  This test is not yet approved or cleared by the [place] FDA and has been authorized for detection and/or diagnosis of SARS-CoV-2 by FDA under an Emergency Use Authorization (EUA). This EUA will remain in effect (meaning this test can be used) for the duration of the COVID-19 declaration under Section 564(b)(1) of the Act, 21 U.S.C. section 360bbb-3(b)(1), unless the authorization is terminated or revoked.  Performed at [facility], [address] [place], [place] [address]     Coagulation Studies: No results for input(s): LABPROT, INR in the last 72 hours.  Urinalysis: No results for input(s): COLORURINE, LABSPEC, PHURINE, GLUCOSEU, HGBUR, BILIRUBINUR, KETONESUR, PROTEINUR, UROBILINOGEN, NITRITE, LEUKOCYTESUR in the last 72 hours.  Invalid input(s): APPERANCEUR    Imaging: No results found.   Medications:      [START ON  05/16/2021] amLODipine  5 mg Oral Daily   furosemide  80 mg Intravenous Q12H   insulin aspart  0-6 Units Subcutaneous TID WC   magnesium oxide  400 mg Oral Daily   metoprolol tartrate  25 mg Oral BID   multivitamin  1 tablet Oral Daily   pantoprazole (PROTONIX) IV  40 mg Intravenous Q12H   rosuvastatin  10 mg Oral Daily   sevelamer carbonate  800 mg Oral BID WC   tamsulosin  0.4 mg Oral Daily   vitamin B-12  5,000 mcg Oral Daily   acetaminophen **OR** acetaminophen, albuterol, ondansetron **OR** ondansetron (ZOFRAN) IV  Assessment/ Plan:  [name] is a 80 y.o.  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 He presents to the ED with increased weakness and fatigue.   [facility] 4 cycles, 2L fills, no last fill  End Stage Renal Disease on peritoneal dialysis: Last peritoneal dialysis 05/13/21. Tolerated well. PD catheter intact Will maintain outpatient schedule while admitted Will resume PD tonight with current outpatient prescription   2. Anemia of chronic kidney disease microcytic.  Lab Results  Component Value Date   HGB 6.8 (L) 05/15/2021  Recent history of Cancer, no EPO Hemoccult stool positive on admission Received transfusions in ED Improved but remains below target Nursing preparing to transfuse now  3. Secondary Hyperparathyroidism: with outpatient labs: calcium 7.9 on 11/15/20.   Lab Results  Component Value Date   CALCIUM 7.7 (L) 05/15/2021  Renvela with meals  4. Hypertension Home regimen includes amlodipine, hydralazine,losartan, metoprolol and furosemide. Currently receiving amlodipine and metoprolol.         LOS: 0 Viveca Beckstrom 6/28/202212:19 PM

## 2021-05-15 NOTE — Progress Notes (Signed)
Initial Nutrition Assessment  DOCUMENTATION CODES:  Severe malnutrition in context of chronic illness  INTERVENTION:  Continue to advance diet as medically able.  Add Boost Breeze po TID, each supplement provides 250 kcal and 9 grams of protein.  Add 30 ml ProSource Plus po BID, each supplement provides 100 kcal and 15 grams of protein.  Continue Rena-Vit daily.  NUTRITION DIAGNOSIS:  Severe Malnutrition related to chronic illness, cancer and cancer related treatments (ESRD on PD) as evidenced by energy intake < or equal to 75% for > or equal to 1 month, moderate fat depletion, moderate muscle depletion, severe muscle depletion.  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  PD provides some calories given use of dextrose solution. PD has not been ordered by Nephrology at this time for this evening, so unsure of exact caloric amount provided.  UOP: 175 ml/24 hrs  Spoke with pt at bedside. Pt tired this afternoon. Tray of clear liquids left untouched so far from lunch. Pt reports that his appetite has been decreased since he had his catheter for PD placed in 10/2017. He reports trying to eat what he can, but it is not much. He did not elaborate further.  He is unsure of any changes in his weight. Weight history in Epic is limited, but weight seems stable from limited history. Per Care Everywhere, weight is stable from reviewing the last 6 months.  On exam, pt with moderate depletions throughout with some severe depletions.  Given the above information, pt is severely malnourished in the chronic  setting. Pt has likely been malnourished for quite sometime.  Recommend adding Boost Breeze TID and ProSource Plus BID to promote caloric and protein intake. Also recommend continuing Rena-Vit.  Medications: reviewed; Lasix BID, SSI, Mag-Ox, Rena-Vit, Protonix, Renvela BID, Vitamin B12  Labs: reviewed; CBG 120-168 HbA1c: 6.4% (04/2021)  NUTRITION - FOCUSED PHYSICAL EXAM: Flowsheet Row Most Recent Value  Orbital Region Moderate depletion  Upper Arm Region Moderate depletion  Thoracic and Lumbar Region Mild depletion  Buccal Region Moderate depletion  Temple Region Moderate depletion  Clavicle Bone Region Severe depletion  Clavicle and Acromion Bone Region Severe depletion  Scapular Bone Region Unable to assess  Dorsal Hand Mild depletion  Patellar Region Moderate depletion  Anterior Thigh Region Mild depletion  Posterior Calf Region Mild depletion  Edema (RD Assessment) Mild  [Generalized, BLE]  Hair Reviewed  Eyes Reviewed  Mouth Reviewed  Skin Reviewed  Nails Reviewed   Diet Order:   Diet Order             Diet clear liquid Room service appropriate? Yes; Fluid consistency: Thin  Diet effective now                  EDUCATION NEEDS:  Education needs have been addressed  Skin:  Skin Assessment: Reviewed RN Assessment (Ecchymosis; catheter entry/exit site on abdomen)  Last BM:  05/14/21 - Type 5, brown/black, large  Height:  Ht Readings from Last 1 Encounters:  05/14/21 5\' 8"  (1.727 m)   Weight:  Wt Readings from Last 1 Encounters:  05/14/21 67 kg   Ideal Body Weight:  70 kg  BMI:  Body mass index is 22.46 kg/m.  Estimated Nutritional Needs:  Kcal:  2050-2250 Protein:  90-105 grams Fluid:  1000 ml + UOP  Derrel Nip, RD, LDN (she/her/hers) Registered Dietitian I After-Hours/Weekend Pager # in [place]

## 2021-05-15 NOTE — Consult Note (Signed)
Jonathon Bellows , MD [address], [place], [place], [place], [address] [address], [place], [place], [place], [address] Phone: [phone]  Fax: [phone]  Consultation  Referring Provider:     Dr Maggie Font Primary Care Physician:  Lynnell Jude, MD Primary Gastroenterologist:     None      Reason for Consultation:     Anemia   Date of Admission:  05/14/2021 Date of Consultation:  05/15/2021         HPI:   Manuel Klein is a 80 y.o. maleHistory of diabetes, end-stage renal disease on dialysis, CABG and TAVR, renal cell carcinoma status post cryoablation and CHF was incidentally noted to have a low hemoglobin.  Due to increased weakness the family brought the patient in.  In December 2021 the patient had evaluation at [facility] for anemia with EGD and colonoscopy were performed and were negative.    Admission the hemoglobin was 5.5 g with an MCV of 99.4.  3 years back to hemoglobin at 9.9 g.  In December 2021 hemoglobin was 8.4 g.  Creatinine 6.12.  Iron studies showed a low iron of 27.  Ferritin elevated 816.  He says he has had some dark stools last week but was green in color and not black. Denies any NSAID use, or blood thinners. No blood in stool, urine .   Denies any abdominal pain . No other complaints.   Past Medical History:  Diagnosis Date   Asthma    CHF (congestive heart failure) (HCC)    Chronic kidney disease    stage IV   Coronary artery disease    Diabetes mellitus without complication (HCC)    GERD (gastroesophageal reflux disease)    Hyperlipidemia    Hypertension    Myocardial infarction ([place])    Seizures ([place])    none since 20"s    Past Surgical History:  Procedure Laterality Date   CAPD INSERTION N/A 11/07/2017   Procedure: LAPAROSCOPIC INSERTION CONTINUOUS AMBULATORY PERITONEAL DIALYSIS  (CAPD) CATHETER;  Surgeon: Katha Cabal, MD;  Location: [facility] ORS;  Service: Vascular;  Laterality: N/A;   CARDIAC SURGERY     stent placement   CATARACT  EXTRACTION, BILATERAL     COLONOSCOPY     CORONARY ANGIOPLASTY     RETINAL DETACHMENT SURGERY      Prior to Admission medications   Medication Sig Start Date End Date Taking? Authorizing Provider  amLODipine (NORVASC) 5 MG tablet Take 5 mg by mouth daily.   Yes [provider]  aspirin 81 MG tablet Take 81 mg by mouth daily.  11/30/08  Yes [provider]  Cyanocobalamin 5000 MCG CAPS Take 5,000 Units by mouth daily.   Yes [provider]  furosemide (LASIX) 40 MG tablet Take 120 mg by mouth daily.   Yes [provider]  hydrALAZINE (APRESOLINE) 25 MG tablet Take 25 mg by mouth 2 (two) times daily. 02/06/21  Yes [provider]  insulin lispro (HUMALOG) 100 UNIT/ML KiwkPen Inject 6-10 Units into the skin 3 (three) times daily with meals.   Yes [provider]  levothyroxine (SYNTHROID) 100 MCG tablet Take 100 mcg by mouth daily. 04/25/21  Yes [provider]  losartan (COZAAR) 100 MG tablet Take 100 mg by mouth daily.   Yes [provider]  magnesium oxide (MAG-OX) 400 MG tablet Take 400 mg by mouth daily.   Yes [provider]  metoprolol tartrate (LOPRESSOR) 25 MG tablet Take 25 mg  by mouth 2 (two) times daily. 04/25/21  Yes [provider]  multivitamin (RENA-VIT) TABS tablet Take 1 tablet by mouth daily.   Yes [provider]  rosuvastatin (CRESTOR) 10 MG tablet Take 10 mg by mouth daily.   Yes [provider]  sevelamer carbonate (RENVELA) 800 MG tablet Take 800 mg by mouth 2 (two) times daily.   Yes [provider]  tamsulosin (FLOMAX) 0.4 MG CAPS capsule Take 0.4 mg by mouth daily.   Yes [provider]  VENTOLIN HFA 108 (90 Base) MCG/ACT inhaler Inhale 2 puffs into the lungs every 4 (four) hours as needed for wheezing or shortness of breath.   Yes [provider]    Family History  Problem Relation Age of Onset   Heart disease Mother    Diabetes Mother     Diabetes Father      Social History   Tobacco Use   Smoking status: Light Smoker    Pack years: 0.00   Smokeless tobacco: Never  Vaping Use   Vaping Use: Never used  Substance Use Topics   Alcohol use: No   Drug use: No    Allergies as of 05/14/2021 - Review Complete 05/11/2021  Allergen Reaction Noted   Hydralazine  04/12/2020   Atorvastatin  11/28/2017   Carvedilol Other (See Comments) 09/07/2014   Ciprofloxacin Rash    Other Rash 01/05/2015   Penicillins Rash and Other (See Comments)    Sulfa antibiotics Rash 01/05/2015   Sulfasalazine Rash 01/05/2015    Review of Systems:    All systems reviewed and negative except where noted in HPI.   Physical Exam:  Vital signs in last 24 hours: Temp:  [97.7 F (36.5 C)-98.6 F (37 C)] 98 F (36.7 C) (06/28 0742) Pulse Rate:  [63-74] 66 (06/28 0742) Resp:  [12-25] 16 (06/28 0742) BP: (122-161)/(52-92) 127/55 (06/28 0742) SpO2:  [91 %-99 %] 96 % (06/28 0758) Weight:  [67 kg] 67 kg (06/27 1244) Last BM Date: 05/14/21 General:   Pleasant, cooperative in NAD Head:  Normocephalic and atraumatic. Eyes:   No icterus.   Conjunctiva pink. PERRLA. Ears:  Normal auditory acuity. Neck:  Supple; no masses or thyroidomegaly Lungs: Respirations even and unlabored. Lungs clear to auscultation bilaterally.   No wheezes, crackles, or rhonchi.  Heart:  Regular rate and rhythm;  Without murmur, clicks, rubs or gallops Abdomen:  Soft, nondistended, nontender. Normal bowel sounds. No appreciable masses or hepatomegaly.  No rebound or guarding.  Neurologic:  Alert and oriented x3;  grossly normal neurologically. Psych:  Alert and cooperative. Normal affect.  LAB RESULTS: Recent Labs    05/14/21 1253 05/15/21 0445  WBC 10.2 8.8  HGB 5.5* 6.8*  HCT 16.4* 20.1*  PLT 251 203   BMET Recent Labs    05/14/21 1253 05/15/21 0445  NA 135 136  K 3.1* 3.7  CL 97* 101  CO2 28 28  GLUCOSE 177* 122*  BUN 68* 69*  CREATININE 5.81* 6.12*   CALCIUM 7.9* 7.7*   LFT Recent Labs    05/14/21 1253  PROT 6.1*  ALBUMIN 2.4*  AST 16  ALT 17  ALKPHOS 58  BILITOT 0.4   PT/INR No results for input(s): LABPROT, INR in the last 72 hours.  STUDIES: No results found.    Impression / Plan:   Manuel Klein is a 80 y.o. y/o male with whom I have been consulted for anemia. Recently at [facility] in 10/2020 had EGD+colonoscopy that were negative.  Patient is not the best historian, states he had dark green stools last week but denies blood or being black in color.    Plan  EGD tomorrow once Hb > 7 grams. Watch stool color if black and if EGD is negative will repeat colonoscopy +/- capsule study of small bowel  2. PPI   3. Monitor CBC  I have discussed alternative options, risks & benefits,  which include, but are not limited to, bleeding, infection, perforation,respiratory complication & drug reaction.  The patient agrees with this plan & written consent will be obtained.     Thank you for involving me in the care of this patient.      LOS: 0 days   Jonathon Bellows, MD  05/15/2021, 9:02 AM

## 2021-05-15 NOTE — Assessment & Plan Note (Addendum)
80 year old male patient with a history of end-stage renal disease/on peritoneal dialysis-is currently admitted to hospital for worsening anemia.  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  Baseline hemoglobin is around 9.  However, noted hemoglobin 5-6.  Suspected GI blood loss.  Patient status post units of PRBC transfusion hemoglobin around 9.  #Stage I kidney cancer-status post cryoablation  #End-stage renal disease on peritoneal dialysis  Recommendations:  #Agree with GI evaluation with EGD to rule out any upper GI bleed given the acute drop in hemoglobin.  Agree with PRBC transfusion.  #Discussed with Dr. Zollie Scale last week regarding reinitiation of erythropoietin stimulating agent in the context of stage I renal cancer.  I think the benefits of Epo agents outweigh the risk of progression from malignancy.  Defer to nephrology regarding reinitiation of iron infusion/EPO agents.  Thank you Dr.Thompson for allowing me to participate in the care of your pleasant patient. Please do not hesitate to contact me with questions or concerns in the interim.  I discussed with the patient's wife regarding above plan.  She is in agreement.

## 2021-05-15 NOTE — Consult Note (Signed)
[facility] CONSULT NOTE  Patient Care Team: Lynnell Jude, MD as PCP - General (Family Medicine)  CHIEF COMPLAINTS/PURPOSE OF CONSULTATION: Severe anemia.  HISTORY OF PRESENTING ILLNESS:  Manuel Klein 80 y.o.  male with multiple medical problems including end-stage renal disease on peritoneal dialysis; congestive heart failure diabetes-and chronic anemia secondary to CKD is currently admitted to hospital for worsening anemia.  Patient was recently seen in the hematology clinic as he was taken off EPO stimulating agent by nephrology in the context of his newly diagnosed right renal cell carcinoma.  However, on recent evaluation in the hematology clinic noted to have drop of hemoglobin from baseline of around 9-6.3.  Patient was recommended PRBC blood transfusion.  However, as per family patient was progressive getting weaker/unable to walk.  Some concerns of green-colored stool.    Patient is currently getting second PRBC transfusion.  Feels much better.   Review of Systems  Unable to perform ROS: Other (Poor historian.)    MEDICAL HISTORY:  Past Medical History:  Diagnosis Date   Asthma    CHF (congestive heart failure) ([place])    Chronic kidney disease    stage IV   Coronary artery disease    Diabetes mellitus without complication (HCC)    GERD (gastroesophageal reflux disease)    Hyperlipidemia    Hypertension    Myocardial infarction ([place])    Seizures ([place])    none since 20"s    SURGICAL HISTORY: Past Surgical History:  Procedure Laterality Date   CAPD INSERTION N/A 11/07/2017   Procedure: LAPAROSCOPIC INSERTION CONTINUOUS AMBULATORY PERITONEAL DIALYSIS  (CAPD) CATHETER;  Surgeon: Katha Cabal, MD;  Location: [facility] ORS;  Service: Vascular;  Laterality: N/A;   CARDIAC SURGERY     stent placement   CATARACT EXTRACTION, BILATERAL     COLONOSCOPY     CORONARY ANGIOPLASTY     RETINAL DETACHMENT SURGERY      SOCIAL HISTORY: Social History    Socioeconomic History   Marital status: Divorced    Spouse name: Not on file   Number of children: Not on file   Years of education: Not on file   Highest education level: Not on file  Occupational History   Not on file  Tobacco Use   Smoking status: Light Smoker    Pack years: 0.00   Smokeless tobacco: Never  Vaping Use   Vaping Use: Never used  Substance and Sexual Activity   Alcohol use: No   Drug use: No   Sexual activity: Not on file  Other Topics Concern   Not on file  Social History Narrative   Not on file   Social Determinants of Health   Financial Resource Strain: Not on file  Food Insecurity: Not on file  Transportation Needs: Not on file  Physical Activity: Not on file  Stress: Not on file  Social Connections: Not on file  Intimate Partner Violence: Not on file    FAMILY HISTORY: Family History  Problem Relation Age of Onset   Heart disease Mother    Diabetes Mother    Diabetes Father     ALLERGIES:  is allergic to hydralazine, atorvastatin, carvedilol, ciprofloxacin, other, penicillins, sulfa antibiotics, and sulfasalazine.  MEDICATIONS:  Current Facility-Administered Medications  Medication Dose Route Frequency Provider Last Rate Last Admin   (feeding supplement) PROSource Plus liquid 30 mL  30 mL Oral BID BM Eugenie Filler, MD   30 mL at 05/15/21 1540   acetaminophen (TYLENOL) tablet  650 mg  650 mg Oral Q6H PRN Agbata, Tochukwu, MD       Or   acetaminophen (TYLENOL) suppository 650 mg  650 mg Rectal Q6H PRN Agbata, Tochukwu, MD       albuterol (VENTOLIN HFA) 108 (90 Base) MCG/ACT inhaler 2 puff  2 puff Inhalation Q4H PRN Agbata, Tochukwu, MD       amLODipine (NORVASC) tablet 5 mg  5 mg Oral Daily Eugenie Filler, MD       dialysis solution 1.5% low-MG/low-CA dianeal solution   Intraperitoneal Q24H Murlean Iba, MD   New Bag at 05/15/21 1741   dialysis solution 2.5% low-MG/low-CA dianeal solution   Intraperitoneal Q24H Murlean Iba, MD    New Bag at 05/15/21 1748   feeding supplement (BOOST / RESOURCE BREEZE) liquid 1 Container  1 Container Oral TID BM Eugenie Filler, MD   1 Container at 05/15/21 1539   furosemide (LASIX) injection 80 mg  80 mg Intravenous Q12H Eugenie Filler, MD   80 mg at 05/15/21 2054   gentamicin cream (GARAMYCIN) 0.1 % 1 application  1 application Topical Daily Murlean Iba, MD   1 application at 47/09/62 2103   heparin 1000 unit/ml injection 500 Units  500 Units Intraperitoneal PRN Murlean Iba, MD       insulin aspart (novoLOG) injection 0-6 Units  0-6 Units Subcutaneous TID WC Agbata, Tochukwu, MD   1 Units at 05/15/21 1730   magnesium oxide (MAG-OX) tablet 400 mg  400 mg Oral Daily Agbata, Tochukwu, MD   400 mg at 05/15/21 0817   metoprolol tartrate (LOPRESSOR) tablet 25 mg  25 mg Oral BID Agbata, Tochukwu, MD   25 mg at 05/15/21 2054   multivitamin (RENA-VIT) tablet 1 tablet  1 tablet Oral Daily Agbata, Tochukwu, MD   1 tablet at 05/15/21 0817   ondansetron (ZOFRAN) tablet 4 mg  4 mg Oral Q6H PRN Agbata, Tochukwu, MD       Or   ondansetron (ZOFRAN) injection 4 mg  4 mg Intravenous Q6H PRN Agbata, Tochukwu, MD       pantoprazole (PROTONIX) injection 40 mg  40 mg Intravenous Q12H Agbata, Tochukwu, MD   40 mg at 05/15/21 2054   rosuvastatin (CRESTOR) tablet 10 mg  10 mg Oral Daily Agbata, Tochukwu, MD   10 mg at 05/15/21 0817   sevelamer carbonate (RENVELA) tablet 800 mg  800 mg Oral BID WC Agbata, Tochukwu, MD   800 mg at 05/15/21 1730   tamsulosin (FLOMAX) capsule 0.4 mg  0.4 mg Oral Daily Agbata, Tochukwu, MD   0.4 mg at 05/15/21 8366   vitamin B-12 (CYANOCOBALAMIN) tablet 5,000 mcg  5,000 mcg Oral Daily Agbata, Tochukwu, MD   5,000 mcg at 05/15/21 0818      .  PHYSICAL EXAMINATION:  Vitals:   05/15/21 2255 05/16/21 0513  BP: (!) 167/69 (!) 174/74  Pulse: (!) 57 62  Resp: 18 20  Temp: 98.5 F (36.9 C) 98 F (36.7 C)  SpO2: 99% 100%   Filed Weights   05/14/21 1244  Weight:  147 lb 11.3 oz (67 kg)    Physical Exam Vitals and nursing note reviewed.  Constitutional:      Comments: Patient resting in the bed.  Alone.  HENT:     Head: Normocephalic and atraumatic.     Mouth/Throat:     Mouth: Mucous membranes are moist.     Pharynx: No oropharyngeal exudate.  Eyes:     Pupils: Pupils are  equal, round, and reactive to light.  Cardiovascular:     Rate and Rhythm: Normal rate and regular rhythm.  Pulmonary:     Effort: No respiratory distress.     Breath sounds: No wheezing.     Comments: Decreased breath sounds bilaterally at bases.  No wheeze or crackles Abdominal:     General: Bowel sounds are normal. There is no distension.     Palpations: Abdomen is soft. There is no mass.     Tenderness: There is no abdominal tenderness. There is no guarding or rebound.  Musculoskeletal:        General: No tenderness. Normal range of motion.     Cervical back: Normal range of motion and neck supple.  Skin:    General: Skin is warm.  Neurological:     Mental Status: He is alert and oriented to person, place, and time.  Psychiatric:        Mood and Affect: Affect normal.        Judgment: Judgment normal.     LABORATORY DATA:  I have reviewed the data as listed Lab Results  Component Value Date   WBC 8.8 05/15/2021   HGB 10.6 (L) 05/15/2021   HCT 31.0 (L) 05/15/2021   MCV 94.8 05/15/2021   PLT 203 05/15/2021   Recent Labs    05/11/21 1507 05/14/21 1253 05/15/21 0445  NA 136 135 136  K 3.3* 3.1* 3.7  CL 97* 97* 101  CO2 30 28 28   GLUCOSE 122* 177* 122*  BUN 57* 68* 69*  CREATININE 6.03* 5.81* 6.12*  CALCIUM 8.1* 7.9* 7.7*  GFRNONAA 9* 9* 9*  PROT 6.5 6.1*  --   ALBUMIN 2.4* 2.4*  --   AST 17 16  --   ALT 21 17  --   ALKPHOS 57 58  --   BILITOT 0.3 0.4  --     RADIOGRAPHIC STUDIES: I have personally reviewed the radiological images as listed and agreed with the findings in the report. No results found.  Absolute anemia 80 year old male  patient with a history of end-stage renal disease/on peritoneal dialysis-is currently admitted to hospital for worsening anemia.  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  Baseline hemoglobin is around 9.  However, noted hemoglobin 5-6.  Suspected GI blood loss.  Patient status post units of PRBC transfusion hemoglobin around 9.  #Stage I kidney cancer-status post cryoablation  #End-stage renal disease on peritoneal dialysis  Recommendations:  #Agree with GI evaluation with EGD to rule out any upper GI bleed given the acute drop in hemoglobin.  Agree with PRBC transfusion.  #Discussed with Dr. Zollie Scale last week regarding reinitiation of erythropoietin stimulating agent in the context of stage I renal cancer.  I think the benefits of Epo agents outweigh the risk of progression from malignancy.  Defer to nephrology regarding reinitiation of iron infusion/EPO agents.  Thank you Dr.Thompson for allowing me to participate in the care of your pleasant patient. Please do not hesitate to contact me with questions or concerns in the interim.  I discussed with the patient's wife regarding above plan.  She is in agreement.     Cammie Sickle, MD 05/16/2021 7:15 AM

## 2021-05-15 NOTE — Progress Notes (Signed)
PROGRESS NOTE    Manuel Klein  VQQ:[id number] DOB: [birth year] DOA: 05/14/2021 PCP: Lynnell Jude, MD    Chief Complaint  Patient presents with   Anemia    Brief Narrative:  Patient 80 year old gentleman history of diabetes, end-stage renal disease on peritoneal dialysis, CAD status post CABG, status post valve replacement, hypertension, RCC status post cryoablation and CHF presented to the ED with generalized weakness. Patient noted to have a history of anemia seen in hematology clinic couple of days prior to admission noted to have a hemoglobin of 6.3 and scheduled for outpatient transfusion however due to increasing weakness, dizziness, lightheadedness, fall prior to admission patient brought to the ED.  Patient noted on presentation to have a hemoglobin of 5.5 and admitted and transfused 2 units packed red blood cells.  Patient noted to have melanotic stools.  GI consulted.  Nephrology consulted.   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  Melanotic stools -Patient with a history of ESRD on PD with associated anemia of chronic disease. -Patient noted to be on EPO on 12/31/2019 when he was diagnosed with renal cell carcinoma. -Patient followed up with hematology in the outpatient setting noted to have a hemoglobin of 6.3 couple days prior to admission and due to ongoing melanotic stools, progressive generalized weakness, dizziness and lightheadedness presented to the ED noted to have a hemoglobin of 5.5. -Anemia panel with iron level of 27, TIBC of 258, ferritin of 816. -Status post  transfusion 2 units packed red blood cells hemoglobin currently at 6.8. -Transfused 2 units packed red blood cells. -Placed on Lasix 80 mg IV every 12 hours. -Transfusion threshold hemoglobin < 8.  -Currently on clears. -GI consulted for further evaluation and management.  2.  Type 2 diabetes mellitus with complications of ESRD on PD -Patient seen in consultation by nephrology. -Patient for PD tonight. -Placed on Lasix 80 mg IV every 12 hours. -Continue clears. -Hemoglobin A1c 6.4. -CBG 120 this morning. -SSI.  3.  ESRD on PD -Nephrology consulted and following.  4.  Hypertension -Hold Norvasc this morning. -Continue home regimen Lopressor -Lasix 80 mg IV every 12 hours. -Continue to hold Cozaar and hydralazine.  5.  Coronary artery disease status post CABG -Aspirin on hold secondary to problem #1. -Continue Lopressor, statin. -IV Lasix.  6.  Hypokalemia -Repleted. -Potassium at 3.7. -On PD. -Per nephrology.  7.  Chronic diastolic CHF -Patient status post transfusion 2 units packed red blood cells currently on O2. -Patient with some crackles noted on examination. -Patient to receive another 2 units of packed red blood cells. -Placed on Lasix 80 mg IV every 12 hours. -Patient on PD. -Nephrology following.    DVT prophylaxis: SCDs Code Status: Full Family Communication: Updated wife at bedside Disposition:   Status is:     Dispo: The patient is from: Home              Anticipated d/c is to:  TBD              Patient currently is not medically stable to d/c.   Difficult to place  patient No       Consultants:  Nephrology: Dr. Candiss Norse Hematology/oncology: Dr. Rogue Bussing GI pending  Procedures:  Transfusion 2 units packed red blood cells 05/14/2021 Transfusion 2 units packed red Blood cells pending 05/15/2021  Antimicrobials:  None   Subjective: Sitting on bedside commode having a bowel movement.  Patient denies any significant change with  generalized weakness.  Denies any significant change with shortness of breath.  Denies any chest pain.  No abdominal pain.  Denies any NSAID use, no history of peptic ulcer disease, no history of aspirin use.  Wife at bedside. Per RN stool was brown and black.  Wife states patient has had some melanotic stools for a few weeks now.  Objective: Vitals:   05/15/21 1241 05/15/21 1243 05/15/21 1318 05/15/21 1546  BP: (!) 153/63  (!) 154/61 (!) 151/66  Pulse: (!) 59  (!) 58 61  Resp: 18  18 16   Temp: 97.8 F (36.6 C)  97.8 F (36.6 C) 97.7 F (36.5 C)  TempSrc:  Oral Oral   SpO2: 100%  100% 97%  Weight:      Height:        Intake/Output Summary (Last 24 hours) at 05/15/2021 1717 Last data filed at 05/15/2021 1541 Gross per 24 hour  Intake 1748 ml  Output 175 ml  Net 1573 ml   Filed Weights   05/14/21 1244  Weight: 67 kg    Examination:  General exam: Appears calm and comfortable  Respiratory system: Scattered crackles.  No wheezing.  Fair air movement.  Cardiovascular system: S1 & S2 heard, RRR. No JVD, murmurs, rubs, gallops or clicks. No pedal edema. Gastrointestinal system: Abdomen is nondistended, soft and nontender. No organomegaly or masses felt. Normal bowel sounds heard. Central nervous system: Alert and oriented. No focal neurological deficits. Extremities: Symmetric 5 x 5 power. Skin: No rashes, lesions or ulcers Psychiatry: Judgement and insight appear normal. Mood & affect appropriate.     Data Reviewed: I have personally reviewed following labs and imaging studies  CBC: Recent Labs  Lab 05/11/21 1507 05/14/21 1253 05/15/21 0445  WBC 10.7* 10.2 8.8  NEUTROABS 6.9  --   --   HGB 6.3* 5.5* 6.8*  HCT 19.3* 16.4* 20.1*  MCV 99.0 99.4 94.8  PLT 273 251 626    Basic Metabolic Panel: Recent Labs  Lab 05/11/21 1507 05/14/21 1253 05/15/21 0445  NA 136 135 136  K 3.3* 3.1* 3.7  CL 97* 97* 101  CO2 30 28 28   GLUCOSE 122* 177* 122*  BUN 57* 68* 69*   CREATININE 6.03* 5.81* 6.12*  CALCIUM 8.1* 7.9* 7.7*    GFR: Estimated Creatinine Clearance: 9.3 mL/min (A) (by C-G formula based on SCr of 6.12 mg/dL (H)).  Liver Function Tests: Recent Labs  Lab 05/11/21 1507 05/14/21 1253  AST 17 16  ALT 21 17  ALKPHOS 57 58  BILITOT 0.3 0.4  PROT 6.5 6.1*  ALBUMIN 2.4* 2.4*    CBG: Recent Labs  Lab 05/14/21 1837 05/15/21 0029 05/15/21 0741 05/15/21 1205 05/15/21 1547  GLUCAP 152* 168* 120* 187* 153*     Recent Results (from the past 240 hour(s))  Resp Panel by RT-PCR (Flu A&B, Covid) Nasopharyngeal Swab     Status: None   Collection Time: 05/14/21  4:03 PM   Specimen: Nasopharyngeal Swab; Nasopharyngeal(NP) swabs in vial transport medium  Result Value Ref Range Status   SARS Coronavirus 2 by RT PCR NEGATIVE NEGATIVE Final    Comment: (NOTE) SARS-CoV-2  target nucleic acids are NOT DETECTED.  The SARS-CoV-2 RNA is generally detectable in upper respiratory specimens during the acute phase of infection. The lowest concentration of SARS-CoV-2 viral copies this assay can detect is 138 copies/mL. A negative result does not preclude SARS-Cov-2 infection and should not be used as the sole basis for treatment or other patient management decisions. A negative result may occur with  improper specimen collection/handling, submission of specimen other than nasopharyngeal swab, presence of viral mutation(s) within the areas targeted by this assay, and inadequate number of viral copies(<138 copies/mL). A negative result must be combined with clinical observations, patient history, and epidemiological information. The expected result is Negative.  Fact Sheet for Patients:  EntrepreneurPulse.com.au  Fact Sheet for Healthcare Providers:  IncredibleEmployment.be  This test is no t yet approved or cleared by the [place] FDA and  has been authorized for detection and/or diagnosis of SARS-CoV-2  by FDA under an Emergency Use Authorization (EUA). This EUA will remain  in effect (meaning this test can be used) for the duration of the COVID-19 declaration under Section 564(b)(1) of the Act, 21 U.S.C.section 360bbb-3(b)(1), unless the authorization is terminated  or revoked sooner.       Influenza A by PCR NEGATIVE NEGATIVE Final   Influenza B by PCR NEGATIVE NEGATIVE Final    Comment: (NOTE) The Xpert Xpress SARS-CoV-2/FLU/RSV plus assay is intended as an aid in the diagnosis of influenza from Nasopharyngeal swab specimens and should not be used as a sole basis for treatment. Nasal washings and aspirates are unacceptable for Xpert Xpress SARS-CoV-2/FLU/RSV testing.  Fact Sheet for Patients: EntrepreneurPulse.com.au  Fact Sheet for Healthcare Providers: IncredibleEmployment.be  This test is not yet approved or cleared by the [place] FDA and has been authorized for detection and/or diagnosis of SARS-CoV-2 by FDA under an Emergency Use Authorization (EUA). This EUA will remain in effect (meaning this test can be used) for the duration of the COVID-19 declaration under Section 564(b)(1) of the Act, 21 U.S.C. section 360bbb-3(b)(1), unless the authorization is terminated or revoked.  Performed at [facility], [address] [place], [place] [address]          Radiology Studies: No results found.      Scheduled Meds:  (feeding supplement) PROSource Plus  30 mL Oral BID BM   [START ON 05/16/2021] amLODipine  5 mg Oral Daily   feeding supplement  1 Container Oral TID BM   furosemide  80 mg Intravenous Q12H   gentamicin cream  1 application Topical Daily   insulin aspart  0-6 Units Subcutaneous TID WC   magnesium oxide  400 mg Oral Daily   metoprolol tartrate  25 mg Oral BID   multivitamin  1 tablet Oral Daily   pantoprazole (PROTONIX) IV  40 mg Intravenous Q12H   rosuvastatin  10 mg Oral Daily   sevelamer  carbonate  800 mg Oral BID WC   tamsulosin  0.4 mg Oral Daily   vitamin B-12  5,000 mcg Oral Daily   Continuous Infusions:  dialysis solution 1.5% low-MG/low-CA     dialysis solution 2.5% low-MG/low-CA       LOS: 0 days    Time spent: 40 minutes    Irine Seal, MD [facility]   To contact the attending provider between 7A-7P or the covering provider during after hours 7P-7A, please log into the web site [email or web address] and access using universal [place] password for that web site. If you do not have the  password, please call the hospital operator.  05/15/2021, 5:17 PM

## 2021-05-16 ENCOUNTER — Inpatient Hospital Stay: Payer: Medicare Other | Admitting: Anesthesiology

## 2021-05-16 ENCOUNTER — Encounter
Admission: EM | Disposition: A | Discharge status: Home Health | Payer: Self-pay | Source: Home / Self Care | Attending: Internal Medicine

## 2021-05-16 DIAGNOSIS — K310 Acute dilatation of stomach: Secondary | ICD-10-CM

## 2021-05-16 DIAGNOSIS — Z992 Dependence on renal dialysis: Secondary | ICD-10-CM

## 2021-05-16 DIAGNOSIS — N186 End stage renal disease: Secondary | ICD-10-CM

## 2021-05-16 DIAGNOSIS — K2900 Acute gastritis without bleeding: Secondary | ICD-10-CM

## 2021-05-16 DIAGNOSIS — D631 Anemia in chronic kidney disease: Secondary | ICD-10-CM

## 2021-05-16 DIAGNOSIS — K922 Gastrointestinal hemorrhage, unspecified: Secondary | ICD-10-CM

## 2021-05-16 HISTORY — PX: ESOPHAGOGASTRODUODENOSCOPY (EGD) WITH PROPOFOL: SHX5813

## 2021-05-16 LAB — BPAM RBC
Blood Product Expiration Date: 202207262359
Blood Product Expiration Date: 202207262359
Blood Product Expiration Date: 202207262359
Blood Product Expiration Date: 202207302359
ISSUE DATE / TIME: 202206271521
ISSUE DATE / TIME: 202206272356
ISSUE DATE / TIME: 202206280928
ISSUE DATE / TIME: 202206281258
Unit Type and Rh: 6200
Unit Type and Rh: 6200
Unit Type and Rh: 6200
Unit Type and Rh: 6200

## 2021-05-16 LAB — BASIC METABOLIC PANEL
Anion gap: 12 (ref 5–15)
BUN: 65 mg/dL — ABNORMAL HIGH (ref 8–23)
CO2: 26 mmol/L (ref 22–32)
Calcium: 8.3 mg/dL — ABNORMAL LOW (ref 8.9–10.3)
Chloride: 99 mmol/L (ref 98–111)
Creatinine, Ser: 6.15 mg/dL — ABNORMAL HIGH (ref 0.61–1.24)
GFR, Estimated: 9 mL/min — ABNORMAL LOW (ref >60)
Glucose, Bld: 113 mg/dL — ABNORMAL HIGH (ref 70–99)
Potassium: 3.8 mmol/L (ref 3.5–5.1)
Sodium: 137 mmol/L (ref 135–145)

## 2021-05-16 LAB — TYPE AND SCREEN
ABO/RH(D): A POS
Antibody Screen: NEGATIVE
Unit division: 0
Unit division: 0
Unit division: 0
Unit division: 0

## 2021-05-16 LAB — GLUCOSE, CAPILLARY
Glucose-Capillary: 107 mg/dL — ABNORMAL HIGH (ref 70–99)
Glucose-Capillary: 138 mg/dL — ABNORMAL HIGH (ref 70–99)
Glucose-Capillary: 213 mg/dL — ABNORMAL HIGH (ref 70–99)
Glucose-Capillary: 128 mg/dL — ABNORMAL HIGH (ref 70–99)

## 2021-05-16 SURGERY — ESOPHAGOGASTRODUODENOSCOPY (EGD) WITH PROPOFOL
Anesthesia: General

## 2021-05-16 MED ORDER — SODIUM CHLORIDE 0.9 % IV SOLN
INTRAVENOUS | Status: DC
  Administered 2021-05-16: 10 mL/h via INTRAVENOUS

## 2021-05-16 MED ORDER — PROPOFOL 10 MG/ML IV BOLUS
INTRAVENOUS | Status: DC | PRN
  Administered 2021-05-16: 30 mg via INTRAVENOUS
  Administered 2021-05-16: 20 mg via INTRAVENOUS

## 2021-05-16 MED ORDER — MAGNESIUM OXIDE -MG SUPPLEMENT 400 (240 MG) MG PO TABS
400.0000 mg | ORAL_TABLET | Freq: Every day | ORAL | Status: DC
  Administered 2021-05-17 – 2021-05-19 (×3): 400 mg via ORAL
  Filled 2021-05-16 (×3): qty 1

## 2021-05-16 MED ORDER — PROPOFOL 500 MG/50ML IV EMUL
INTRAVENOUS | Status: DC | PRN
  Administered 2021-05-16: 125 ug/kg/min via INTRAVENOUS

## 2021-05-16 MED ORDER — GLYCOPYRROLATE 0.2 MG/ML IJ SOLN
INTRAMUSCULAR | Status: DC | PRN
  Administered 2021-05-16: .2 mg via INTRAVENOUS

## 2021-05-16 MED ORDER — LIDOCAINE HCL (CARDIAC) PF 100 MG/5ML IV SOSY
PREFILLED_SYRINGE | INTRAVENOUS | Status: DC | PRN
  Administered 2021-05-16: 100 mg via INTRAVENOUS

## 2021-05-16 MED ORDER — LEVOTHYROXINE SODIUM 100 MCG PO TABS
100.0000 ug | ORAL_TABLET | Freq: Every day | ORAL | Status: DC
  Administered 2021-05-17 – 2021-05-19 (×3): 100 ug via ORAL
  Filled 2021-05-16 (×3): qty 1

## 2021-05-16 MED ORDER — FUROSEMIDE 80 MG PO TABS
120.0000 mg | ORAL_TABLET | Freq: Every day | ORAL | Status: DC
  Administered 2021-05-17 – 2021-05-19 (×3): 120 mg via ORAL
  Filled 2021-05-16 (×3): qty 1

## 2021-05-16 NOTE — Anesthesia Postprocedure Evaluation (Signed)
Anesthesia Post Note  Patient: Manuel Klein  Procedure(s) Performed: ESOPHAGOGASTRODUODENOSCOPY (EGD) WITH PROPOFOL  Patient location during evaluation: Endoscopy Anesthesia Type: General Level of consciousness: awake and alert Pain management: pain level controlled Vital Signs Assessment: post-procedure vital signs reviewed and stable Respiratory status: spontaneous breathing, nonlabored ventilation, respiratory function stable and patient connected to nasal cannula oxygen Cardiovascular status: blood pressure returned to baseline and stable Postop Assessment: no apparent nausea or vomiting Anesthetic complications: no   No notable events documented.   Last Vitals:  Vitals:   05/16/21 1116 05/16/21 1233  BP: (!) 137/50   Pulse: (!) 59   Resp: 20   Temp: 36.4 C (!) 36.2 C  SpO2: 95%     Last Pain:  Vitals:   05/16/21 1233  TempSrc: Temporal  PainSc:                  Precious Haws Tezra Mahr

## 2021-05-16 NOTE — Progress Notes (Signed)
Patient PD machine ran out of fluids during the start of cycle 3. Supervisor for dialysis nurses couldn't get anyone to come in tonight so they will come in morning to ensure patient gets the rest of his treatment.

## 2021-05-16 NOTE — Evaluation (Signed)
Physical Therapy Evaluation Patient Details Name: Manuel Klein MRN: [id number] DOB: Sep 22, 1941 Today's Date: 05/16/2021   History of Present Illness  80 year old gentleman history of diabetes, end-stage renal disease on peritoneal dialysis, CAD status post CABG, status post valve replacement, hypertension, RCC status post cryoablation and CHF presented to the ED with generalized weakness.  Patient noted to have a history of anemia seen in hematology clinic couple of days prior to admission noted to have a hemoglobin of 6.3 and scheduled for outpatient transfusion however due to increasing weakness, dizziness, lightheadedness, fall prior to admission patient brought to the ED.  Patient noted on presentation to have a hemoglobin of 5.5, now greatly improved s/p transfusions.  Clinical Impression  Pt had been very weak with knees buckling, etc leading up to this hospitalization and even after transfusions seemed doubtful that he would be able to do a lot with PT.  However, he ultimately did quite well, though still requiring a RW (as opposed to his baseline SPC).  He was on 1L O2 on arrival with sats in the high 90s, able to maintain 90s on room air during bed mobility/testing as well as during circumambulaion of the nurses' station.  Pt is not at his baseline and will benefit from continued HHPT, but is feeling much more confident about mobility/ability to go home when medically ready for d/c.    Follow Up Recommendations Home health PT    Equipment Recommendations  None recommended by PT    Recommendations for Other Services       Precautions / Restrictions Precautions Precautions: Fall Restrictions Weight Bearing Restrictions: No      Mobility  Bed Mobility Overal bed mobility: Modified Independent                  Transfers Overall transfer level: Modified independent Equipment used: Rolling walker (2 wheeled)             General transfer comment: Pt was able to rise  to standing w/o hesitation and showed good safety and minimal reliance on UEs  Ambulation/Gait Ambulation/Gait assistance: Supervision Gait Distance (Feet): 200 Feet Assistive device: Rolling walker (2 wheeled)       General Gait Details: Pt showed ability to maintain consistent and confident cadence the entire loop around the nurses' station.  Pt was not reliant on the walker, maintained O2 in the 90s on room air and reported only minimal fatigue with the effort.  No LOBs or safety concerns.  Stairs            Wheelchair Mobility    Modified Rankin (Stroke Patients Only)       Balance Overall balance assessment: Modified Independent                                           Pertinent Vitals/Pain Pain Assessment: No/denies pain (just reports some soreness from being in bed)    Home Living Family/patient expects to be discharged to:: Private residence Living Arrangements: Spouse/significant other Available Help at Discharge: Available 24 hours/day Type of Home: House Home Access: Stairs to enter Entrance Stairs-Rails: Right Entrance Stairs-Number of Steps: 3 Home Layout: One level Home Equipment: [place] - 2 wheels;Cane - single point      Prior Function Level of Independence: Independent with assistive device(s)         Comments: typically pt normally needs just a SPC,  however due to recent weakness needing RW     Hand Dominance        Extremity/Trunk Assessment   Upper Extremity Assessment Upper Extremity Assessment: Overall WFL for tasks assessed    Lower Extremity Assessment Lower Extremity Assessment: Overall WFL for tasks assessed       Communication   Communication: No difficulties  Cognition Arousal/Alertness: Awake/alert Behavior During Therapy: WFL for tasks assessed/performed Overall Cognitive Status: Within Functional Limits for tasks assessed                                        General  Comments      Exercises     Assessment/Plan    PT Assessment Patient needs continued PT services  PT Problem List Decreased strength;Decreased range of motion;Decreased activity tolerance;Cardiopulmonary status limiting activity;Decreased safety awareness       PT Treatment Interventions DME instruction;Gait training;Stair training;Therapeutic activities;Therapeutic exercise;Balance training;Neuromuscular re-education    PT Goals (Current goals can be found in the Care Plan section)  Acute Rehab PT Goals Patient Stated Goal: go home PT Goal Formulation: With patient Time For Goal Achievement: 05/30/21    Frequency Min 2X/week   Barriers to discharge        Co-evaluation               AM-PAC PT "6 Clicks" Mobility  Outcome Measure Help needed turning from your back to your side while in a flat bed without using bedrails?: None Help needed moving from lying on your back to sitting on the side of a flat bed without using bedrails?: None Help needed moving to and from a bed to a chair (including a wheelchair)?: None Help needed standing up from a chair using your arms (e.g., wheelchair or bedside chair)?: None Help needed to walk in hospital room?: None Help needed climbing 3-5 steps with a railing? : A Little 6 Click Score: 23    End of Session Equipment Utilized During Treatment: Gait belt Activity Tolerance: Patient tolerated treatment well Patient left: with bed alarm set;with call bell/phone within reach;with family/visitor present Nurse Communication: Mobility status (took off 1L O2 and maintained 90s t/o session) PT Visit Diagnosis: Muscle weakness (generalized) (M62.81);Difficulty in walking, not elsewhere classified (R26.2)    Time: 1410-1433 PT Time Calculation (min) (ACUTE ONLY): 23 min   Charges:   PT Evaluation $PT Eval Low Complexity: 1 Low PT Treatments $Gait Training: 8-22 mins        Kreg Shropshire, DPT 05/16/2021, 3:53 PM

## 2021-05-16 NOTE — Anesthesia Preprocedure Evaluation (Signed)
Anesthesia Evaluation  Patient identified by MRN, date of birth, ID band Patient awake    Reviewed: Allergy & Precautions, NPO status , Patient's Chart, lab work & pertinent test results  History of Anesthesia Complications Negative for: history of anesthetic complications  Airway Mallampati: III  TM Distance: >3 FB Neck ROM: limited    Dental  (+) Chipped   Pulmonary asthma , Current Smoker and Patient abstained from smoking.,    Pulmonary exam normal        Cardiovascular hypertension, (-) angina+ CAD, + Past MI and +CHF  Normal cardiovascular exam     Neuro/Psych Seizures -,  negative psych ROS   GI/Hepatic negative GI ROS, Neg liver ROS, GERD  ,  Endo/Other  negative endocrine ROSdiabetes, Type 2  Renal/GU DialysisRenal disease  negative genitourinary   Musculoskeletal   Abdominal   Peds  Hematology negative hematology ROS (+)   Anesthesia Other Findings Patient is NPO appropriate and reports no nausea or vomiting today.  Past Medical History: No date: Asthma No date: CHF (congestive heart failure) (HCC) No date: Chronic kidney disease     Comment:  stage IV No date: Coronary artery disease No date: Diabetes mellitus without complication (HCC) No date: GERD (gastroesophageal reflux disease) No date: Hyperlipidemia No date: Hypertension No date: Myocardial infarction ([place]) No date: Seizures ([place])     Comment:  none since 20"s  Past Surgical History: 11/07/2017: CAPD INSERTION; N/A     Comment:  Procedure: LAPAROSCOPIC INSERTION CONTINUOUS AMBULATORY               PERITONEAL DIALYSIS  (CAPD) CATHETER;  Surgeon: Katha Cabal, MD;  Location: [facility] ORS;  Service: Vascular;                Laterality: N/A; No date: CARDIAC SURGERY     Comment:  stent placement No date: CATARACT EXTRACTION, BILATERAL No date: COLONOSCOPY No date: CORONARY ANGIOPLASTY No date: RETINAL DETACHMENT  SURGERY  BMI    Body Mass Index: 22.46 kg/m      Reproductive/Obstetrics negative OB ROS                             Anesthesia Physical Anesthesia Plan  ASA: 4  Anesthesia Plan: General   Post-op Pain Management:    Induction: Intravenous  PONV Risk Score and Plan: Propofol infusion and TIVA  Airway Management Planned: Natural Airway and Nasal Cannula  Additional Equipment:   Intra-op Plan:   Post-operative Plan:   Informed Consent: I have reviewed the patients History and Physical, chart, labs and discussed the procedure including the risks, benefits and alternatives for the proposed anesthesia with the patient or authorized representative who has indicated his/her understanding and acceptance.     Dental Advisory Given  Plan Discussed with: Anesthesiologist, CRNA and Surgeon  Anesthesia Plan Comments: (Patient consented for risks of anesthesia including but not limited to:  - adverse reactions to medications - risk of airway placement if required - damage to eyes, teeth, lips or other oral mucosa - nerve damage due to positioning  - sore throat or hoarseness - Damage to heart, brain, nerves, lungs, other parts of body or loss of life  Patient voiced understanding.)        Anesthesia Quick Evaluation

## 2021-05-16 NOTE — Progress Notes (Signed)
PROGRESS NOTE  Manuel Klein  DOB: 1941-05-03  PCP: Manuel Jude, MD KGU:[id number]  DOA: 05/14/2021  LOS: 1 day  Hospital Day: 3   Chief Complaint  Patient presents with   Anemia    Brief narrative: Manuel Klein is a 80 y.o. male with PMH significant for ESRD on peritoneal dialysis, diabetes mellitus, CAD status post CABG, valve replacement, hypertension, renal cell cancer status post collaboration, CHF. Patient presented to the ED on 6/27 with generalized weakness, dizziness, lightheadedness. Few days prior, he was noted to have a low hemoglobin of 6.3 and scheduled for outpatient transfusion but because of worsening symptoms, patient was brought to the ED. In the ED, hemoglobin was low at 5.5, 2 unit of PRBC transfused. Admitted to hospitalist service.  GI consultation obtained. 6/20, patient underwent EGD.  Subjective: Patient was seen and examined this morning.  Lying on bed.  Not in distress.  Wife at bedside.  Patient was waiting for EGD at the time.  Assessment/Plan: Severe symptomatic anemia -Probably related to ESRD, suspected GI bleeding as well based on melanotic stool. -Hemoglobin at 5.5 on presentation.  2 units of PRBCs transfused.  Hemoglobin improved to 10.6 on repeat on 6/28. Recent Labs    05/11/21 1507 05/14/21 1253 05/15/21 0445 05/15/21 1728  HGB 6.3* 5.5* 6.8* 10.6*  MCV 99.0 99.4 94.8  --   FERRITIN 816*  --   --   --   TIBC 244* 258  --   --   IRON 38* 27*  --   --   RETICCTPCT 2.5  --   --   --    Suspect GI bleeding -EGD 6/29 with erythematous gastric folds.  No ulcer. -GI plans to do a capsule endoscopy tomorrow.  Chronic diastolic CHF Essential hypertension -Home meds include metoprolol 25 mg daily, Lasix 120 mg daily, losartan 1 mg daily, amlodipine 5 mg daily, hydralazine 25 mg twice daily -Continue metoprolol.  Currently on IV Lasix.  Switch to oral Lasix home dose. -Losartan, amlodipine and hydralazine remain on hold  Type 2  diabetes mellitus -A1c 6.4 on 6/27 Recent Labs  Lab 05/15/21 0741 05/15/21 1205 05/15/21 1547 05/15/21 2119 05/16/21 0731  GLUCAP 120* 187* 153* 216* 107*   ESRD on PD -Nephrology consulted and following.  Coronary artery disease status post CABG -Continue Lopressor, statin.  Aspirin on hold because of bleeding.    Mobility: Encourage ambulation.  PT eval ordered Code Status:   Code Status: Full Code  Nutritional status: Body mass index is 22.46 kg/m. Nutrition Problem: Severe Malnutrition Etiology: chronic illness, cancer and cancer related treatments (ESRD on PD) Signs/Symptoms: energy intake < or equal to 75% for > or equal to 1 month, moderate fat depletion, moderate muscle depletion, severe muscle depletion Diet Order             Diet renal with fluid restriction Fluid restriction: 1200 mL Fluid; Room service appropriate? Yes; Fluid consistency: Thin  Diet effective now                   DVT prophylaxis:  SCDs Start: 05/14/21 1553   Antimicrobials: None Fluid: None Consultants: GI Family Communication: Wife at bedside  Status is: Inpatient  Remains inpatient appropriate because: Capsule endoscopy tomorrow  Dispo: The patient is from: Home              Anticipated d/c is to: Home in 1 to 2 days  Patient currently is not medically stable to d/c.   Difficult to place patient No     Infusions:   sodium chloride 10 mL/hr (05/16/21 1120)   [MAR Hold] dialysis solution 1.5% low-MG/low-CA     [MAR Hold] dialysis solution 2.5% low-MG/low-CA      Scheduled Meds:  [MAR Hold] (feeding supplement) PROSource Plus  30 mL Oral BID BM   [MAR Hold] amLODipine  5 mg Oral Daily   [MAR Hold] feeding supplement  1 Container Oral TID BM   [START ON 05/17/2021] furosemide  120 mg Oral Daily   [MAR Hold] gentamicin cream  1 application Topical Daily   [MAR Hold] insulin aspart  0-6 Units Subcutaneous TID WC   [MAR Hold] magnesium oxide  400 mg Oral Daily    [MAR Hold] metoprolol tartrate  25 mg Oral BID   [MAR Hold] multivitamin  1 tablet Oral Daily   [MAR Hold] pantoprazole (PROTONIX) IV  40 mg Intravenous Q12H   [MAR Hold] rosuvastatin  10 mg Oral Daily   [MAR Hold] sevelamer carbonate  800 mg Oral BID WC   [MAR Hold] tamsulosin  0.4 mg Oral Daily   [MAR Hold] vitamin B-12  5,000 mcg Oral Daily    Antimicrobials: Anti-infectives (From admission, onward)    None       PRN meds: [MAR Hold] acetaminophen **OR** [MAR Hold] acetaminophen, [MAR Hold] albuterol, [MAR Hold] heparin, [MAR Hold] ondansetron **OR** [MAR Hold] ondansetron (ZOFRAN) IV   Objective: Vitals:   05/16/21 1116 05/16/21 1233  BP: (!) 137/50   Pulse: (!) 59   Resp: 20   Temp: 97.6 F (36.4 C) (!) 97.1 F (36.2 C)  SpO2: 95%     Intake/Output Summary (Last 24 hours) at 05/16/2021 1248 Last data filed at 05/16/2021 1226 Gross per 24 hour  Intake 3198 ml  Output 550 ml  Net 2648 ml   Filed Weights   05/14/21 1244  Weight: 67 kg   Weight change:  Body mass index is 22.46 kg/m.   Physical Exam: General exam: Pleasant, elderly Caucasian male.  Not in distress Skin: No rashes, lesions or ulcers. HEENT: Atraumatic, normocephalic, no obvious bleeding Lungs: Clear to auscultation bilaterally CVS: Regular rate and rhythm, no murmur GI/Abd soft, nontender, nondistended, bowel sound present CNS: Alert, awake and oriented x3 Psychiatry: Mood appropriate Extremities: No edema, no calf tenderness  Data Review: I have personally reviewed the laboratory data and studies available.  Recent Labs  Lab 05/11/21 1507 05/14/21 1253 05/15/21 0445 05/15/21 1728  WBC 10.7* 10.2 8.8  --   NEUTROABS 6.9  --   --   --   HGB 6.3* 5.5* 6.8* 10.6*  HCT 19.3* 16.4* 20.1* 31.0*  MCV 99.0 99.4 94.8  --   PLT 273 251 203  --    Recent Labs  Lab 05/11/21 1507 05/14/21 1253 05/15/21 0445 05/16/21 0804  NA 136 135 136 137  K 3.3* 3.1* 3.7 3.8  CL 97* 97* 101 99   CO2 30 28 28 26   GLUCOSE 122* 177* 122* 113*  BUN 57* 68* 69* 65*  CREATININE 6.03* 5.81* 6.12* 6.15*  CALCIUM 8.1* 7.9* 7.7* 8.3*    F/u labs ordered Unresulted Labs (From admission, onward)     Start     Ordered   05/17/21 0500  CBC with Differential/Platelet  Daily,   R      05/16/21 1248   05/17/21 8127  Basic metabolic panel  Daily,   R  05/16/21 1248            Signed, Terrilee Croak, MD [facility] 05/16/2021

## 2021-05-16 NOTE — Progress Notes (Signed)
[facility]  ROUNDING NOTE   Subjective:   Manuel Klein is a 80 y.o. 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 He presents to the ED with increased weakness and fatigue.   Patient is followed by our clinic for peritoneal dialysis. We have been consulted to manage this treatment during admission.  Patient seen prior to GI procedure Sitting at side of bed, wife at bedside Currently NPO Room air   Objective:  Vital signs in last 24 hours:  Temp:  [97.1 F (36.2 C)-98.5 F (36.9 C)] 97.1 F (36.2 C) (06/29 1233) Pulse Rate:  [57-64] 59 (06/29 1116) Resp:  [16-20] 20 (06/29 1116) BP: (137-174)/(50-74) 137/50 (06/29 1116) SpO2:  [95 %-100 %] 95 % (06/29 1116)  Weight change:  Filed Weights   05/14/21 1244  Weight: 67 kg    Intake/Output: I/O last 3 completed shifts: In: 4670 [P.O.:870; Blood:3800] Out: 525 [Urine:525]   Intake/Output this shift:  Total I/O In: 300 [I.V.:300] Out: 200 [Urine:200]  Physical Exam: General: NAD  Head: Normocephalic, atraumatic. Moist oral mucosal membranes  Eyes: Anicteric  Lungs:  Clear to auscultation, normal breathing effort, room air  Heart: Regular rate and rhythm  Abdomen:  Soft, nontender  Extremities:  2+ peripheral edema right leg.  Neurologic: Nonfocal, moving all four extremities  Skin: No lesions  Access: PD catheter    Basic Metabolic Panel: Recent Labs  Lab 05/11/21 1507 05/14/21 1253 05/15/21 0445 05/16/21 0804  NA 136 135 136 137  K 3.3* 3.1* 3.7 3.8  CL 97* 97* 101 99  CO2 30 28 28 26   GLUCOSE 122* 177* 122* 113*  BUN 57* 68* 69* 65*  CREATININE 6.03* 5.81* 6.12* 6.15*  CALCIUM 8.1* 7.9* 7.7* 8.3*     Liver Function Tests: Recent Labs  Lab 05/11/21 1507 05/14/21 1253  AST 17 16  ALT 21 17  ALKPHOS 57 58  BILITOT 0.3 0.4  PROT 6.5 6.1*  ALBUMIN 2.4* 2.4*     No results for input(s): LIPASE, AMYLASE in the last 168 hours. No results for input(s): AMMONIA in the last 168 hours.  CBC: Recent Labs  Lab 05/11/21 1507 05/14/21 1253 05/15/21 0445 05/15/21 1728  WBC 10.7* 10.2 8.8  --   NEUTROABS 6.9  --   --   --   HGB 6.3* 5.5* 6.8* 10.6*  HCT 19.3* 16.4* 20.1* 31.0*  MCV 99.0 99.4 94.8  --   PLT 273 251 203  --      Cardiac Enzymes: No results for input(s): CKTOTAL, CKMB, CKMBINDEX, TROPONINI in the last 168 hours.  BNP: Invalid input(s): POCBNP  CBG: Recent Labs  Lab 05/15/21 0741 05/15/21 1205 05/15/21 1547 05/15/21 2119 05/16/21 0731  GLUCAP 120* 187* 153* 216* 107*     Microbiology: Results for orders placed or performed during the hospital encounter of 05/14/21  Resp Panel by RT-PCR (Flu A&B, Covid) Nasopharyngeal Swab     Status: None   Collection Time: 05/14/21  4:03 PM   Specimen: Nasopharyngeal Swab; Nasopharyngeal(NP) swabs in vial transport medium  Result Value Ref Range Status   SARS Coronavirus 2 by RT PCR NEGATIVE NEGATIVE Final    Comment: (NOTE) SARS-CoV-2 target nucleic acids are NOT DETECTED.  The SARS-CoV-2 RNA is generally detectable in upper respiratory specimens during the acute phase of infection. The lowest concentration of SARS-CoV-2 viral copies this assay can detect is 138  copies/mL. A negative result does not preclude SARS-Cov-2 infection and should not be used as the sole basis for treatment or other patient management decisions. A negative result may occur with  improper specimen collection/handling, submission of specimen other than nasopharyngeal swab, presence of viral mutation(s) within the areas targeted by this assay, and inadequate number of viral copies(<138 copies/mL). A negative result must be combined with clinical observations, patient history, and epidemiological information. The expected result is Negative.  Fact Sheet for Patients:   EntrepreneurPulse.com.au  Fact Sheet for Healthcare Providers:  IncredibleEmployment.be  This test is no t yet approved or cleared by the [place] FDA and  has been authorized for detection and/or diagnosis of SARS-CoV-2 by FDA under an Emergency Use Authorization (EUA). This EUA will remain  in effect (meaning this test can be used) for the duration of the COVID-19 declaration under Section 564(b)(1) of the Act, 21 U.S.C.section 360bbb-3(b)(1), unless the authorization is terminated  or revoked sooner.       Influenza A by PCR NEGATIVE NEGATIVE Final   Influenza B by PCR NEGATIVE NEGATIVE Final    Comment: (NOTE) The Xpert Xpress SARS-CoV-2/FLU/RSV plus assay is intended as an aid in the diagnosis of influenza from Nasopharyngeal swab specimens and should not be used as a sole basis for treatment. Nasal washings and aspirates are unacceptable for Xpert Xpress SARS-CoV-2/FLU/RSV testing.  Fact Sheet for Patients: EntrepreneurPulse.com.au  Fact Sheet for Healthcare Providers: IncredibleEmployment.be  This test is not yet approved or cleared by the [place] FDA and has been authorized for detection and/or diagnosis of SARS-CoV-2 by FDA under an Emergency Use Authorization (EUA). This EUA will remain in effect (meaning this test can be used) for the duration of the COVID-19 declaration under Section 564(b)(1) of the Act, 21 U.S.C. section 360bbb-3(b)(1), unless the authorization is terminated or revoked.  Performed at [facility], [address] [place], [place] [address]     Coagulation Studies: No results for input(s): LABPROT, INR in the last 72 hours.  Urinalysis: No results for input(s): COLORURINE, LABSPEC, PHURINE, GLUCOSEU, HGBUR, BILIRUBINUR, KETONESUR, PROTEINUR, UROBILINOGEN, NITRITE, LEUKOCYTESUR in the last 72 hours.  Invalid input(s): APPERANCEUR    Imaging: No  results found.   Medications:    sodium chloride 10 mL/hr (05/16/21 1120)   [MAR Hold] dialysis solution 1.5% low-MG/low-CA     [MAR Hold] dialysis solution 2.5% low-MG/low-CA       [MAR Hold] (feeding supplement) PROSource Plus  30 mL Oral BID BM   [MAR Hold] amLODipine  5 mg Oral Daily   [MAR Hold] feeding supplement  1 Container Oral TID BM   [START ON 05/17/2021] furosemide  120 mg Oral Daily   [MAR Hold] gentamicin cream  1 application Topical Daily   [MAR Hold] insulin aspart  0-6 Units Subcutaneous TID WC   [MAR Hold] magnesium oxide  400 mg Oral Daily   [MAR Hold] metoprolol tartrate  25 mg Oral BID   [MAR Hold] multivitamin  1 tablet Oral Daily   [MAR Hold] pantoprazole (PROTONIX) IV  40 mg Intravenous Q12H   [MAR Hold] rosuvastatin  10 mg Oral Daily   [MAR Hold] sevelamer carbonate  800 mg Oral BID WC   [MAR Hold] tamsulosin  0.4 mg Oral Daily   [MAR Hold] vitamin B-12  5,000 mcg Oral Daily   [MAR Hold] acetaminophen **OR** [MAR Hold] acetaminophen, [MAR Hold] albuterol, [MAR Hold] heparin, [MAR Hold] ondansetron **OR** [MAR Hold] ondansetron (ZOFRAN) IV  Assessment/ Plan:  Mr. BILLYJOE GO is a 80 y.o.  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 He presents to the ED with increased weakness and fatigue.   [facility] 4 cycles, 2L fills, no last fill  End Stage Renal Disease on peritoneal dialysis: Last peritoneal dialysis 05/13/21. Tolerated well. PD catheter intact Will maintain outpatient schedule while admitted Received shortened treatment last night, tolerated well   2. Anemia of chronic kidney disease microcytic.  Lab Results  Component Value Date   HGB 10.6 (L) 05/15/2021   Recent history of Cancer, no EPO Hemoccult stool positive on admission Received transfusions in ED Hgb at goal EDG completed by Gi-no active bleeding,  biopsies taken of enlarged gastric folds. Small bowel video endo tomorrow  3. Secondary Hyperparathyroidism: with outpatient labs: calcium 7.9 on 11/15/20.   Lab Results  Component Value Date   CALCIUM 8.3 (L) 05/16/2021  Renvela with meals  4. Hypertension Home regimen includes amlodipine, hydralazine,losartan, metoprolol and furosemide. Currently receiving amlodipine and metoprolol. Elevated. Will continue to monitor        LOS: 1 [place] 6/29/20221:10 PM

## 2021-05-16 NOTE — Op Note (Signed)
[facility] Patient Name: Manuel Klein Procedure Date: 05/16/2021 12:13 PM MRN: [id number] Account #: [id number] Date of Birth: [birth year] Admit Type: Inpatient Age: 80 Room: [facility] ENDO ROOM 3 Gender: Male Note Status: Finalized Procedure:             Upper GI endoscopy Indications:           Gastrointestinal bleeding of unknown origin Providers:             Jonathon Bellows MD, MD Referring MD:          Lynnell Jude (Referring MD) Medicines:             Monitored Anesthesia Care Complications:         No immediate complications. Procedure:             Pre-Anesthesia Assessment:                        - Prior to the procedure, a History and Physical was                         performed, and patient medications, allergies and                         sensitivities were reviewed. The patient's tolerance                         of previous anesthesia was reviewed.                        - The risks and benefits of the procedure and the                         sedation options and risks were discussed with the                         patient. All questions were answered and informed                         consent was obtained.                        - ASA Grade Assessment: III - A patient with severe                         systemic disease.                        After obtaining informed consent, the endoscope was                         passed under direct vision. Throughout the procedure,                         the patient's blood pressure, pulse, and oxygen                         saturations were monitored continuously. The Endoscope                         was introduced through the  mouth, and advanced to the                         third part of duodenum. The upper GI endoscopy was                         accomplished without difficulty. The patient tolerated                         the procedure well. Findings:      The esophagus was normal.       The examined duodenum was normal.      Localized thick gastric folds were found in the gastric fundus. Biopsies       were taken with a cold forceps for histology. Mucosa appears       erythematous over the area      The cardia and gastric fundus were normal on retroflexion. Impression:            - Normal esophagus.                        - Normal examined duodenum.                        - Enlarged gastric folds. Biopsied. Recommendation:        - Return patient to hospital ward for ongoing care.                        - Advance diet as tolerated.                        - Continue present medications.                        - Await pathology results.                        - To visualize the small bowel, perform video capsule                         endoscopy tomorrow. Procedure Code(s):     --- Professional ---                        [phone], Esophagogastroduodenoscopy, flexible,                         transoral; with biopsy, single or multiple Diagnosis Code(s):     --- Professional ---                        K29.60, Other gastritis without bleeding                        K92.2, Gastrointestinal hemorrhage, unspecified CPT copyright 2019 American Medical Association. All rights reserved. The codes documented in this report are preliminary and upon coder review may  be revised to meet current compliance requirements. Jonathon Bellows, MD Jonathon Bellows MD, MD 05/16/2021 12:33:06 PM This report has been signed electronically. Number of Addenda: 0 Note Initiated On: 05/16/2021 12:13 PM Estimated Blood Loss:  Estimated blood loss: none.      [facility]

## 2021-05-16 NOTE — Anesthesia Procedure Notes (Signed)
Procedure Name: General with mask airway Date/Time: 05/16/2021 12:29 PM Performed by: Kelton Pillar, CRNA Pre-anesthesia Checklist: Patient identified, Emergency Drugs available, Suction available and Patient being monitored Patient Re-evaluated:Patient Re-evaluated prior to induction Oxygen Delivery Method: Simple face mask Induction Type: IV induction Placement Confirmation: positive ETCO2 and CO2 detector Dental Injury: Teeth and Oropharynx as per pre-operative assessment

## 2021-05-16 NOTE — Transfer of Care (Signed)
Immediate Anesthesia Transfer of Care Note  Patient: Manuel Klein  Procedure(s) Performed: ESOPHAGOGASTRODUODENOSCOPY (EGD) WITH PROPOFOL  Patient Location: Endoscopy Unit  Anesthesia Type:General  Level of Consciousness: drowsy and patient cooperative  Airway & Oxygen Therapy: Patient Spontanous Breathing and Patient connected to face mask oxygen  Post-op Assessment: Report given to RN and Post -op Vital signs reviewed and stable  Post vital signs: Reviewed and stable  Last Vitals:  Vitals Value Taken Time  BP 121/44 05/16/21 1234  Temp 36.2 C 05/16/21 1233  Pulse 57 05/16/21 1234  Resp 17 05/16/21 1234  SpO2 100 % 05/16/21 1234  Vitals shown include unvalidated device data.  Last Pain:  Vitals:   05/16/21 1233  TempSrc: Temporal  PainSc:          Complications: No notable events documented.

## 2021-05-16 NOTE — H&P (Signed)
Manuel Bellows, MD [address], [place], [place], [place], [address] [address], [place], [place], [place], [address] Phone: [phone]  Fax: [phone]  Primary Care Physician:  Lynnell Jude, MD   Pre-Procedure History & Physical: HPI:  Manuel Klein is a 80 y.o. male is here for an endoscopy    Past Medical History:  Diagnosis Date   Asthma    CHF (congestive heart failure) ([place])    Chronic kidney disease    stage IV   Coronary artery disease    Diabetes mellitus without complication ([place])    GERD (gastroesophageal reflux disease)    Hyperlipidemia    Hypertension    Myocardial infarction ([place])    Seizures ([place])    none since 20"s    Past Surgical History:  Procedure Laterality Date   CAPD INSERTION N/A 11/07/2017   Procedure: LAPAROSCOPIC INSERTION CONTINUOUS AMBULATORY PERITONEAL DIALYSIS  (CAPD) CATHETER;  Surgeon: Katha Cabal, MD;  Location: [facility] ORS;  Service: Vascular;  Laterality: N/A;   CARDIAC SURGERY     stent placement   CATARACT EXTRACTION, BILATERAL     COLONOSCOPY     CORONARY ANGIOPLASTY     RETINAL DETACHMENT SURGERY      Prior to Admission medications   Medication Sig Start Date End Date Taking? Authorizing Provider  amLODipine (NORVASC) 5 MG tablet Take 5 mg by mouth daily.   Yes [provider]  aspirin 81 MG tablet Take 81 mg by mouth daily.  11/30/08  Yes [provider]  Cyanocobalamin 5000 MCG CAPS Take 5,000 Units by mouth daily.   Yes [provider]  furosemide (LASIX) 40 MG tablet Take 120 mg by mouth daily.   Yes [provider]  hydrALAZINE (APRESOLINE) 25 MG tablet Take 25 mg by mouth 2 (two) times daily. 02/06/21  Yes [provider]  insulin lispro (HUMALOG) 100 UNIT/ML KiwkPen Inject 6-10 Units into the skin 3 (three) times daily with meals.   Yes [provider]  levothyroxine (SYNTHROID) 100 MCG tablet Take 100 mcg by mouth daily. 04/25/21  Yes [provider]  losartan (COZAAR) 100 MG tablet Take 100 mg by mouth daily.   Yes [provider]  magnesium oxide (MAG-OX) 400 MG tablet Take 400 mg by mouth daily.   Yes [provider]  metoprolol tartrate (LOPRESSOR) 25 MG tablet Take 25 mg by mouth 2 (two) times daily. 04/25/21  Yes [provider]  multivitamin (RENA-VIT) TABS tablet Take 1 tablet by mouth daily.   Yes [provider]  rosuvastatin (CRESTOR) 10 MG tablet Take 10 mg by mouth daily.   Yes [provider]  sevelamer carbonate (RENVELA) 800 MG tablet Take 800 mg by mouth 2 (two) times daily.   Yes [provider]  tamsulosin (FLOMAX) 0.4 MG CAPS capsule Take 0.4 mg by mouth daily.   Yes [provider]  VENTOLIN HFA 108 (90 Base) MCG/ACT inhaler Inhale 2 puffs into the lungs every 4 (four) hours as needed for wheezing or shortness of breath.   Yes [provider]    Allergies as of 05/14/2021 - Review Complete 05/11/2021  Allergen Reaction Noted   Hydralazine  04/12/2020   Atorvastatin  11/28/2017   Carvedilol Other (See Comments) 09/07/2014   Ciprofloxacin Rash    Other Rash 01/05/2015   Penicillins Rash and Other (See Comments)    Sulfa antibiotics Rash 01/05/2015   Sulfasalazine Rash 01/05/2015    Family  History  Problem Relation Age of Onset   Heart disease Mother    Diabetes Mother    Diabetes Father     Social History   Socioeconomic History   Marital status: Divorced    Spouse name: Not on file   Number of children: Not on file   Years of education: Not on file   Highest education level: Not on file  Occupational History   Not on file  Tobacco Use   Smoking status: Light Smoker    Pack years: 0.00   Smokeless tobacco: Never  Vaping Use   Vaping Use: Never used  Substance and Sexual Activity   Alcohol use: No   Drug use: No   Sexual activity: Not on file  Other Topics Concern   Not on file  Social History Narrative   Not  on file   Social Determinants of Health   Financial Resource Strain: Not on file  Food Insecurity: Not on file  Transportation Needs: Not on file  Physical Activity: Not on file  Stress: Not on file  Social Connections: Not on file  Intimate Partner Violence: Not on file    Review of Systems: See HPI, otherwise negative ROS  Physical Exam: BP (!) 137/50   Pulse (!) 59   Temp 97.6 F (36.4 C) (Temporal)   Resp 20   Ht 5\' 8"  (1.727 m)   Wt 67 kg   SpO2 95%   BMI 22.46 kg/m  General:   Alert,  pleasant and cooperative in NAD Head:  Normocephalic and atraumatic. Neck:  Supple; no masses or thyromegaly. Lungs:  Clear throughout to auscultation, normal respiratory effort.    Heart:  +S1, +S2, Regular rate and rhythm, No edema. Abdomen:  Soft, nontender and nondistended. Normal bowel sounds, without guarding, and without rebound.   Neurologic:  Alert and  oriented x4;  grossly normal neurologically.  Impression/Plan: Manuel Klein is here for an endoscopy  to be performed for  evaluation of anemia    Risks, benefits, limitations, and alternatives regarding endoscopy have been reviewed with the patient.  Questions have been answered.  All parties agreeable.   Manuel Bellows, MD  05/16/2021, 12:18 PM

## 2021-05-17 ENCOUNTER — Encounter: Payer: Self-pay | Admitting: Gastroenterology

## 2021-05-17 LAB — CBC WITH DIFFERENTIAL/PLATELET
Abs Immature Granulocytes: 0.02 10*3/uL (ref 0.00–0.07)
Basophils Absolute: 0.1 10*3/uL (ref 0.0–0.1)
Basophils Relative: 1 %
Eosinophils Absolute: 1.2 10*3/uL — ABNORMAL HIGH (ref 0.0–0.5)
Eosinophils Relative: 15 %
HCT: 28.3 % — ABNORMAL LOW (ref 39.0–52.0)
Hemoglobin: 9.8 g/dL — ABNORMAL LOW (ref 13.0–17.0)
Immature Granulocytes: 0 %
Lymphocytes Relative: 8 %
Lymphs Abs: 0.6 10*3/uL — ABNORMAL LOW (ref 0.7–4.0)
MCH: 32.2 pg (ref 26.0–34.0)
MCHC: 34.6 g/dL (ref 30.0–36.0)
MCV: 93.1 fL (ref 80.0–100.0)
Monocytes Absolute: 1.1 10*3/uL — ABNORMAL HIGH (ref 0.1–1.0)
Monocytes Relative: 13 %
Neutro Abs: 5 10*3/uL (ref 1.7–7.7)
Neutrophils Relative %: 63 %
Platelets: 206 10*3/uL (ref 150–400)
RBC: 3.04 MIL/uL — ABNORMAL LOW (ref 4.22–5.81)
RDW: 13.6 % (ref 11.5–15.5)
WBC: 8 10*3/uL (ref 4.0–10.5)
nRBC: 0 % (ref 0.0–0.2)

## 2021-05-17 LAB — GLUCOSE, CAPILLARY
Glucose-Capillary: 153 mg/dL — ABNORMAL HIGH (ref 70–99)
Glucose-Capillary: 168 mg/dL — ABNORMAL HIGH (ref 70–99)
Glucose-Capillary: 143 mg/dL — ABNORMAL HIGH (ref 70–99)
Glucose-Capillary: 297 mg/dL — ABNORMAL HIGH (ref 70–99)

## 2021-05-17 LAB — BASIC METABOLIC PANEL
Anion gap: 7 (ref 5–15)
BUN: 60 mg/dL — ABNORMAL HIGH (ref 8–23)
CO2: 27 mmol/L (ref 22–32)
Calcium: 7.8 mg/dL — ABNORMAL LOW (ref 8.9–10.3)
Chloride: 100 mmol/L (ref 98–111)
Creatinine, Ser: 5.82 mg/dL — ABNORMAL HIGH (ref 0.61–1.24)
GFR, Estimated: 9 mL/min — ABNORMAL LOW (ref >60)
Glucose, Bld: 197 mg/dL — ABNORMAL HIGH (ref 70–99)
Potassium: 3.5 mmol/L (ref 3.5–5.1)
Sodium: 134 mmol/L — ABNORMAL LOW (ref 135–145)

## 2021-05-17 LAB — PHOSPHORUS: Phosphorus: 4.6 mg/dL (ref 2.5–4.6)

## 2021-05-17 LAB — SURGICAL PATHOLOGY

## 2021-05-17 MED ORDER — INSULIN ASPART 100 UNIT/ML IJ SOLN
0.0000 [IU] | Freq: Three times a day (TID) | INTRAMUSCULAR | Status: DC
Start: 2021-05-17 — End: 2021-05-19
  Administered 2021-05-17: 3 [IU] via SUBCUTANEOUS
  Administered 2021-05-18: 4 [IU] via SUBCUTANEOUS
  Administered 2021-05-19: 1 [IU] via SUBCUTANEOUS
  Filled 2021-05-17: qty 1

## 2021-05-17 MED ORDER — DELFLEX-LC/1.5% DEXTROSE 344 MOSM/L IP SOLN
INTRAPERITONEAL | Status: DC
  Filled 2021-05-17: qty 3000

## 2021-05-17 MED ORDER — EPOETIN ALFA 10000 UNIT/ML IJ SOLN
10000.0000 [IU] | INTRAMUSCULAR | Status: DC
Start: 2021-05-17 — End: 2021-05-19
  Administered 2021-05-17: 10000 [IU] via SUBCUTANEOUS
  Filled 2021-05-17: qty 1

## 2021-05-17 NOTE — Progress Notes (Signed)
PROGRESS NOTE  Manuel Klein  DOB: 06/07/1941  PCP: Lynnell Jude, MD ZYS:[id number]  DOA: 05/14/2021  LOS: 2 days  Hospital Day: 4   Chief Complaint  Patient presents with   Anemia    Brief narrative: Manuel Klein is a 80 y.o. male with PMH significant for ESRD on peritoneal dialysis, diabetes mellitus, CAD status post CABG, valve replacement, hypertension, renal cell cancer status post collaboration, CHF. Patient presented to the ED on 6/27 with generalized weakness, dizziness, lightheadedness. Few days prior, he was noted to have a low hemoglobin of 6.3 and scheduled for outpatient transfusion but because of worsening symptoms, patient was brought to the ED. In the ED, hemoglobin was low at 5.5, 2 unit of PRBC transfused. Admitted to hospitalist service.  GI consultation obtained. 6/20, patient underwent EGD.  Subjective: Patient was seen and examined this morning.  Sitting up in bed.  Not in distress.  No new symptoms or wife at bedside.   Assessment/Plan: Severe symptomatic anemia -Probably related to ESRD, suspected GI bleeding as well based on melanotic stool. -Hemoglobin at 5.5 on presentation.  2 units of PRBCs transfused.  Hemoglobin improved to 9.8 on repeat this morning. Recent Labs    05/11/21 1507 05/14/21 1253 05/15/21 0445 05/15/21 1728 05/17/21 0515  HGB 6.3* 5.5* 6.8* 10.6* 9.8*  MCV 99.0 99.4 94.8  --  93.1  FERRITIN 816*  --   --   --   --   TIBC 244* 258  --   --   --   IRON 38* 27*  --   --   --   RETICCTPCT 2.5  --   --   --   --    Suspect GI bleeding -EGD 6/29 with erythematous gastric folds.  No ulcer. -GI plans to do a capsule endoscopy tomorrow.   Chronic diastolic CHF Essential hypertension -Home meds include metoprolol 25 mg daily, Lasix 120 mg daily, losartan 1 mg daily, amlodipine 5 mg daily, hydralazine 25 mg twice daily -Currently on metoprolol and oral Lasix. -Losartan, amlodipine and hydralazine remain on hold  Type 2  diabetes mellitus -A1c 6.4 on 6/27 Recent Labs  Lab 05/16/21 0731 05/16/21 1342 05/16/21 1626 05/16/21 2200 05/17/21 0744  GLUCAP 107* 138* 213* 128* 153*    ESRD on PD -Nephrology consulted and following.  Coronary artery disease status post CABG -Continue Lopressor, statin.  Aspirin on hold because of bleeding.    Mobility: Encourage ambulation.  PT eval obtained.  Home health PT recommended. Code Status:   Code Status: Full Code  Nutritional status: Body mass index is 23.72 kg/m. Nutrition Problem: Severe Malnutrition Etiology: chronic illness, cancer and cancer related treatments (ESRD on PD) Signs/Symptoms: energy intake < or equal to 75% for > or equal to 1 month, moderate fat depletion, moderate muscle depletion, severe muscle depletion Diet Order             Diet heart healthy/carb modified Room service appropriate? Yes; Fluid consistency: Thin  Diet effective now                   DVT prophylaxis:  SCDs Start: 05/14/21 1553   Antimicrobials: None Fluid: None Consultants: GI Family Communication: Wife at bedside  Status is: Inpatient  Remains inpatient appropriate because: Capsule endoscopy tomorrow  Dispo: The patient is from: Home              Anticipated d/c is to: Home in 1 to 2 days  Patient currently is not medically stable to d/c.   Difficult to place patient No     Infusions:   dialysis solution 1.5% low-MG/low-CA     dialysis solution 2.5% low-MG/low-CA      Scheduled Meds:  (feeding supplement) PROSource Plus  30 mL Oral BID BM   amLODipine  5 mg Oral Daily   feeding supplement  1 Container Oral TID BM   furosemide  120 mg Oral Daily   gentamicin cream  1 application Topical Daily   insulin aspart  0-6 Units Subcutaneous TID WC   levothyroxine  100 mcg Oral Q0600   magnesium oxide  400 mg Oral Daily   metoprolol tartrate  25 mg Oral BID   multivitamin  1 tablet Oral Daily   pantoprazole (PROTONIX) IV  40 mg  Intravenous Q12H   rosuvastatin  10 mg Oral Daily   sevelamer carbonate  800 mg Oral BID WC   tamsulosin  0.4 mg Oral Daily   vitamin B-12  5,000 mcg Oral Daily    Antimicrobials: Anti-infectives (From admission, onward)    None       PRN meds: acetaminophen **OR** acetaminophen, albuterol, heparin, ondansetron **OR** ondansetron (ZOFRAN) IV   Objective: Vitals:   05/17/21 0453 05/17/21 0746  BP: (!) 165/62 (!) 159/61  Pulse: 62 62  Resp: 16 16  Temp: 98.6 F (37 C) 98.3 F (36.8 C)  SpO2: 96% 94%    Intake/Output Summary (Last 24 hours) at 05/17/2021 1118 Last data filed at 05/17/2021 1052 Gross per 24 hour  Intake 9800 ml  Output 8111 ml  Net 1689 ml    Filed Weights   05/14/21 1244 05/17/21 0500  Weight: 67 kg 70.8 kg   Weight change:  Body mass index is 23.72 kg/m.   Physical Exam: General exam: Pleasant, elderly Caucasian male.  Not in distress Skin: No rashes, lesions or ulcers. HEENT: Atraumatic, normocephalic, no obvious bleeding Lungs: Clear to auscultation bilaterally CVS: Regular rate and rhythm, no murmur GI/Abd soft, nontender, nondistended, bowel sound present CNS: Alert, awake and oriented x3 Psychiatry: Mood appropriate Extremities: No edema, no calf tenderness  Data Review: I have personally reviewed the laboratory data and studies available.  Recent Labs  Lab 05/11/21 1507 05/14/21 1253 05/15/21 0445 05/15/21 1728 05/17/21 0515  WBC 10.7* 10.2 8.8  --  8.0  NEUTROABS 6.9  --   --   --  5.0  HGB 6.3* 5.5* 6.8* 10.6* 9.8*  HCT 19.3* 16.4* 20.1* 31.0* 28.3*  MCV 99.0 99.4 94.8  --  93.1  PLT 273 251 203  --  206    Recent Labs  Lab 05/11/21 1507 05/14/21 1253 05/15/21 0445 05/16/21 0804 05/17/21 0515  NA 136 135 136 137 134*  K 3.3* 3.1* 3.7 3.8 3.5  CL 97* 97* 101 99 100  CO2 30 28 28 26 27   GLUCOSE 122* 177* 122* 113* 197*  BUN 57* 68* 69* 65* 60*  CREATININE 6.03* 5.81* 6.12* 6.15* 5.82*  CALCIUM 8.1* 7.9* 7.7*  8.3* 7.8*     F/u labs ordered Unresulted Labs (From admission, onward)     Start     Ordered   05/17/21 0500  CBC with Differential/Platelet  Daily,   R      05/16/21 1248   05/17/21 2353  Basic metabolic panel  Daily,   R      05/16/21 1248            Signed, Terrilee Croak, MD [facility] 05/17/2021

## 2021-05-17 NOTE — Progress Notes (Signed)
GI note  Capsule study machine not available today , will perform on Friday . Await biopsies from EGD. Monitor CBC and if has evidence of active bleeding get tagged RBC scan   Dr Jonathon Bellows MD,MRCP [facility]) Gastroenterology/Hepatology Pager: [phone]

## 2021-05-17 NOTE — TOC Initial Note (Signed)
Transition of Care [facility]) - Initial/Assessment Note    Patient Details  Name: Manuel Klein MRN: [id number] Date of Birth: Mar 07, 1941  Transition of Care [facility]) CM/SW Contact:    Beverly Sessions, RN Phone Number: 05/17/2021, 9:50 AM  Clinical Narrative:                 Patient admitted from home with anemia Wife at bedside  Patient lives at home with wife PCP [facility] - denies issues obtaining medications  Wife transports to all appointments Performs PD at home  PT has assessed patient and recommends home health Per wife Patient patient active with [facility] home health.  Malachy Mood with [facility] notified of admission. Will need resumption orders at discharge. Patent has RW and cane in the home.  Plan for capsule study tomorrow   Expected Discharge Plan: [place] Barriers to Discharge: Continued Medical Work up   Patient Goals and CMS Choice        Expected Discharge Plan and Services Expected Discharge Plan: [place]       Living arrangements for the past 2 months: Single Family Home                           HH Arranged: PT HH Agency: [place] Date [place]: 05/16/21   Representative spoke with at [place]: Malachy Mood  Prior Living Arrangements/Services Living arrangements for the past 2 months: [place] Lives with:: Spouse Patient language and need for interpreter reviewed:: Yes Do you feel safe going back to the place where you live?: Yes      Need for Family Participation in Patient Care: Yes (Comment) Care giver support system in place?: Yes (comment) Current home services: DME Criminal Activity/Legal Involvement Pertinent to Current Situation/Hospitalization: No - Comment as needed  Activities of Daily Living Home Assistive Devices/Equipment: Cane (specify quad or straight), Shower chair with back, Walker (specify type) ADL Screening (condition at time of  admission) Patient's cognitive ability adequate to safely complete daily activities?: Yes Is the patient deaf or have difficulty hearing?: Yes Does the patient have difficulty seeing, even when wearing glasses/contacts?: No Does the patient have difficulty concentrating, remembering, or making decisions?: Yes Patient able to express need for assistance with ADLs?: Yes Does the patient have difficulty dressing or bathing?: No Independently performs ADLs?: Yes (appropriate for developmental age) Does the patient have difficulty walking or climbing stairs?: Yes Weakness of Legs: Both Weakness of Arms/Hands: None  Permission Sought/Granted                  Emotional Assessment       Orientation: : Oriented to Self, Oriented to Place, Oriented to  Time, Oriented to Situation Alcohol / Substance Use: Not Applicable Psych Involvement: No (comment)  Admission diagnosis:  Severe anemia [D64.9] Other iron deficiency anemia [D50.8] Patient Active Problem List   Diagnosis Date Noted   Protein-calorie malnutrition, severe 05/15/2021   Hypokalemia    ESRD (end stage renal disease) ([place])    Chronic diastolic CHF (congestive heart failure) ([place])    Absolute anemia 05/14/2021   Coronary artery disease    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[place]) 05/11/2021   Symptomatic anemia 05/11/2021   Hyperlipidemia 11/03/2017   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[place]) 11/03/2017   Essential hypertension 11/03/2017   PCP:  Lynnell Jude,  MD Pharmacy:   [facility] [place], [place] [facility] [address] AT [address] [place] [facility] [place] [address] Phone: [phone] Fax: [phone]     Social Determinants of Health (SDOH) Interventions    Readmission Risk Interventions No flowsheet data found.

## 2021-05-17 NOTE — Progress Notes (Addendum)
[facility]  ROUNDING NOTE   Subjective:   Manuel Klein is a 80 y.o. 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 He presents to the ED with increased weakness and fatigue.   Patient is followed by our clinic for peritoneal dialysis. We have been consulted to manage this treatment during admission.  Patient seen laying in bed Wife at bedside EGD yesterday, no active bleeding Capsule study tomorrow PD went well last night Tolerating meals  Objective:  Vital signs in last 24 hours:  Temp:  [97.1 F (36.2 C)-98.6 F (37 C)] 98.3 F (36.8 C) (06/30 0746) Pulse Rate:  [59-65] 62 (06/30 0746) Resp:  [16-18] 16 (06/30 0746) BP: (150-165)/(57-64) 159/61 (06/30 0746) SpO2:  [94 %-99 %] 94 % (06/30 0746) Weight:  [70.8 kg] 70.8 kg (06/30 0500)  Weight change:  Filed Weights   05/14/21 1244 05/17/21 0500  Weight: 67 kg 70.8 kg    Intake/Output: I/O last 3 completed shifts: In: 9560 [P.O.:1260; I.V.:300; Other:8000] Out: 8661 [Urine:550; Other:8111]   Intake/Output this shift:  Total I/O In: 240 [P.O.:240] Out: -   Physical Exam: General: NAD, sitting at bedside  Head: Normocephalic, atraumatic. Moist oral mucosal membranes  Eyes: Anicteric  Lungs:  Clear to auscultation, normal breathing effort, room air  Heart: Regular rate and rhythm  Abdomen:  Soft, nontender  Extremities:  trace peripheral edema   Neurologic: Nonfocal, moving all four extremities  Skin: No lesions  Access: PD catheter    Basic Metabolic Panel: Recent Labs  Lab 05/11/21 1507 05/14/21 1253 05/15/21 0445 05/16/21 0804 05/17/21 0515  NA 136 135 136 137 134*  K 3.3* 3.1* 3.7 3.8 3.5  CL 97* 97* 101 99 100  CO2 30 28 28 26 27   GLUCOSE 122* 177* 122* 113* 197*  BUN 57* 68* 69* 65* 60*  CREATININE 6.03* 5.81* 6.12* 6.15* 5.82*  CALCIUM 8.1* 7.9* 7.7* 8.3*  7.8*     Liver Function Tests: Recent Labs  Lab 05/11/21 1507 05/14/21 1253  AST 17 16  ALT 21 17  ALKPHOS 57 58  BILITOT 0.3 0.4  PROT 6.5 6.1*  ALBUMIN 2.4* 2.4*    No results for input(s): LIPASE, AMYLASE in the last 168 hours. No results for input(s): AMMONIA in the last 168 hours.  CBC: Recent Labs  Lab 05/11/21 1507 05/14/21 1253 05/15/21 0445 05/15/21 1728 05/17/21 0515  WBC 10.7* 10.2 8.8  --  8.0  NEUTROABS 6.9  --   --   --  5.0  HGB 6.3* 5.5* 6.8* 10.6* 9.8*  HCT 19.3* 16.4* 20.1* 31.0* 28.3*  MCV 99.0 99.4 94.8  --  93.1  PLT 273 251 203  --  206     Cardiac Enzymes: No results for input(s): CKTOTAL, CKMB, CKMBINDEX, TROPONINI in the last 168 hours.  BNP: Invalid input(s): POCBNP  CBG: Recent Labs  Lab 05/16/21 0731 05/16/21 1342 05/16/21 1626 05/16/21 2200 05/17/21 0744  GLUCAP 107* 138* 213* 128* 153*     Microbiology: Results for orders placed or performed during the hospital encounter of 05/14/21  Resp Panel by RT-PCR (Flu A&B, Covid) Nasopharyngeal Swab     Status: None   Collection Time: 05/14/21  4:03 PM   Specimen: Nasopharyngeal Swab; Nasopharyngeal(NP) swabs in vial transport medium  Result Value Ref Range Status   SARS Coronavirus 2 by RT PCR NEGATIVE NEGATIVE Final    Comment: (  NOTE) SARS-CoV-2 target nucleic acids are NOT DETECTED.  The SARS-CoV-2 RNA is generally detectable in upper respiratory specimens during the acute phase of infection. The lowest concentration of SARS-CoV-2 viral copies this assay can detect is 138 copies/mL. A negative result does not preclude SARS-Cov-2 infection and should not be used as the sole basis for treatment or other patient management decisions. A negative result may occur with  improper specimen collection/handling, submission of specimen other than nasopharyngeal swab, presence of viral mutation(s) within the areas targeted by this assay, and inadequate number of viral copies(<138  copies/mL). A negative result must be combined with clinical observations, patient history, and epidemiological information. The expected result is Negative.  Fact Sheet for Patients:  EntrepreneurPulse.com.au  Fact Sheet for Healthcare Providers:  IncredibleEmployment.be  This test is no t yet approved or cleared by the [place] FDA and  has been authorized for detection and/or diagnosis of SARS-CoV-2 by FDA under an Emergency Use Authorization (EUA). This EUA will remain  in effect (meaning this test can be used) for the duration of the COVID-19 declaration under Section 564(b)(1) of the Act, 21 U.S.C.section 360bbb-3(b)(1), unless the authorization is terminated  or revoked sooner.       Influenza A by PCR NEGATIVE NEGATIVE Final   Influenza B by PCR NEGATIVE NEGATIVE Final    Comment: (NOTE) The Xpert Xpress SARS-CoV-2/FLU/RSV plus assay is intended as an aid in the diagnosis of influenza from Nasopharyngeal swab specimens and should not be used as a sole basis for treatment. Nasal washings and aspirates are unacceptable for Xpert Xpress SARS-CoV-2/FLU/RSV testing.  Fact Sheet for Patients: EntrepreneurPulse.com.au  Fact Sheet for Healthcare Providers: IncredibleEmployment.be  This test is not yet approved or cleared by the [place] FDA and has been authorized for detection and/or diagnosis of SARS-CoV-2 by FDA under an Emergency Use Authorization (EUA). This EUA will remain in effect (meaning this test can be used) for the duration of the COVID-19 declaration under Section 564(b)(1) of the Act, 21 U.S.C. section 360bbb-3(b)(1), unless the authorization is terminated or revoked.  Performed at [facility] [address], [place] [place], [place] [address]     Coagulation Studies: No results for input(s): LABPROT, INR in the last 72 hours.  Urinalysis: No results for input(s):  COLORURINE, LABSPEC, PHURINE, GLUCOSEU, HGBUR, BILIRUBINUR, KETONESUR, PROTEINUR, UROBILINOGEN, NITRITE, LEUKOCYTESUR in the last 72 hours.  Invalid input(s): APPERANCEUR    Imaging: No results found.   Medications:    dialysis solution 1.5% low-MG/low-CA     dialysis solution 2.5% low-MG/low-CA       (feeding supplement) PROSource Plus  30 mL Oral BID BM   amLODipine  5 mg Oral Daily   feeding supplement  1 Container Oral TID BM   furosemide  120 mg Oral Daily   gentamicin cream  1 application Topical Daily   insulin aspart  0-6 Units Subcutaneous TID WC   levothyroxine  100 mcg Oral Q0600   magnesium oxide  400 mg Oral Daily   metoprolol tartrate  25 mg Oral BID   multivitamin  1 tablet Oral Daily   pantoprazole (PROTONIX) IV  40 mg Intravenous Q12H   rosuvastatin  10 mg Oral Daily   sevelamer carbonate  800 mg Oral BID WC   tamsulosin  0.4 mg Oral Daily   vitamin B-12  5,000 mcg Oral Daily   acetaminophen **OR** acetaminophen, albuterol, heparin, ondansetron **OR** ondansetron (ZOFRAN) IV  Assessment/ Plan:  Manuel Klein is a 80  y.o.  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 He presents to the ED with increased weakness and fatigue.   [facility] 4 cycles, 2L fills, no last fill  End Stage Renal Disease on peritoneal dialysis: Last peritoneal dialysis 05/13/21.  PD catheter intact Will maintain outpatient schedule while admitted Received full PD treatment last night. Tolerated well. UF 167ml recorded   2. Anemia of chronic kidney disease microcytic.  Lab Results  Component Value Date   HGB 9.8 (L) 05/17/2021   Hemoccult stool positive on admission Received transfusions during this admission EDG completed by Gi-no active bleeding, biopsies taken of enlarged gastric folds. Small bowel capsule study tomorrow. EPO benefits outweigh  risk and will be restarted weekly  3. Secondary Hyperparathyroidism: with outpatient labs: calcium 7.9 on 11/15/20.   Lab Results  Component Value Date   CALCIUM 7.8 (L) 05/17/2021  Renvela with meals Phosphorus 4.6  4. Hypertension Home regimen includes amlodipine, hydralazine,losartan, metoprolol and furosemide. Currently receiving amlodipine and metoprolol. Elevated. Will continue to monitor        LOS: 2 Rylyn Zawistowski 6/30/202211:32 AM

## 2021-05-18 ENCOUNTER — Encounter
Admission: EM | Disposition: A | Discharge status: Home Health | Payer: Self-pay | Source: Home / Self Care | Attending: Internal Medicine

## 2021-05-18 DIAGNOSIS — D509 Iron deficiency anemia, unspecified: Secondary | ICD-10-CM

## 2021-05-18 HISTORY — PX: GIVENS CAPSULE STUDY: SHX5432

## 2021-05-18 LAB — BASIC METABOLIC PANEL
Anion gap: 9 (ref 5–15)
BUN: 63 mg/dL — ABNORMAL HIGH (ref 8–23)
CO2: 26 mmol/L (ref 22–32)
Calcium: 7.8 mg/dL — ABNORMAL LOW (ref 8.9–10.3)
Chloride: 100 mmol/L (ref 98–111)
Creatinine, Ser: 6.05 mg/dL — ABNORMAL HIGH (ref 0.61–1.24)
GFR, Estimated: 9 mL/min — ABNORMAL LOW (ref >60)
Glucose, Bld: 81 mg/dL (ref 70–99)
Potassium: 3.4 mmol/L — ABNORMAL LOW (ref 3.5–5.1)
Sodium: 135 mmol/L (ref 135–145)

## 2021-05-18 LAB — CBC WITH DIFFERENTIAL/PLATELET
Abs Immature Granulocytes: 0.04 10*3/uL (ref 0.00–0.07)
Basophils Absolute: 0.1 10*3/uL (ref 0.0–0.1)
Basophils Relative: 1 %
Eosinophils Absolute: 1.2 10*3/uL — ABNORMAL HIGH (ref 0.0–0.5)
Eosinophils Relative: 15 %
HCT: 29.2 % — ABNORMAL LOW (ref 39.0–52.0)
Hemoglobin: 10.2 g/dL — ABNORMAL LOW (ref 13.0–17.0)
Immature Granulocytes: 1 %
Lymphocytes Relative: 11 %
Lymphs Abs: 0.9 10*3/uL (ref 0.7–4.0)
MCH: 32.2 pg (ref 26.0–34.0)
MCHC: 34.9 g/dL (ref 30.0–36.0)
MCV: 92.1 fL (ref 80.0–100.0)
Monocytes Absolute: 1.2 10*3/uL — ABNORMAL HIGH (ref 0.1–1.0)
Monocytes Relative: 15 %
Neutro Abs: 4.8 10*3/uL (ref 1.7–7.7)
Neutrophils Relative %: 57 %
Platelets: 213 10*3/uL (ref 150–400)
RBC: 3.17 MIL/uL — ABNORMAL LOW (ref 4.22–5.81)
RDW: 13.3 % (ref 11.5–15.5)
WBC: 8.2 10*3/uL (ref 4.0–10.5)
nRBC: 0 % (ref 0.0–0.2)

## 2021-05-18 LAB — GLUCOSE, CAPILLARY
Glucose-Capillary: 91 mg/dL (ref 70–99)
Glucose-Capillary: 108 mg/dL — ABNORMAL HIGH (ref 70–99)
Glucose-Capillary: 108 mg/dL — ABNORMAL HIGH (ref 70–99)
Glucose-Capillary: 322 mg/dL — ABNORMAL HIGH (ref 70–99)

## 2021-05-18 SURGERY — IMAGING PROCEDURE, GI TRACT, INTRALUMINAL, VIA CAPSULE

## 2021-05-18 MED ORDER — LOSARTAN POTASSIUM 50 MG PO TABS
100.0000 mg | ORAL_TABLET | Freq: Every day | ORAL | Status: DC
  Administered 2021-05-18 – 2021-05-19 (×2): 100 mg via ORAL
  Filled 2021-05-18 (×2): qty 2

## 2021-05-18 NOTE — Care Management Important Message (Signed)
Important Message  Patient Details  Name: Manuel Klein MRN: [id number] Date of Birth: 1941-09-08   Medicare Important Message Given:  Yes     Dannette Barbara 05/18/2021, 3:28 PM

## 2021-05-18 NOTE — Progress Notes (Signed)
[facility]  ROUNDING NOTE   Subjective:   Manuel Klein is a 80 y.o. 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 He presents to the ED with increased weakness and fatigue.   Patient is followed by our clinic for peritoneal dialysis. We have been consulted to manage this treatment during admission.  Patient seen laying in bed Wife at bedside Capsule study began this morning Denies discomfort Denies shortness of breath Tolerating meals  Objective:  Vital signs in last 24 hours:  Temp:  [97.8 F (36.6 C)-98.4 F (36.9 C)] 97.8 F (36.6 C) (07/01 0853) Pulse Rate:  [61-66] 61 (07/01 0853) Resp:  [19-20] 19 (07/01 0853) BP: (152-166)/(62-69) 166/63 (07/01 0853) SpO2:  [94 %-96 %] 96 % (07/01 0853)  Weight change:  Filed Weights   05/14/21 1244 05/17/21 0500  Weight: 67 kg 70.8 kg    Intake/Output: I/O last 3 completed shifts: In: 9675 [P.O.:600; Other:8000] Out: 9163 [Urine:1100; Other:8111]   Intake/Output this shift:  No intake/output data recorded.  Physical Exam: General: NAD, resting in bed  Head: Normocephalic, atraumatic. Moist oral mucosal membranes  Eyes: Anicteric  Lungs:  Clear to auscultation, normal breathing effort, room air  Heart: Regular rate and rhythm  Abdomen:  Soft, nontender  Extremities: No peripheral edema   Neurologic: Nonfocal, moving all four extremities  Skin: No lesions  Access: PD catheter    Basic Metabolic Panel: Recent Labs  Lab 05/14/21 1253 05/15/21 0445 05/16/21 0804 05/17/21 0515 05/18/21 0621  NA 135 136 137 134* 135  K 3.1* 3.7 3.8 3.5 3.4*  CL 97* 101 99 100 100  CO2 28 28 26 27 26   GLUCOSE 177* 122* 113* 197* 81  BUN 68* 69* 65* 60* 63*  CREATININE 5.81* 6.12* 6.15* 5.82* 6.05*  CALCIUM 7.9* 7.7* 8.3* 7.8* 7.8*  PHOS  --   --   --  4.6  --      Liver Function  Tests: Recent Labs  Lab 05/11/21 1507 05/14/21 1253  AST 17 16  ALT 21 17  ALKPHOS 57 58  BILITOT 0.3 0.4  PROT 6.5 6.1*  ALBUMIN 2.4* 2.4*    No results for input(s): LIPASE, AMYLASE in the last 168 hours. No results for input(s): AMMONIA in the last 168 hours.  CBC: Recent Labs  Lab 05/11/21 1507 05/14/21 1253 05/15/21 0445 05/15/21 1728 05/17/21 0515 05/18/21 0621  WBC 10.7* 10.2 8.8  --  8.0 8.2  NEUTROABS 6.9  --   --   --  5.0 4.8  HGB 6.3* 5.5* 6.8* 10.6* 9.8* 10.2*  HCT 19.3* 16.4* 20.1* 31.0* 28.3* 29.2*  MCV 99.0 99.4 94.8  --  93.1 92.1  PLT 273 251 203  --  206 213     Cardiac Enzymes: No results for input(s): CKTOTAL, CKMB, CKMBINDEX, TROPONINI in the last 168 hours.  BNP: Invalid input(s): POCBNP  CBG: Recent Labs  Lab 05/17/21 1133 05/17/21 1639 05/17/21 2059 05/18/21 0735 05/18/21 1130  GLUCAP 168* 143* 297* 91 108*     Microbiology: Results for orders placed or performed during the hospital encounter of 05/14/21  Resp Panel by RT-PCR (Flu A&B, Covid) Nasopharyngeal Swab     Status: None   Collection Time: 05/14/21  4:03 PM   Specimen: Nasopharyngeal Swab; Nasopharyngeal(NP) swabs in vial transport medium  Result Value Ref Range Status   SARS Coronavirus 2 by RT PCR  NEGATIVE NEGATIVE Final    Comment: (NOTE) SARS-CoV-2 target nucleic acids are NOT DETECTED.  The SARS-CoV-2 RNA is generally detectable in upper respiratory specimens during the acute phase of infection. The lowest concentration of SARS-CoV-2 viral copies this assay can detect is 138 copies/mL. A negative result does not preclude SARS-Cov-2 infection and should not be used as the sole basis for treatment or other patient management decisions. A negative result may occur with  improper specimen collection/handling, submission of specimen other than nasopharyngeal swab, presence of viral mutation(s) within the areas targeted by this assay, and inadequate number of  viral copies(<138 copies/mL). A negative result must be combined with clinical observations, patient history, and epidemiological information. The expected result is Negative.  Fact Sheet for Patients:  EntrepreneurPulse.com.au  Fact Sheet for Healthcare Providers:  IncredibleEmployment.be  This test is no t yet approved or cleared by the [place] FDA and  has been authorized for detection and/or diagnosis of SARS-CoV-2 by FDA under an Emergency Use Authorization (EUA). This EUA will remain  in effect (meaning this test can be used) for the duration of the COVID-19 declaration under Section 564(b)(1) of the Act, 21 U.S.C.section 360bbb-3(b)(1), unless the authorization is terminated  or revoked sooner.       Influenza A by PCR NEGATIVE NEGATIVE Final   Influenza B by PCR NEGATIVE NEGATIVE Final    Comment: (NOTE) The Xpert Xpress SARS-CoV-2/FLU/RSV plus assay is intended as an aid in the diagnosis of influenza from Nasopharyngeal swab specimens and should not be used as a sole basis for treatment. Nasal washings and aspirates are unacceptable for Xpert Xpress SARS-CoV-2/FLU/RSV testing.  Fact Sheet for Patients: EntrepreneurPulse.com.au  Fact Sheet for Healthcare Providers: IncredibleEmployment.be  This test is not yet approved or cleared by the [place] FDA and has been authorized for detection and/or diagnosis of SARS-CoV-2 by FDA under an Emergency Use Authorization (EUA). This EUA will remain in effect (meaning this test can be used) for the duration of the COVID-19 declaration under Section 564(b)(1) of the Act, 21 U.S.C. section 360bbb-3(b)(1), unless the authorization is terminated or revoked.  Performed at [facility], [address] [place], [place] [address]     Coagulation Studies: No results for input(s): LABPROT, INR in the last 72 hours.  Urinalysis: No  results for input(s): COLORURINE, LABSPEC, PHURINE, GLUCOSEU, HGBUR, BILIRUBINUR, KETONESUR, PROTEINUR, UROBILINOGEN, NITRITE, LEUKOCYTESUR in the last 72 hours.  Invalid input(s): APPERANCEUR    Imaging: No results found.   Medications:    dialysis solution 1.5% low-MG/low-CA       (feeding supplement) PROSource Plus  30 mL Oral BID BM   amLODipine  5 mg Oral Daily   epoetin (EPOGEN/PROCRIT) injection  10,000 Units Subcutaneous Weekly   feeding supplement  1 Container Oral TID BM   furosemide  120 mg Oral Daily   gentamicin cream  1 application Topical Daily   insulin aspart  0-6 Units Subcutaneous TID AC & HS   levothyroxine  100 mcg Oral Q0600   magnesium oxide  400 mg Oral Daily   metoprolol tartrate  25 mg Oral BID   multivitamin  1 tablet Oral Daily   pantoprazole (PROTONIX) IV  40 mg Intravenous Q12H   rosuvastatin  10 mg Oral Daily   sevelamer carbonate  800 mg Oral BID WC   tamsulosin  0.4 mg Oral Daily   vitamin B-12  5,000 mcg Oral Daily   acetaminophen **OR** acetaminophen, albuterol, heparin, ondansetron **OR** ondansetron (ZOFRAN) IV  Assessment/ Plan:  Manuel Klein is a 80 y.o.  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 He presents to the ED with increased weakness and fatigue.   [facility] 4 cycles, 2L fills, no last fill  End Stage Renal Disease on peritoneal dialysis: Last peritoneal dialysis 05/13/21.  PD catheter intact Will maintain outpatient schedule while admitted Received full PD treatment last night. Tolerated well.  UF appropriate   2. Anemia of chronic kidney disease microcytic.  Lab Results  Component Value Date   HGB 10.2 (L) 05/18/2021   Hemoccult stool positive on admission Received transfusions during this admission EDG completed by Gi-no active bleeding, biopsies taken of enlarged gastric folds.  Small bowel capsule study today EPO weekly  3. Secondary Hyperparathyroidism: with outpatient labs: calcium 7.9 on 11/15/20.   Lab Results  Component Value Date   CALCIUM 7.8 (L) 05/18/2021   PHOS 4.6 05/17/2021  Renvela with meals Phosphorus 4.6  4. Hypertension Home regimen includes amlodipine, hydralazine,losartan, metoprolol and furosemide. Currently receiving amlodipine and metoprolol. Elevated. Will continue to monitor        LOS: 3 Michelene Keniston 7/1/202211:57 AM

## 2021-05-18 NOTE — Progress Notes (Signed)
PROGRESS NOTE  Manuel Klein  DOB: 04-May-1941  PCP: Lynnell Jude, MD NKN:[id number]  DOA: 05/14/2021  LOS: 3 days  Hospital Day: 5   Chief Complaint  Patient presents with   Anemia    Brief narrative: Manuel Klein is a 80 y.o. male with PMH significant for ESRD on peritoneal dialysis, diabetes mellitus, CAD status post CABG, valve replacement, hypertension, renal cell cancer status post collaboration, CHF. Patient presented to the ED on 6/27 with generalized weakness, dizziness, lightheadedness. Few days prior, he was noted to have a low hemoglobin of 6.3 and scheduled for outpatient transfusion but because of worsening symptoms, patient was brought to the ED. In the ED, hemoglobin was low at 5.5, 2 unit of PRBC transfused. Admitted to hospitalist service.  GI consultation obtained. 6/20, patient underwent EGD.  Subjective: Patient was seen and examined this morning. Lying on bed.  Not in distress.  Capsule placed today. Blood pressure running elevated in 160s this morning.  Assessment/Plan: Severe symptomatic anemia -Probably related to ESRD, suspected GI bleeding as well based on melanotic stool. -Hemoglobin at 5.5 on presentation.  2 units of PRBCs transfused.  Hemoglobin improved to 10.2 on repeat this morning. Recent Labs    05/11/21 1507 05/14/21 1253 05/15/21 0445 05/15/21 1728 05/17/21 0515 05/18/21 0621  HGB 6.3* 5.5* 6.8* 10.6* 9.8* 10.2*  MCV 99.0 99.4 94.8  --  93.1 92.1  FERRITIN 816*  --   --   --   --   --   TIBC 244* 258  --   --   --   --   IRON 38* 27*  --   --   --   --   RETICCTPCT 2.5  --   --   --   --   --   Suspect GI bleeding -EGD 6/29 with erythematous gastric folds.  No ulcer. -Capsule placed today.  GI to follow  Chronic diastolic CHF Essential hypertension -Home meds include metoprolol 25 mg daily, Lasix 120 mg daily, losartan 100 mg daily, amlodipine 5 mg daily, hydralazine 25 mg twice daily -Currently on metoprolol and oral  Lasix. -Losartan, amlodipine and hydralazine remain on hold -Blood pressures running elevated to 150s and 160s consistently.  Resume losartan today.  Type 2 diabetes mellitus -A1c 6.4 on 6/27 Recent Labs  Lab 05/17/21 1133 05/17/21 1639 05/17/21 2059 05/18/21 0735 05/18/21 1130  GLUCAP 168* 143* 297* 91 108*   ESRD on PD -Nephrology following.  Coronary artery disease status post CABG -Continue Lopressor, statin.  Aspirin on hold because of bleeding.    Mobility: Encourage ambulation.  PT eval obtained.  Home health PT recommended. Code Status:   Code Status: Full Code  Nutritional status: Body mass index is 23.72 kg/m. Nutrition Problem: Severe Malnutrition Etiology: chronic illness, cancer and cancer related treatments (ESRD on PD) Signs/Symptoms: energy intake < or equal to 75% for > or equal to 1 month, moderate fat depletion, moderate muscle depletion, severe muscle depletion Diet Order             DIET SOFT Room service appropriate? Yes; Fluid consistency: Thin  Diet effective now                   DVT prophylaxis:  SCDs Start: 05/14/21 1553   Antimicrobials: None Fluid: None Consultants: GI Family Communication: Wife at bedside  Status is: Inpatient  Remains inpatient appropriate because: Capsule endoscopy started today  Dispo: The patient is from: Home  Anticipated d/c is to: Home in 1 to 2 days              Patient currently is not medically stable to d/c.   Difficult to place patient No     Infusions:   dialysis solution 1.5% low-MG/low-CA      Scheduled Meds:  (feeding supplement) PROSource Plus  30 mL Oral BID BM   amLODipine  5 mg Oral Daily   epoetin (EPOGEN/PROCRIT) injection  10,000 Units Subcutaneous Weekly   feeding supplement  1 Container Oral TID BM   furosemide  120 mg Oral Daily   gentamicin cream  1 application Topical Daily   insulin aspart  0-6 Units Subcutaneous TID AC & HS   levothyroxine  100 mcg Oral  Q0600   losartan  100 mg Oral Daily   magnesium oxide  400 mg Oral Daily   metoprolol tartrate  25 mg Oral BID   multivitamin  1 tablet Oral Daily   pantoprazole (PROTONIX) IV  40 mg Intravenous Q12H   rosuvastatin  10 mg Oral Daily   sevelamer carbonate  800 mg Oral BID WC   tamsulosin  0.4 mg Oral Daily   vitamin B-12  5,000 mcg Oral Daily    Antimicrobials: Anti-infectives (From admission, onward)    None       PRN meds: acetaminophen **OR** acetaminophen, albuterol, heparin, ondansetron **OR** ondansetron (ZOFRAN) IV   Objective: Vitals:   05/18/21 0438 05/18/21 0853  BP: (!) 152/62 (!) 166/63  Pulse: 61 61  Resp: 20 19  Temp: 98.4 F (36.9 C) 97.8 F (36.6 C)  SpO2: 94% 96%    Intake/Output Summary (Last 24 hours) at 05/18/2021 1215 Last data filed at 05/18/2021 1053 Gross per 24 hour  Intake 360 ml  Output 1100 ml  Net -740 ml   Filed Weights   05/14/21 1244 05/17/21 0500  Weight: 67 kg 70.8 kg   Weight change:  Body mass index is 23.72 kg/m.   Physical Exam: General exam: Pleasant, elderly Caucasian male.  Not in distress Skin: No rashes, lesions or ulcers. HEENT: Atraumatic, normocephalic, no obvious bleeding Lungs: Clear to auscultation bilaterally.  Not on supplemental oxygen CVS: Regular rate and rhythm, no murmur GI/Abd soft, nontender, nondistended, bowel sound present CNS: Alert, awake and oriented x3 Psychiatry: Mood appropriate Extremities: No edema, no calf tenderness  Data Review: I have personally reviewed the laboratory data and studies available.  Recent Labs  Lab 05/11/21 1507 05/14/21 1253 05/15/21 0445 05/15/21 1728 05/17/21 0515 05/18/21 0621  WBC 10.7* 10.2 8.8  --  8.0 8.2  NEUTROABS 6.9  --   --   --  5.0 4.8  HGB 6.3* 5.5* 6.8* 10.6* 9.8* 10.2*  HCT 19.3* 16.4* 20.1* 31.0* 28.3* 29.2*  MCV 99.0 99.4 94.8  --  93.1 92.1  PLT 273 251 203  --  206 213   Recent Labs  Lab 05/14/21 1253 05/15/21 0445 05/16/21 0804  05/17/21 0515 05/18/21 0621  NA 135 136 137 134* 135  K 3.1* 3.7 3.8 3.5 3.4*  CL 97* 101 99 100 100  CO2 28 28 26 27 26   GLUCOSE 177* 122* 113* 197* 81  BUN 68* 69* 65* 60* 63*  CREATININE 5.81* 6.12* 6.15* 5.82* 6.05*  CALCIUM 7.9* 7.7* 8.3* 7.8* 7.8*  PHOS  --   --   --  4.6  --     F/u labs ordered Unresulted Labs (From admission, onward)     Start  Ordered   05/17/21 0500  CBC with Differential/Platelet  Daily,   R      05/16/21 1248   05/17/21 0164  Basic metabolic panel  Daily,   R      05/16/21 1248            Signed, Terrilee Croak, MD [facility] 05/18/2021

## 2021-05-18 NOTE — Progress Notes (Signed)
Mobility Specialist - Progress Note   05/18/21 1500  Mobility  Activity Refused mobility  Mobility performed by Mobility specialist    Pt sleeping in bed upon arrival, family at bedside. Pt did not awake to voice and family politely requested to let pt rest at this time. Will attempt session at another date as appropriate.    Kathee Delton Mobility Specialist 05/18/21, 3:57 PM

## 2021-05-18 NOTE — Progress Notes (Signed)
Pt PD connected for treatment.

## 2021-05-18 NOTE — Progress Notes (Addendum)
Pt connected to PD for treatment. Pt alert, education provided to patient and wife. No complications with start of tx. See CCPD flowsheet for tx information. 8L and 10 hours w/ 1.5% as ordered on MAR. Primary RN aware.

## 2021-05-19 LAB — CBC WITH DIFFERENTIAL/PLATELET
Abs Immature Granulocytes: 0.03 10*3/uL (ref 0.00–0.07)
Basophils Absolute: 0.1 10*3/uL (ref 0.0–0.1)
Basophils Relative: 1 %
Eosinophils Absolute: 1.4 10*3/uL — ABNORMAL HIGH (ref 0.0–0.5)
Eosinophils Relative: 18 %
HCT: 30.8 % — ABNORMAL LOW (ref 39.0–52.0)
Hemoglobin: 10.4 g/dL — ABNORMAL LOW (ref 13.0–17.0)
Immature Granulocytes: 0 %
Lymphocytes Relative: 9 %
Lymphs Abs: 0.7 10*3/uL (ref 0.7–4.0)
MCH: 32.4 pg (ref 26.0–34.0)
MCHC: 33.8 g/dL (ref 30.0–36.0)
MCV: 96 fL (ref 80.0–100.0)
Monocytes Absolute: 1 10*3/uL (ref 0.1–1.0)
Monocytes Relative: 14 %
Neutro Abs: 4.3 10*3/uL (ref 1.7–7.7)
Neutrophils Relative %: 58 %
Platelets: 204 10*3/uL (ref 150–400)
RBC: 3.21 MIL/uL — ABNORMAL LOW (ref 4.22–5.81)
RDW: 13.2 % (ref 11.5–15.5)
WBC: 7.5 10*3/uL (ref 4.0–10.5)
nRBC: 0 % (ref 0.0–0.2)

## 2021-05-19 LAB — GLUCOSE, CAPILLARY
Glucose-Capillary: 104 mg/dL — ABNORMAL HIGH (ref 70–99)
Glucose-Capillary: 200 mg/dL — ABNORMAL HIGH (ref 70–99)

## 2021-05-19 LAB — BASIC METABOLIC PANEL
Anion gap: 7 (ref 5–15)
BUN: 55 mg/dL — ABNORMAL HIGH (ref 8–23)
CO2: 29 mmol/L (ref 22–32)
Calcium: 7.7 mg/dL — ABNORMAL LOW (ref 8.9–10.3)
Chloride: 100 mmol/L (ref 98–111)
Creatinine, Ser: 5.88 mg/dL — ABNORMAL HIGH (ref 0.61–1.24)
GFR, Estimated: 9 mL/min — ABNORMAL LOW (ref >60)
Glucose, Bld: 135 mg/dL — ABNORMAL HIGH (ref 70–99)
Potassium: 3.7 mmol/L (ref 3.5–5.1)
Sodium: 136 mmol/L (ref 135–145)

## 2021-05-19 MED ORDER — PANTOPRAZOLE SODIUM 40 MG PO TBEC: 40.0000 mg | DELAYED_RELEASE_TABLET | Freq: Two times a day (BID) | ORAL | 0 refills | Status: AC

## 2021-05-19 NOTE — Progress Notes (Signed)
Pt was noted with no complaints at bedside, machine at "end of therapy" Total Uf was 116ml , average dwell 1hr 37 mins lost dwell was 87mins. Exit site care was done this morning by this RN. Site was noted with a small are  redness, no drainage. Site was disinfected aseptically using chlorhexadine scrubs and after drying, gentamicin cream and dry sterile dressing was applied reinforced with a  Tegaderm.

## 2021-05-19 NOTE — Discharge Summary (Signed)
Physician Discharge Summary  YUTA CIPOLLONE OZH:[id number] DOB: 02-11-1941 DOA: 05/14/2021  PCP: Lynnell Jude, MD  Admit date: 05/14/2021 Discharge date: 05/20/2021  Admitted From: Home Discharge disposition: Home with home health PT   Code Status: Prior  Diet Recommendation: Cardiac diet  Discharge Diagnosis:   Principal Problem:   Absolute anemia Active Problems:   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[place])   Essential hypertension   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[place])   Coronary artery disease   Protein-calorie malnutrition, severe   Hypokalemia   ESRD (end stage renal disease) ([place])   Chronic diastolic CHF (congestive heart failure) [facility])    Chief Complaint  Patient presents with   Anemia    Brief narrative: Manuel Klein is a 80 y.o. male with PMH significant for ESRD on peritoneal dialysis, diabetes mellitus, CAD status post CABG, valve replacement, hypertension, renal cell cancer status post collaboration, CHF. Patient presented to the ED on 6/27 with generalized weakness, dizziness, lightheadedness. Few days prior, he was noted to have a low hemoglobin of 6.3 and scheduled for outpatient transfusion but because of worsening symptoms, patient was brought to the ED. In the ED, hemoglobin was low at 5.5, 2 unit of PRBC transfused. Admitted to hospitalist service.  GI consultation obtained. 6/20, patient underwent EGD.  Subjective: Patient was seen and examined this morning. Lying on bed.  Not in distress.  Capsule placed today. Blood pressure running elevated in 160s this morning.  Hospital course: Severe symptomatic anemia -Probably related to ESRD, suspected GI bleeding as well based on melanotic stool. -Hemoglobin at 5.5 on presentation.  2 units of PRBCs transfused.  Hemoglobin improving and is stable, 10.4 today.   Recent Labs    05/11/21 1507 05/14/21 1253 05/15/21 0445 05/15/21 1728 05/17/21 0515  05/18/21 0621 05/19/21 0507  HGB 6.3* 5.5* 6.8* 10.6* 9.8* 10.2* 10.4*  MCV 99.0 99.4 94.8  --  93.1 92.1 96.0  FERRITIN 816*  --   --   --   --   --   --   TIBC 244* 258  --   --   --   --   --   IRON 38* 27*  --   --   --   --   --   RETICCTPCT 2.5  --   --   --   --   --   --    GI bleeding -EGD 6/29 with erythematous gastric folds.  No ulcer. -Capsule study done.  Unremarkable per GI.  Okay to discharge.  To continue PPI as an outpatient.  Follow-up with GI at [facility].  Chronic diastolic CHF Essential hypertension -Home meds include metoprolol 25 mg daily, Lasix 120 mg daily, losartan 100 mg daily, amlodipine 5 mg daily, hydralazine 25 mg twice daily -Meds were initially held because of low blood pressure.  Gradually resumed.  He will be discharged on the same regimen.  Type 2 diabetes mellitus -A1c 6.4 on 6/27 Recent Labs  Lab 05/18/21 1130 05/18/21 1625 05/18/21 2052 05/19/21 0803 05/19/21 1157  GLUCAP 108* 108* 322* 104* 200*  ESRD on PD -Nephrology following.  Coronary artery disease status post CABG -Continue Lopressor, statin.  Aspirin on hold because of bleeding.     Wound care: Incision - 3 Ports Abdomen Mid;Upper Mid;Lower Mid;Left (Active)  Placement Date/Time: 11/07/17 0820   Location of Ports: Abdomen  Location Orientation: Mid;Upper  Location Orientation: Mid;Lower  Location Orientation: Mid;Left  Assessments 11/07/2017  8:20 AM 11/07/2017  9:32 AM  Port 1 Dressing Type Liquid skin adhesive Liquid skin adhesive  Port 2 Dressing Type Liquid skin adhesive Liquid skin adhesive  Port 3 Dressing Type Gauze (Comment);Paper tape Gauze (Comment);Other (Comment)     No Linked orders to display    Discharge Exam:   Vitals:   05/18/21 1955 05/19/21 0338 05/19/21 0500 05/19/21 0802  BP: (!) 161/62 (!) 156/69  (!) 172/68  Pulse: 65 62  65  Resp: 16 16  18   Temp: 98.4 F (36.9 C) 98.7 F (37.1 C)  97.9 F (36.6 C)  TempSrc: Oral Oral  Oral  SpO2: 93% 94%   97%  Weight:   64.3 kg   Height:        Body mass index is 21.55 kg/m.  General exam: Pleasant elderly Caucasian male.  Lying on bed.  Not in distress.  No new symptoms Skin: No rashes, lesions or ulcers. HEENT: Atraumatic, normocephalic, no obvious bleeding Lungs: Clear to auscultation bilaterally CVS: Regular rate and rhythm, no murmur GI/Abd soft, nontender, nondistended, bowel sound present CNS: Alert, awake, oriented x3 Psychiatry: Mood appropriate Extremities: No pedal edema, no calf tenderness  Follow ups:   Discharge Instructions     Diet - low sodium heart healthy   Complete by: As directed    Diet Carb Modified   Complete by: As directed    Increase activity slowly   Complete by: As directed    No wound care   Complete by: As directed        Follow-up Information     Lynnell Jude, MD Follow up.   Specialty: Family Medicine Contact information: [address] [place] [address] [phone]                 Recommendations for Outpatient Follow-Up:   Follow-up with PCP as an outpatient Follow-up with GI as an outpatient  Discharge Instructions:  Follow with Primary MD Lynnell Jude, MD in 7 days   Get CBC/BMP checked in next visit within 1 week by PCP or SNF MD ( we routinely change or add medications that can affect your baseline labs and fluid status, therefore we recommend that you get the mentioned basic workup next visit with your PCP, your PCP may decide not to get them or add new tests based on their clinical decision)  On your next visit with your PCP, please Get Medicines reviewed and adjusted.  Please request your PCP  to go over all Hospital Tests and Procedure/Radiological results at the follow up, please get all Hospital records sent to your Prim MD by signing hospital release before you go home.  Activity: As tolerated with Full fall precautions use walker/cane & assistance as needed  For Heart failure patients - Check your  Weight same time everyday, if you gain over 2 pounds, or you develop in leg swelling, experience more shortness of breath or chest pain, call your Primary MD immediately. Follow Cardiac Low Salt Diet and 1.5 lit/day fluid restriction.  If you have smoked or chewed Tobacco in the last 2 yrs please stop smoking, stop any regular Alcohol  and or any Recreational drug use.  If you experience worsening of your admission symptoms, develop shortness of breath, life threatening emergency, suicidal or homicidal thoughts you must seek medical attention immediately by calling 911 or calling your MD immediately  if symptoms less severe.  You Must read complete instructions/literature along with all the possible  adverse reactions/side effects for all the Medicines you take and that have been prescribed to you. Take any new Medicines after you have completely understood and accpet all the possible adverse reactions/side effects.   Do not drive, operate heavy machinery, perform activities at heights, swimming or participation in water activities or provide baby sitting services if your were admitted for syncope or siezures until you have seen by Primary MD or a Neurologist and advised to do so again.  Do not drive when taking Pain medications.  Do not take more than prescribed Pain, Sleep and Anxiety Medications  Wear Seat belts while driving.   Please note You were cared for by a hospitalist during your hospital stay. If you have any questions about your discharge medications or the care you received while you were in the hospital after you are discharged, you can call the unit and asked to speak with the hospitalist on call if the hospitalist that took care of you is not available. Once you are discharged, your primary care physician will handle any further medical issues. Please note that NO REFILLS for any discharge medications will be authorized once you are discharged, as it is imperative that you return to  your primary care physician (or establish a relationship with a primary care physician if you do not have one) for your aftercare needs so that they can reassess your need for medications and monitor your lab values.    Time coordinating discharge: 35 minutes  Allergies as of 05/19/2021       Reactions   Hydralazine    Other reaction(s): Dizziness, Muscle Pain Chest pain   Atorvastatin    Other reaction(s): Muscle Pain   Carvedilol Other (See Comments)   Dizziness, drunk feeling   Ciprofloxacin Rash   Other Rash   Penicillins Rash, Other (See Comments)   Has patient had a PCN reaction causing immediate rash, facial/tongue/throat swelling, SOB or lightheadedness with hypotension: No Has patient had a PCN reaction causing severe rash involving mucus membranes or skin necrosis: No Has patient had a PCN reaction that required hospitalization: No Has patient had a PCN reaction occurring within the last 10 years: No If all of the above answers are "NO", then may proceed with Cephalosporin use.   Sulfa Antibiotics Rash   Sulfasalazine Rash        Medication List     TAKE these medications    amLODipine 5 MG tablet Commonly known as: NORVASC Take 5 mg by mouth daily.   aspirin 81 MG tablet Take 81 mg by mouth daily.   Cyanocobalamin 5000 MCG Caps Take 5,000 Units by mouth daily.   furosemide 40 MG tablet Commonly known as: LASIX Take 120 mg by mouth daily.   hydrALAZINE 25 MG tablet Commonly known as: APRESOLINE Take 25 mg by mouth 2 (two) times daily.   insulin lispro 100 UNIT/ML KiwkPen Commonly known as: HUMALOG Inject 6-10 Units into the skin 3 (three) times daily with meals.   levothyroxine 100 MCG tablet Commonly known as: SYNTHROID Take 100 mcg by mouth daily.   losartan 100 MG tablet Commonly known as: COZAAR Take 100 mg by mouth daily.   magnesium oxide 400 MG tablet Commonly known as: MAG-OX Take 400 mg by mouth daily.   metoprolol tartrate 25 MG  tablet Commonly known as: LOPRESSOR Take 25 mg by mouth 2 (two) times daily.   multivitamin Tabs tablet Take 1 tablet by mouth daily.   pantoprazole 40 MG tablet Commonly known  as: Protonix Take 1 tablet (40 mg total) by mouth 2 (two) times daily.   rosuvastatin 10 MG tablet Commonly known as: CRESTOR Take 10 mg by mouth daily.   sevelamer carbonate 800 MG tablet Commonly known as: RENVELA Take 800 mg by mouth 2 (two) times daily.   tamsulosin 0.4 MG Caps capsule Commonly known as: FLOMAX Take 0.4 mg by mouth daily.   Ventolin HFA 108 (90 Base) MCG/ACT inhaler Generic drug: albuterol Inhale 2 puffs into the lungs every 4 (four) hours as needed for wheezing or shortness of breath.        The results of significant diagnostics from this hospitalization (including imaging, microbiology, ancillary and laboratory) are listed below for reference.    Procedures and Diagnostic Studies:   No results found.   Labs:   Basic Metabolic Panel: Recent Labs  Lab 05/15/21 0445 05/16/21 0804 05/17/21 0515 05/18/21 0621 05/19/21 0507  NA 136 137 134* 135 136  K 3.7 3.8 3.5 3.4* 3.7  CL 101 99 100 100 100  CO2 28 26 27 26 29   GLUCOSE 122* 113* 197* 81 135*  BUN 69* 65* 60* 63* 55*  CREATININE 6.12* 6.15* 5.82* 6.05* 5.88*  CALCIUM 7.7* 8.3* 7.8* 7.8* 7.7*  PHOS  --   --  4.6  --   --    GFR Estimated Creatinine Clearance: 9.3 mL/min (A) (by C-G formula based on SCr of 5.88 mg/dL (H)). Liver Function Tests: Recent Labs  Lab 05/14/21 1253  AST 16  ALT 17  ALKPHOS 58  BILITOT 0.4  PROT 6.1*  ALBUMIN 2.4*   No results for input(s): LIPASE, AMYLASE in the last 168 hours. No results for input(s): AMMONIA in the last 168 hours. Coagulation profile No results for input(s): INR, PROTIME in the last 168 hours.  CBC: Recent Labs  Lab 05/14/21 1253 05/15/21 0445 05/15/21 1728 05/17/21 0515 05/18/21 0621 05/19/21 0507  WBC 10.2 8.8  --  8.0 8.2 7.5  NEUTROABS  --    --   --  5.0 4.8 4.3  HGB 5.5* 6.8* 10.6* 9.8* 10.2* 10.4*  HCT 16.4* 20.1* 31.0* 28.3* 29.2* 30.8*  MCV 99.4 94.8  --  93.1 92.1 96.0  PLT 251 203  --  206 213 204   Cardiac Enzymes: No results for input(s): CKTOTAL, CKMB, CKMBINDEX, TROPONINI in the last 168 hours. BNP: Invalid input(s): POCBNP CBG: Recent Labs  Lab 05/18/21 1130 05/18/21 1625 05/18/21 2052 05/19/21 0803 05/19/21 1157  GLUCAP 108* 108* 322* 104* 200*   D-Dimer No results for input(s): DDIMER in the last 72 hours. Hgb A1c No results for input(s): HGBA1C in the last 72 hours. Lipid Profile No results for input(s): CHOL, HDL, LDLCALC, TRIG, CHOLHDL, LDLDIRECT in the last 72 hours. Thyroid function studies No results for input(s): TSH, T4TOTAL, T3FREE, THYROIDAB in the last 72 hours.  Invalid input(s): FREET3 Anemia work up No results for input(s): VITAMINB12, FOLATE, FERRITIN, TIBC, IRON, RETICCTPCT in the last 72 hours. Microbiology Recent Results (from the past 240 hour(s))  Resp Panel by RT-PCR (Flu A&B, Covid) Nasopharyngeal Swab     Status: None   Collection Time: 05/14/21  4:03 PM   Specimen: Nasopharyngeal Swab; Nasopharyngeal(NP) swabs in vial transport medium  Result Value Ref Range Status   SARS Coronavirus 2 by RT PCR NEGATIVE NEGATIVE Final    Comment: (NOTE) SARS-CoV-2 target nucleic acids are NOT DETECTED.  The SARS-CoV-2 RNA is generally detectable in upper respiratory specimens during the acute phase of  infection. The lowest concentration of SARS-CoV-2 viral copies this assay can detect is 138 copies/mL. A negative result does not preclude SARS-Cov-2 infection and should not be used as the sole basis for treatment or other patient management decisions. A negative result may occur with  improper specimen collection/handling, submission of specimen other than nasopharyngeal swab, presence of viral mutation(s) within the areas targeted by this assay, and inadequate number of  viral copies(<138 copies/mL). A negative result must be combined with clinical observations, patient history, and epidemiological information. The expected result is Negative.  Fact Sheet for Patients:  EntrepreneurPulse.com.au  Fact Sheet for Healthcare Providers:  IncredibleEmployment.be  This test is no t yet approved or cleared by the [place] FDA and  has been authorized for detection and/or diagnosis of SARS-CoV-2 by FDA under an Emergency Use Authorization (EUA). This EUA will remain  in effect (meaning this test can be used) for the duration of the COVID-19 declaration under Section 564(b)(1) of the Act, 21 U.S.C.section 360bbb-3(b)(1), unless the authorization is terminated  or revoked sooner.       Influenza A by PCR NEGATIVE NEGATIVE Final   Influenza B by PCR NEGATIVE NEGATIVE Final    Comment: (NOTE) The Xpert Xpress SARS-CoV-2/FLU/RSV plus assay is intended as an aid in the diagnosis of influenza from Nasopharyngeal swab specimens and should not be used as a sole basis for treatment. Nasal washings and aspirates are unacceptable for Xpert Xpress SARS-CoV-2/FLU/RSV testing.  Fact Sheet for Patients: EntrepreneurPulse.com.au  Fact Sheet for Healthcare Providers: IncredibleEmployment.be  This test is not yet approved or cleared by the [place] FDA and has been authorized for detection and/or diagnosis of SARS-CoV-2 by FDA under an Emergency Use Authorization (EUA). This EUA will remain in effect (meaning this test can be used) for the duration of the COVID-19 declaration under Section 564(b)(1) of the Act, 21 U.S.C. section 360bbb-3(b)(1), unless the authorization is terminated or revoked.  Performed at [facility], [address] [place], [place] [address]      Signed: Terrilee Croak  [facility] 05/20/2021, 7:42 AM

## 2021-05-19 NOTE — Progress Notes (Signed)
Manuel Klein , MD [address] [place], [place], [place], [address] [address], [place], [place], [place], [address] Phone: [phone]  Fax: [phone]   Manuel Klein is being followed for anemia   Subjective: Doing well , had a brown bowel movement , no other complaint    Objective: Vital signs in last 24 hours: Vitals:   05/18/21 1955 05/19/21 0338 05/19/21 0500 05/19/21 0802  BP: (!) 161/62 (!) 156/69  (!) 172/68  Pulse: 65 62  65  Resp: 16 16  18   Temp: 98.4 F (36.9 C) 98.7 F (37.1 C)  97.9 F (36.6 C)  TempSrc: Oral Oral  Oral  SpO2: 93% 94%  97%  Weight:   64.3 kg   Height:       Weight change:   Intake/Output Summary (Last 24 hours) at 05/19/2021 1456 Last data filed at 05/19/2021 1300 Gross per 24 hour  Intake 720 ml  Output 800 ml  Net -80 ml     Exam:  Abdomen: soft, nontender, normal bowel sounds   Lab Results: @LABTEST2 @ Micro Results: Recent Results (from the past 240 hour(s))  Resp Panel by RT-PCR (Flu A&B, Covid) Nasopharyngeal Swab     Status: None   Collection Time: 05/14/21  4:03 PM   Specimen: Nasopharyngeal Swab; Nasopharyngeal(NP) swabs in vial transport medium  Result Value Ref Range Status   SARS Coronavirus 2 by RT PCR NEGATIVE NEGATIVE Final    Comment: (NOTE) SARS-CoV-2 target nucleic acids are NOT DETECTED.  The SARS-CoV-2 RNA is generally detectable in upper respiratory specimens during the acute phase of infection. The lowest concentration of SARS-CoV-2 viral copies this assay can detect is 138 copies/mL. A negative result does not preclude SARS-Cov-2 infection and should not be used as the sole basis for treatment or other patient management decisions. A negative result may occur with  improper specimen collection/handling, submission of specimen other than nasopharyngeal swab, presence of viral mutation(s) within the areas targeted by this assay, and inadequate number of viral copies(<138 copies/mL). A  negative result must be combined with clinical observations, patient history, and epidemiological information. The expected result is Negative.  Fact Sheet for Patients:  EntrepreneurPulse.com.au  Fact Sheet for Healthcare Providers:  IncredibleEmployment.be  This test is no t yet approved or cleared by the [place] FDA and  has been authorized for detection and/or diagnosis of SARS-CoV-2 by FDA under an Emergency Use Authorization (EUA). This EUA will remain  in effect (meaning this test can be used) for the duration of the COVID-19 declaration under Section 564(b)(1) of the Act, 21 U.S.C.section 360bbb-3(b)(1), unless the authorization is terminated  or revoked sooner.       Influenza A by PCR NEGATIVE NEGATIVE Final   Influenza B by PCR NEGATIVE NEGATIVE Final    Comment: (NOTE) The Xpert Xpress SARS-CoV-2/FLU/RSV plus assay is intended as an aid in the diagnosis of influenza from Nasopharyngeal swab specimens and should not be used as a sole basis for treatment. Nasal washings and aspirates are unacceptable for Xpert Xpress SARS-CoV-2/FLU/RSV testing.  Fact Sheet for Patients: EntrepreneurPulse.com.au  Fact Sheet for Healthcare Providers: IncredibleEmployment.be  This test is not yet approved or cleared by the [place] FDA and has been authorized for detection and/or diagnosis of SARS-CoV-2 by FDA under an Emergency Use Authorization (EUA). This EUA will remain in effect (meaning this test can be used) for the duration of the COVID-19 declaration under Section 564(b)(1) of the Act, 21 U.S.C. section 360bbb-3(b)(1),  unless the authorization is terminated or revoked.  Performed at [facility] [address] [place], [place] [address]    Studies/Results: No results found. Medications: I have reviewed the patient's current medications. Scheduled Meds:  (feeding supplement)  PROSource Plus  30 mL Oral BID BM   amLODipine  5 mg Oral Daily   epoetin (EPOGEN/PROCRIT) injection  10,000 Units Subcutaneous Weekly   feeding supplement  1 Container Oral TID BM   furosemide  120 mg Oral Daily   gentamicin cream  1 application Topical Daily   insulin aspart  0-6 Units Subcutaneous TID AC & HS   levothyroxine  100 mcg Oral Q0600   losartan  100 mg Oral Daily   magnesium oxide  400 mg Oral Daily   metoprolol tartrate  25 mg Oral BID   multivitamin  1 tablet Oral Daily   pantoprazole (PROTONIX) IV  40 mg Intravenous Q12H   rosuvastatin  10 mg Oral Daily   sevelamer carbonate  800 mg Oral BID WC   tamsulosin  0.4 mg Oral Daily   vitamin B-12  5,000 mcg Oral Daily   Continuous Infusions:  dialysis solution 1.5% low-MG/low-CA     PRN Meds:.acetaminophen **OR** acetaminophen, albuterol, heparin, ondansetron **OR** ondansetron (ZOFRAN) IV   Assessment: Principal Problem:   Absolute anemia Active Problems:   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[facility])   Essential hypertension   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[facility])   Coronary artery disease   Protein-calorie malnutrition, severe   Hypokalemia   ESRD (end stage renal disease) ([facility])   Chronic diastolic CHF (congestive heart failure) ([facility])   CBC Latest Ref Rng & Units 05/19/2021 05/18/2021 05/17/2021  WBC 4.0 - 10.5 K/uL 7.5 8.2 8.0  Hemoglobin 13.0 - 17.0 g/dL 10.4(L) 10.2(L) 9.8(L)  Hematocrit 39.0 - 52.0 % 30.8(L) 29.2(L) 28.3(L)  Platelets 150 - 400 K/uL 204 29 Manuel Klein is a 80 y.o. y/o male with whom I have been consulted for anemia. Recently at [facility] in 10/2020 had EGD+colonoscopy that were negative. Patient is not the best historian, states he had dark green stools last week but denies blood or being black in color. EGD was negative and capsule study of the small bowel also negative. Hb stable.   Plan  Continue PPI as an outpatient  Follow up with his GI  at [facility] , PCP to monitor HB, if he has any overt melena or rectal bleeding in the future suggest tagged RBC scan +/- Meckels scan  I will sign off.  Please call me if any further GI concerns or questions.  We would like to thank you for the opportunity to participate in the care of NAVEEN CLARDY.      I will sign off.  Please call me if any further GI concerns or questions.  We would like to thank you for the opportunity to participate in the care of KORVIN VALENTINE.    LOS: 4 days   Manuel Bellows, MD 05/19/2021, 2:56 PM

## 2021-05-19 NOTE — Progress Notes (Signed)
D/C home order verified. Patient vitals and assessment remain stable. Reviewed AVS, new medication, and discharge instructions with patient and wife. Verbalized understanding. Escorted to medical mall entrance with wheelchair, wife to drive patient home.

## 2021-05-19 NOTE — TOC Transition Note (Signed)
Transition of Care [facility]) - CM/SW Discharge Note   Patient Details  Name: Manuel Klein MRN: [id number] Date of Birth: Aug 21, 1941  [facility] CM/SW Contact:  Magnus Ivan, LCSW Phone Number: 05/19/2021, 3:58 PM   Clinical Narrative:     Patient has orders to discharge home today. CSW notified Malachy Mood with [facility]. Patient has [place] orders already.    Final next level of care: Home w Home Health Services Barriers to Discharge: Barriers Resolved   Patient Goals and CMS Choice        Discharge Placement                       Discharge Plan and Services                          HH Arranged: PT [facility] Agency: [place] Date [place]: 05/19/21   Representative spoke with at [place]: [place]) Interventions     Readmission Risk Interventions No flowsheet data found.

## 2021-05-19 NOTE — Progress Notes (Signed)
[facility]  ROUNDING NOTE   Subjective:   Manuel Klein is a 80 y.o. 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 He presents to the ED with increased weakness and fatigue.   Patient is followed by our clinic for peritoneal dialysis. We have been consulted to manage this treatment during admission.  Patient seen laying in bed Wife at bedside Tolerating meals Denies nausea and vomiting Feels stronger and able to discharge  Objective:  Vital signs in last 24 hours:  Temp:  [97.7 F (36.5 C)-98.7 F (37.1 C)] 97.9 F (36.6 C) (07/02 0802) Pulse Rate:  [62-65] 65 (07/02 0802) Resp:  [16-18] 18 (07/02 0802) BP: (145-172)/(62-69) 172/68 (07/02 0802) SpO2:  [93 %-97 %] 97 % (07/02 0802) Weight:  [64.3 kg] 64.3 kg (07/02 0500)  Weight change:  Filed Weights   05/14/21 1244 05/17/21 0500 05/19/21 0500  Weight: 67 kg 70.8 kg 64.3 kg    Intake/Output: I/O last 3 completed shifts: In: 480 [P.O.:480] Out: 1400 [Urine:1400]   Intake/Output this shift:  Total I/O In: 120 [P.O.:120] Out: -   Physical Exam: General: NAD, resting in bed  Head: Normocephalic, atraumatic. Moist oral mucosal membranes  Eyes: Anicteric  Lungs:  Clear to auscultation, normal breathing effort, room air  Heart: Regular rate and rhythm  Abdomen:  Soft, nontender  Extremities: No peripheral edema   Neurologic: Nonfocal, moving all four extremities  Skin: No lesions  Access: PD catheter    Basic Metabolic Panel: Recent Labs  Lab 05/15/21 0445 05/16/21 0804 05/17/21 0515 05/18/21 0621 05/19/21 0507  NA 136 137 134* 135 136  K 3.7 3.8 3.5 3.4* 3.7  CL 101 99 100 100 100  CO2 28 26 27 26 29   GLUCOSE 122* 113* 197* 81 135*  BUN 69* 65* 60* 63* 55*  CREATININE 6.12* 6.15* 5.82* 6.05* 5.88*  CALCIUM 7.7* 8.3* 7.8* 7.8* 7.7*  PHOS  --   --  4.6  --   --       Liver Function Tests: Recent Labs  Lab 05/14/21 1253  AST 16  ALT 17  ALKPHOS 58  BILITOT 0.4  PROT 6.1*  ALBUMIN 2.4*    No results for input(s): LIPASE, AMYLASE in the last 168 hours. No results for input(s): AMMONIA in the last 168 hours.  CBC: Recent Labs  Lab 05/14/21 1253 05/15/21 0445 05/15/21 1728 05/17/21 0515 05/18/21 0621 05/19/21 0507  WBC 10.2 8.8  --  8.0 8.2 7.5  NEUTROABS  --   --   --  5.0 4.8 4.3  HGB 5.5* 6.8* 10.6* 9.8* 10.2* 10.4*  HCT 16.4* 20.1* 31.0* 28.3* 29.2* 30.8*  MCV 99.4 94.8  --  93.1 92.1 96.0  PLT 251 203  --  206 213 204     Cardiac Enzymes: No results for input(s): CKTOTAL, CKMB, CKMBINDEX, TROPONINI in the last 168 hours.  BNP: Invalid input(s): POCBNP  CBG: Recent Labs  Lab 05/18/21 0735 05/18/21 1130 05/18/21 1625 05/18/21 2052 05/19/21 0803  GLUCAP 91 108* 108* 322* 104*     Microbiology: Results for orders placed or performed during the hospital encounter of 05/14/21  Resp Panel by RT-PCR (Flu A&B, Covid) Nasopharyngeal Swab     Status: None   Collection Time: 05/14/21  4:03 PM   Specimen: Nasopharyngeal Swab; Nasopharyngeal(NP) swabs in vial transport medium  Result Value Ref Range Status   SARS  Coronavirus 2 by RT PCR NEGATIVE NEGATIVE Final    Comment: (NOTE) SARS-CoV-2 target nucleic acids are NOT DETECTED.  The SARS-CoV-2 RNA is generally detectable in upper respiratory specimens during the acute phase of infection. The lowest concentration of SARS-CoV-2 viral copies this assay can detect is 138 copies/mL. A negative result does not preclude SARS-Cov-2 infection and should not be used as the sole basis for treatment or other patient management decisions. A negative result may occur with  improper specimen collection/handling, submission of specimen other than nasopharyngeal swab, presence of viral mutation(s) within the areas targeted by this assay, and inadequate number of viral copies(<138  copies/mL). A negative result must be combined with clinical observations, patient history, and epidemiological information. The expected result is Negative.  Fact Sheet for Patients:  EntrepreneurPulse.com.au  Fact Sheet for Healthcare Providers:  IncredibleEmployment.be  This test is no t yet approved or cleared by the [place] FDA and  has been authorized for detection and/or diagnosis of SARS-CoV-2 by FDA under an Emergency Use Authorization (EUA). This EUA will remain  in effect (meaning this test can be used) for the duration of the COVID-19 declaration under Section 564(b)(1) of the Act, 21 U.S.C.section 360bbb-3(b)(1), unless the authorization is terminated  or revoked sooner.       Influenza A by PCR NEGATIVE NEGATIVE Final   Influenza B by PCR NEGATIVE NEGATIVE Final    Comment: (NOTE) The Xpert Xpress SARS-CoV-2/FLU/RSV plus assay is intended as an aid in the diagnosis of influenza from Nasopharyngeal swab specimens and should not be used as a sole basis for treatment. Nasal washings and aspirates are unacceptable for Xpert Xpress SARS-CoV-2/FLU/RSV testing.  Fact Sheet for Patients: EntrepreneurPulse.com.au  Fact Sheet for Healthcare Providers: IncredibleEmployment.be  This test is not yet approved or cleared by the [place] FDA and has been authorized for detection and/or diagnosis of SARS-CoV-2 by FDA under an Emergency Use Authorization (EUA). This EUA will remain in effect (meaning this test can be used) for the duration of the COVID-19 declaration under Section 564(b)(1) of the Act, 21 U.S.C. section 360bbb-3(b)(1), unless the authorization is terminated or revoked.  Performed at [facility], [address] [place], [place] [address]     Coagulation Studies: No results for input(s): LABPROT, INR in the last 72 hours.  Urinalysis: No results for input(s):  COLORURINE, LABSPEC, PHURINE, GLUCOSEU, HGBUR, BILIRUBINUR, KETONESUR, PROTEINUR, UROBILINOGEN, NITRITE, LEUKOCYTESUR in the last 72 hours.  Invalid input(s): APPERANCEUR    Imaging: No results found.   Medications:    dialysis solution 1.5% low-MG/low-CA       (feeding supplement) PROSource Plus  30 mL Oral BID BM   amLODipine  5 mg Oral Daily   epoetin (EPOGEN/PROCRIT) injection  10,000 Units Subcutaneous Weekly   feeding supplement  1 Container Oral TID BM   furosemide  120 mg Oral Daily   gentamicin cream  1 application Topical Daily   insulin aspart  0-6 Units Subcutaneous TID AC & HS   levothyroxine  100 mcg Oral Q0600   losartan  100 mg Oral Daily   magnesium oxide  400 mg Oral Daily   metoprolol tartrate  25 mg Oral BID   multivitamin  1 tablet Oral Daily   pantoprazole (PROTONIX) IV  40 mg Intravenous Q12H   rosuvastatin  10 mg Oral Daily   sevelamer carbonate  800 mg Oral BID WC   tamsulosin  0.4 mg Oral Daily   vitamin B-12  5,000 mcg Oral  Daily   acetaminophen **OR** acetaminophen, albuterol, heparin, ondansetron **OR** ondansetron (ZOFRAN) IV  Assessment/ Plan:  Manuel Klein is a 80 y.o.  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 He presents to the ED with increased weakness and fatigue.   [facility] 4 cycles, 2L fills, no last fill  End Stage Renal Disease on peritoneal dialysis: Last peritoneal dialysis 05/13/21.  PD catheter intact Will maintain outpatient schedule while admitted PD completed overnight and tolerated well.  UF 143ml, per HD RN note   2. Anemia of chronic kidney disease microcytic.  Lab Results  Component Value Date   HGB 10.4 (L) 05/19/2021   Hemoccult stool positive on admission Received transfusions during this admission EDG completed by Gi-no active bleeding, biopsies taken of enlarged gastric  folds. Capsule study completed.  EPO ordered weekly and will be managed with outpatient followup Stable at this time.  3. Secondary Hyperparathyroidism: with outpatient labs: calcium 7.9 on 11/15/20.   Lab Results  Component Value Date   CALCIUM 7.7 (L) 05/19/2021   PHOS 4.6 05/17/2021  Renvela with meals Phosphorus 4.6, at target  4. Hypertension Home regimen includes amlodipine, hydralazine,losartan, metoprolol and furosemide. Currently receiving amlodipine and metoprolol. Continues to be elevated. Will monitor        LOS: 4 Sanyah Molnar 7/2/202210:40 AM

## 2021-05-22 ENCOUNTER — Encounter: Payer: Self-pay | Admitting: Gastroenterology

## 2021-05-29 ENCOUNTER — Other Ambulatory Visit: Payer: Self-pay

## 2021-08-25 ENCOUNTER — Emergency Department: Payer: Medicare Other

## 2021-08-25 ENCOUNTER — Inpatient Hospital Stay
Admission: EM | Admit: 2021-08-25 | Discharge: 2021-08-30 | DRG: 521 | Disposition: A | Discharge status: Skilled Nursing Facility | Payer: Medicare Other | Attending: Family Medicine | Admitting: Family Medicine

## 2021-08-25 DIAGNOSIS — Z20822 Contact with and (suspected) exposure to covid-19: Secondary | ICD-10-CM | POA: Diagnosis present

## 2021-08-25 DIAGNOSIS — D721 Eosinophilia, unspecified: Principal | ICD-10-CM | POA: Diagnosis present

## 2021-08-25 DIAGNOSIS — R918 Other nonspecific abnormal finding of lung field: Secondary | ICD-10-CM | POA: Diagnosis present

## 2021-08-25 DIAGNOSIS — Z880 Allergy status to penicillin: Secondary | ICD-10-CM

## 2021-08-25 DIAGNOSIS — N186 End stage renal disease: Secondary | ICD-10-CM | POA: Diagnosis present

## 2021-08-25 DIAGNOSIS — R34 Anuria and oliguria: Secondary | ICD-10-CM

## 2021-08-25 DIAGNOSIS — F17200 Nicotine dependence, unspecified, uncomplicated: Secondary | ICD-10-CM | POA: Diagnosis present

## 2021-08-25 DIAGNOSIS — Z881 Allergy status to other antibiotic agents status: Secondary | ICD-10-CM

## 2021-08-25 DIAGNOSIS — E1122 Type 2 diabetes mellitus with diabetic chronic kidney disease: Secondary | ICD-10-CM | POA: Diagnosis present

## 2021-08-25 DIAGNOSIS — I4510 Unspecified right bundle-branch block: Secondary | ICD-10-CM | POA: Diagnosis present

## 2021-08-25 DIAGNOSIS — R011 Cardiac murmur, unspecified: Secondary | ICD-10-CM | POA: Diagnosis not present

## 2021-08-25 DIAGNOSIS — S72012A Unspecified intracapsular fracture of left femur, initial encounter for closed fracture: Secondary | ICD-10-CM | POA: Diagnosis present

## 2021-08-25 DIAGNOSIS — N2581 Secondary hyperparathyroidism of renal origin: Secondary | ICD-10-CM | POA: Diagnosis present

## 2021-08-25 DIAGNOSIS — D631 Anemia in chronic kidney disease: Secondary | ICD-10-CM | POA: Diagnosis present

## 2021-08-25 DIAGNOSIS — W19XXXA Unspecified fall, initial encounter: Secondary | ICD-10-CM

## 2021-08-25 DIAGNOSIS — S72002A Fracture of unspecified part of neck of left femur, initial encounter for closed fracture: Secondary | ICD-10-CM | POA: Diagnosis present

## 2021-08-25 DIAGNOSIS — Z8249 Family history of ischemic heart disease and other diseases of the circulatory system: Secondary | ICD-10-CM

## 2021-08-25 DIAGNOSIS — Z66 Do not resuscitate: Secondary | ICD-10-CM | POA: Diagnosis present

## 2021-08-25 DIAGNOSIS — Z79890 Hormone replacement therapy (postmenopausal): Secondary | ICD-10-CM

## 2021-08-25 DIAGNOSIS — Z992 Dependence on renal dialysis: Secondary | ICD-10-CM | POA: Diagnosis not present

## 2021-08-25 DIAGNOSIS — I2510 Atherosclerotic heart disease of native coronary artery without angina pectoris: Secondary | ICD-10-CM | POA: Diagnosis present

## 2021-08-25 DIAGNOSIS — I132 Hypertensive heart and chronic kidney disease with heart failure and with stage 5 chronic kidney disease, or end stage renal disease: Secondary | ICD-10-CM | POA: Diagnosis present

## 2021-08-25 DIAGNOSIS — Z794 Long term (current) use of insulin: Secondary | ICD-10-CM

## 2021-08-25 DIAGNOSIS — Z7982 Long term (current) use of aspirin: Secondary | ICD-10-CM

## 2021-08-25 DIAGNOSIS — B172 Acute hepatitis E: Secondary | ICD-10-CM | POA: Diagnosis present

## 2021-08-25 DIAGNOSIS — N400 Enlarged prostate without lower urinary tract symptoms: Secondary | ICD-10-CM

## 2021-08-25 DIAGNOSIS — E039 Hypothyroidism, unspecified: Secondary | ICD-10-CM | POA: Diagnosis present

## 2021-08-25 DIAGNOSIS — Z79899 Other long term (current) drug therapy: Secondary | ICD-10-CM

## 2021-08-25 DIAGNOSIS — K219 Gastro-esophageal reflux disease without esophagitis: Secondary | ICD-10-CM | POA: Diagnosis present

## 2021-08-25 DIAGNOSIS — Y92012 Bathroom of single-family (private) house as the place of occurrence of the external cause: Secondary | ICD-10-CM | POA: Diagnosis not present

## 2021-08-25 DIAGNOSIS — Z96649 Presence of unspecified artificial hip joint: Secondary | ICD-10-CM

## 2021-08-25 DIAGNOSIS — R338 Other retention of urine: Secondary | ICD-10-CM

## 2021-08-25 DIAGNOSIS — W182XXA Fall in (into) shower or empty bathtub, initial encounter: Secondary | ICD-10-CM | POA: Diagnosis present

## 2021-08-25 DIAGNOSIS — Z888 Allergy status to other drugs, medicaments and biological substances status: Secondary | ICD-10-CM

## 2021-08-25 DIAGNOSIS — I5032 Chronic diastolic (congestive) heart failure: Secondary | ICD-10-CM | POA: Diagnosis present

## 2021-08-25 DIAGNOSIS — I10 Essential (primary) hypertension: Secondary | ICD-10-CM

## 2021-08-25 DIAGNOSIS — I252 Old myocardial infarction: Secondary | ICD-10-CM

## 2021-08-25 DIAGNOSIS — Z882 Allergy status to sulfonamides status: Secondary | ICD-10-CM

## 2021-08-25 DIAGNOSIS — Z833 Family history of diabetes mellitus: Secondary | ICD-10-CM

## 2021-08-25 DIAGNOSIS — E785 Hyperlipidemia, unspecified: Secondary | ICD-10-CM | POA: Diagnosis present

## 2021-08-25 DIAGNOSIS — Y93E1 Activity, personal bathing and showering: Secondary | ICD-10-CM

## 2021-08-25 DIAGNOSIS — Z952 Presence of prosthetic heart valve: Secondary | ICD-10-CM

## 2021-08-25 DIAGNOSIS — N401 Enlarged prostate with lower urinary tract symptoms: Secondary | ICD-10-CM | POA: Diagnosis present

## 2021-08-25 DIAGNOSIS — Z955 Presence of coronary angioplasty implant and graft: Secondary | ICD-10-CM

## 2021-08-25 DIAGNOSIS — J45909 Unspecified asthma, uncomplicated: Secondary | ICD-10-CM | POA: Diagnosis present

## 2021-08-25 DIAGNOSIS — N35912 Unspecified bulbous urethral stricture, male: Secondary | ICD-10-CM | POA: Diagnosis present

## 2021-08-25 DIAGNOSIS — R339 Retention of urine, unspecified: Secondary | ICD-10-CM | POA: Diagnosis not present

## 2021-08-25 LAB — CBC WITH DIFFERENTIAL/PLATELET
Abs Immature Granulocytes: 0.12 10*3/uL — ABNORMAL HIGH (ref 0.00–0.07)
Band Neutrophils: 0 %
Basophils Absolute: 0.1 10*3/uL (ref 0.0–0.1)
Basophils Relative: 0 %
Blasts: 0 %
Eosinophils Absolute: 3.7 10*3/uL — ABNORMAL HIGH (ref 0.0–0.5)
Eosinophils Relative: 23 %
HCT: 32.9 % — ABNORMAL LOW (ref 39.0–52.0)
Hemoglobin: 11 g/dL — ABNORMAL LOW (ref 13.0–17.0)
Immature Granulocytes: 0 %
Lymphocytes Relative: 7 %
Lymphs Abs: 1.2 10*3/uL (ref 0.7–4.0)
MCH: 31 pg (ref 26.0–34.0)
MCHC: 33.4 g/dL (ref 30.0–36.0)
MCV: 92.7 fL (ref 80.0–100.0)
Metamyelocytes Relative: 0 %
Monocytes Absolute: 1.1 10*3/uL — ABNORMAL HIGH (ref 0.1–1.0)
Monocytes Relative: 7 %
Myelocytes: 0 %
Neutro Abs: 10.1 10*3/uL — ABNORMAL HIGH (ref 1.7–7.7)
Neutrophils Relative %: 62 %
Other: 0 %
Platelets: 270 10*3/uL (ref 150–400)
Promyelocytes Relative: 0 %
RBC: 3.55 MIL/uL — ABNORMAL LOW (ref 4.22–5.81)
RDW: 15.7 % — ABNORMAL HIGH (ref 11.5–15.5)
Smear Review: NORMAL
WBC: 16.3 10*3/uL — ABNORMAL HIGH (ref 4.0–10.5)
nRBC: 0 % (ref 0.0–0.2)
nRBC: 0 /100 WBC

## 2021-08-25 LAB — BASIC METABOLIC PANEL
Anion gap: 13 (ref 5–15)
BUN: 67 mg/dL — ABNORMAL HIGH (ref 8–23)
CO2: 25 mmol/L (ref 22–32)
Calcium: 7.9 mg/dL — ABNORMAL LOW (ref 8.9–10.3)
Chloride: 100 mmol/L (ref 98–111)
Creatinine, Ser: 7.67 mg/dL — ABNORMAL HIGH (ref 0.61–1.24)
GFR, Estimated: 7 mL/min — ABNORMAL LOW (ref >60)
Glucose, Bld: 199 mg/dL — ABNORMAL HIGH (ref 70–99)
Potassium: 3.5 mmol/L (ref 3.5–5.1)
Sodium: 138 mmol/L (ref 135–145)

## 2021-08-25 LAB — PROTIME-INR
INR: 1.1 (ref 0.8–1.2)
Prothrombin Time: 13.7 seconds (ref 11.4–15.2)

## 2021-08-25 MED ORDER — CHLORHEXIDINE GLUCONATE 4 % EX LIQD
1.0000 "application " | Freq: Once | CUTANEOUS | Status: AC
  Administered 2021-08-26: 1 via TOPICAL

## 2021-08-25 MED ORDER — MORPHINE SULFATE (PF) 2 MG/ML IV SOLN
1.0000 mg | INTRAVENOUS | Status: DC | PRN
  Administered 2021-08-25: 1 mg via INTRAVENOUS
  Filled 2021-08-25: qty 1

## 2021-08-25 MED ORDER — CLINDAMYCIN PHOSPHATE 600 MG/50ML IV SOLN
600.0000 mg | Freq: Three times a day (TID) | INTRAVENOUS | Status: DC
  Filled 2021-08-25 (×3): qty 50

## 2021-08-25 MED ORDER — CLINDAMYCIN PHOSPHATE 600 MG/50ML IV SOLN
600.0000 mg | INTRAVENOUS | Status: AC
  Administered 2021-08-26: 600 mg via INTRAVENOUS
  Filled 2021-08-25: qty 50

## 2021-08-25 MED ORDER — CEFAZOLIN SODIUM-DEXTROSE 1-4 GM/50ML-% IV SOLN
1.0000 g | INTRAVENOUS | Status: AC
  Administered 2021-08-26 (×2): 1 g via INTRAVENOUS
  Filled 2021-08-25: qty 50

## 2021-08-25 MED ORDER — MORPHINE SULFATE (PF) 2 MG/ML IV SOLN
2.0000 mg | Freq: Once | INTRAVENOUS | Status: AC
  Administered 2021-08-25: 2 mg via INTRAVENOUS
  Filled 2021-08-25: qty 1

## 2021-08-25 MED ORDER — SODIUM CHLORIDE 0.9 % IV SOLN: INTRAVENOUS | Status: DC

## 2021-08-25 MED ORDER — SODIUM CHLORIDE 0.9 % IV SOLN: Freq: Once | INTRAVENOUS | Status: AC

## 2021-08-25 NOTE — ED Notes (Signed)
Complete orders for inpatient. MD aware

## 2021-08-25 NOTE — ED Triage Notes (Signed)
No loc, slipped in shower, no hit head, no thinners, co of left leg pain, htn , swelling to left leg ,

## 2021-08-25 NOTE — ED Provider Notes (Signed)
[facility] Emergency Department Provider Note   ____________________________________________   I have reviewed the triage vital signs and the nursing notes.   HISTORY  Chief Complaint Left leg pain   History limited by: Not Limited   HPI Manuel Klein is a 80 y.o. male who presents to the emergency department today because of concern for left leg pain. The patient states that the pain started after he fell in the shower. Says that he slipped because the floor had just been cleaned. He denies any other pain. States the pain is located in his mid left thigh. The pain is severe. It is worse with movement.    Records reviewed. Per medical record review patient has a history of CHF, HLD, HTN.  Past Medical History:  Diagnosis Date   Asthma    CHF (congestive heart failure) (HCC)    Chronic kidney disease    stage IV   Coronary artery disease    Diabetes mellitus without complication (HCC)    GERD (gastroesophageal reflux disease)    Hyperlipidemia    Hypertension    Myocardial infarction ([place])    Seizures ([place])    none since 20"s    Patient Active Problem List   Diagnosis Date Noted   Protein-calorie malnutrition, severe 05/15/2021   Hypokalemia    ESRD (end stage renal disease) (HCC)    Chronic diastolic CHF (congestive heart failure) (HCC)    Absolute anemia 05/14/2021   Coronary artery disease    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[place]) 05/11/2021   Symptomatic anemia 05/11/2021   Hyperlipidemia 11/03/2017   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[place]) 11/03/2017   Essential hypertension 11/03/2017    Past Surgical History:  Procedure Laterality Date   CAPD INSERTION N/A 11/07/2017   Procedure: LAPAROSCOPIC INSERTION CONTINUOUS AMBULATORY PERITONEAL DIALYSIS  (CAPD) CATHETER;  Surgeon: Katha Cabal, MD;  Location: [facility] ORS;  Service: Vascular;  Laterality: N/A;   CARDIAC SURGERY     stent  placement   CATARACT EXTRACTION, BILATERAL     COLONOSCOPY     CORONARY ANGIOPLASTY     ESOPHAGOGASTRODUODENOSCOPY (EGD) WITH PROPOFOL N/A 05/16/2021   Procedure: ESOPHAGOGASTRODUODENOSCOPY (EGD) WITH PROPOFOL;  Surgeon: Jonathon Bellows, MD;  Location: [facility] ENDOSCOPY;  Service: Gastroenterology;  Laterality: N/A;   GIVENS CAPSULE STUDY N/A 05/18/2021   Procedure: GIVENS CAPSULE STUDY;  Surgeon: Jonathon Bellows, MD;  Location: [facility] ENDOSCOPY;  Service: Gastroenterology;  Laterality: N/A;   RETINAL DETACHMENT SURGERY      Prior to Admission medications   Medication Sig Start Date End Date Taking? Authorizing Provider  amLODipine (NORVASC) 5 MG tablet Take 5 mg by mouth daily.    [provider]  aspirin 81 MG tablet Take 81 mg by mouth daily.  11/30/08   [provider]  Cyanocobalamin 5000 MCG CAPS Take 5,000 Units by mouth daily.    [provider]  furosemide (LASIX) 40 MG tablet Take 120 mg by mouth daily.    [provider]  hydrALAZINE (APRESOLINE) 25 MG tablet Take 25 mg by mouth 2 (two) times daily. 02/06/21   [provider]  insulin lispro (HUMALOG) 100 UNIT/ML KiwkPen Inject 6-10 Units into the skin 3 (three) times daily with meals.    [provider]  levothyroxine (SYNTHROID) 100 MCG tablet Take 100 mcg by mouth daily. 04/25/21   [provider]  losartan (COZAAR) 100 MG tablet Take 100 mg by mouth daily.  [provider]  magnesium oxide (MAG-OX) 400 MG tablet Take 400 mg by mouth daily.    [provider]  metoprolol tartrate (LOPRESSOR) 25 MG tablet Take 25 mg by mouth 2 (two) times daily. 04/25/21   [provider]  multivitamin (RENA-VIT) TABS tablet Take 1 tablet by mouth daily.    [provider]  pantoprazole (PROTONIX) 40 MG tablet Take 1 tablet (40 mg total) by mouth 2 (two) times daily. 05/19/21 06/18/21  Terrilee Croak, MD  rosuvastatin (CRESTOR) 10 MG tablet Take 10 mg by mouth daily.     [provider]  sevelamer carbonate (RENVELA) 800 MG tablet Take 800 mg by mouth 2 (two) times daily.    [provider]  tamsulosin (FLOMAX) 0.4 MG CAPS capsule Take 0.4 mg by mouth daily.    [provider]  VENTOLIN HFA 108 (90 Base) MCG/ACT inhaler Inhale 2 puffs into the lungs every 4 (four) hours as needed for wheezing or shortness of breath.    [provider]    Allergies Hydralazine, Atorvastatin, Carvedilol, Ciprofloxacin, Other, Penicillins, Sulfa antibiotics, and Sulfasalazine  Family History  Problem Relation Age of Onset   Heart disease Mother    Diabetes Mother    Diabetes Father     Social History Social History   Tobacco Use   Smoking status: Light Smoker   Smokeless tobacco: Never  Vaping Use   Vaping Use: Never used  Substance Use Topics   Alcohol use: No   Drug use: No    Review of Systems Constitutional: No fever/chills Eyes: No visual changes. ENT: No sore throat. Cardiovascular: Denies chest pain. Respiratory: Denies shortness of breath. Gastrointestinal: No abdominal pain.  No nausea, no vomiting.  No diarrhea.   Genitourinary: Negative for dysuria. Musculoskeletal: Positive for left leg pain. Skin: Negative for rash. Neurological: Negative for headaches, focal weakness or numbness.  ____________________________________________   PHYSICAL EXAM:  VITAL SIGNS: ED Triage Vitals  Enc Vitals Group     BP 08/25/21 2217 (!) 173/83     Pulse Rate 08/25/21 2215 67     Resp 08/25/21 2215 17     Temp 08/25/21 2215 98.5 F (36.9 C)     Temp Source 08/25/21 2215 Oral     SpO2 08/25/21 2215 99 %     Weight 08/25/21 2214 176 lb 5.9 oz (80 kg)     Height 08/25/21 2214 5\' 9"  (1.753 m)     Head Circumference --      Peak Flow --      Pain Score 08/25/21 2214 6   Constitutional: Alert and oriented.  Eyes: Conjunctivae are normal.  ENT      Head: Normocephalic and atraumatic.      Nose: No  congestion/rhinnorhea.      Mouth/Throat: Mucous membranes are moist.      Neck: No stridor. Hematological/Lymphatic/Immunilogical: No cervical lymphadenopathy. Cardiovascular: Normal rate, regular rhythm.  No murmurs, rubs, or gallops.  Respiratory: Normal respiratory effort without tachypnea nor retractions. Breath sounds are clear and equal bilaterally. No wheezes/rales/rhonchi. Gastrointestinal: Soft and non tender. No rebound. No guarding.  Genitourinary: Deferred Musculoskeletal: Tender to palpation and manipulation of left thigh. DP 2+.  Neurologic:  Normal speech and language. No gross focal neurologic deficits are appreciated.  Skin:  Skin is warm, dry and intact. No rash noted. Psychiatric: Mood and affect are normal. Speech and behavior are normal. Patient exhibits appropriate insight and judgment.  ____________________________________________    LABS (pertinent positives/negatives)  BMP na 138, k 3.5, glu 199, cr 7.67 CBC wbc 16.3, hgb 11.0, plt 270  ____________________________________________   EKG  I, Nance Pear, attending physician, personally viewed and interpreted this EKG  EKG Time: 2333 Rate: 72 Rhythm: sinus rhythm Axis: right axis deviation Intervals: qtc 542 QRS: RBBB ST changes: no st elevation Impression: abnormal ekg  ____________________________________________    RADIOLOGY  Left femur Acute fracture of left femoral neck  ____________________________________________   PROCEDURES  Procedures  ____________________________________________   INITIAL IMPRESSION / ASSESSMENT AND PLAN / ED COURSE  Pertinent labs & imaging results that were available during my care of the patient were reviewed by me and considered in my medical decision making (see chart for details).   Patient presented to the emergency department today after a fall in the shower and complaining of left upper leg pain. X-ray is consistent with acute fracture of the  left femoral neck. Discussed this finding with the patient. Discussed need for admission and surgery. Discussed with Dr. Sabra Heck with orthopedics and hospitalist for admission.   ____________________________________________   FINAL CLINICAL IMPRESSION(S) / ED DIAGNOSES  Final diagnoses:  Fall, initial encounter  Closed fracture of left hip, initial encounter [facility])     Note: This dictation was prepared with Dragon dictation. Any transcriptional errors that result from this process are unintentional     Nance Pear, MD 08/26/21 [phone]

## 2021-08-26 ENCOUNTER — Inpatient Hospital Stay: Payer: Medicare Other | Admitting: Anesthesiology

## 2021-08-26 ENCOUNTER — Inpatient Hospital Stay: Payer: Medicare Other

## 2021-08-26 ENCOUNTER — Other Ambulatory Visit: Payer: Self-pay

## 2021-08-26 ENCOUNTER — Encounter
Admission: EM | Disposition: A | Discharge status: Skilled Nursing Facility | Payer: Self-pay | Source: Home / Self Care | Attending: Internal Medicine

## 2021-08-26 DIAGNOSIS — W19XXXA Unspecified fall, initial encounter: Secondary | ICD-10-CM

## 2021-08-26 DIAGNOSIS — R339 Retention of urine, unspecified: Secondary | ICD-10-CM

## 2021-08-26 HISTORY — PX: CYSTOSCOPY: SHX5120

## 2021-08-26 HISTORY — PX: HIP ARTHROPLASTY: SHX981

## 2021-08-26 LAB — CBC
HCT: 28.9 % — ABNORMAL LOW (ref 39.0–52.0)
Hemoglobin: 9.6 g/dL — ABNORMAL LOW (ref 13.0–17.0)
MCH: 31.1 pg (ref 26.0–34.0)
MCHC: 33.2 g/dL (ref 30.0–36.0)
MCV: 93.5 fL (ref 80.0–100.0)
Platelets: 221 10*3/uL (ref 150–400)
RBC: 3.09 MIL/uL — ABNORMAL LOW (ref 4.22–5.81)
RDW: 15.6 % — ABNORMAL HIGH (ref 11.5–15.5)
WBC: 14.7 10*3/uL — ABNORMAL HIGH (ref 4.0–10.5)
nRBC: 0 % (ref 0.0–0.2)

## 2021-08-26 LAB — GLUCOSE, CAPILLARY
Glucose-Capillary: 207 mg/dL — ABNORMAL HIGH (ref 70–99)
Glucose-Capillary: 164 mg/dL — ABNORMAL HIGH (ref 70–99)
Glucose-Capillary: 150 mg/dL — ABNORMAL HIGH (ref 70–99)
Glucose-Capillary: 115 mg/dL — ABNORMAL HIGH (ref 70–99)
Glucose-Capillary: 138 mg/dL — ABNORMAL HIGH (ref 70–99)

## 2021-08-26 LAB — RESP PANEL BY RT-PCR (FLU A&B, COVID) ARPGX2
Influenza A by PCR: NEGATIVE
Influenza B by PCR: NEGATIVE
SARS Coronavirus 2 by RT PCR: NEGATIVE

## 2021-08-26 LAB — BASIC METABOLIC PANEL
Anion gap: 13 (ref 5–15)
BUN: 69 mg/dL — ABNORMAL HIGH (ref 8–23)
CO2: 24 mmol/L (ref 22–32)
Calcium: 7.7 mg/dL — ABNORMAL LOW (ref 8.9–10.3)
Chloride: 102 mmol/L (ref 98–111)
Creatinine, Ser: 7.53 mg/dL — ABNORMAL HIGH (ref 0.61–1.24)
GFR, Estimated: 7 mL/min — ABNORMAL LOW (ref >60)
Glucose, Bld: 192 mg/dL — ABNORMAL HIGH (ref 70–99)
Potassium: 3.4 mmol/L — ABNORMAL LOW (ref 3.5–5.1)
Sodium: 139 mmol/L (ref 135–145)

## 2021-08-26 LAB — SURGICAL PCR SCREEN
MRSA, PCR: NEGATIVE
Staphylococcus aureus: NEGATIVE

## 2021-08-26 SURGERY — HEMIARTHROPLASTY, HIP, DIRECT ANTERIOR APPROACH, FOR FRACTURE
Anesthesia: Choice | Site: Hip | Laterality: Left

## 2021-08-26 MED ORDER — INSULIN ASPART 100 UNIT/ML IJ SOLN: 6.0000 [IU] | Freq: Three times a day (TID) | INTRAMUSCULAR | Status: DC

## 2021-08-26 MED ORDER — AMLODIPINE BESYLATE 5 MG PO TABS
5.0000 mg | ORAL_TABLET | Freq: Every day | ORAL | Status: DC
  Administered 2021-08-26 – 2021-08-30 (×4): 5 mg via ORAL
  Filled 2021-08-26 (×4): qty 1

## 2021-08-26 MED ORDER — METOCLOPRAMIDE HCL 5 MG/ML IJ SOLN
5.0000 mg | Freq: Three times a day (TID) | INTRAMUSCULAR | Status: DC | PRN
  Filled 2021-08-26: qty 2

## 2021-08-26 MED ORDER — PROPOFOL 10 MG/ML IV BOLUS
INTRAVENOUS | Status: DC | PRN
Start: 2021-08-26 — End: 2021-08-26
  Administered 2021-08-26 (×2): 20 mg via INTRAVENOUS

## 2021-08-26 MED ORDER — SODIUM CHLORIDE 0.45 % IV SOLN: INTRAVENOUS | Status: DC

## 2021-08-26 MED ORDER — FUROSEMIDE 40 MG PO TABS
120.0000 mg | ORAL_TABLET | Freq: Every day | ORAL | Status: DC
  Administered 2021-08-26 – 2021-08-28 (×3): 120 mg via ORAL
  Filled 2021-08-26 (×3): qty 3

## 2021-08-26 MED ORDER — FENTANYL CITRATE (PF) 100 MCG/2ML IJ SOLN: 25.0000 ug | INTRAMUSCULAR | Status: DC | PRN

## 2021-08-26 MED ORDER — MAGNESIUM HYDROXIDE 400 MG/5ML PO SUSP: 30.0000 mL | Freq: Every day | ORAL | Status: DC | PRN

## 2021-08-26 MED ORDER — TAMSULOSIN HCL 0.4 MG PO CAPS
0.4000 mg | ORAL_CAPSULE | Freq: Every day | ORAL | Status: DC
  Administered 2021-08-26 – 2021-08-30 (×4): 0.4 mg via ORAL
  Filled 2021-08-26 (×4): qty 1

## 2021-08-26 MED ORDER — MAGNESIUM OXIDE 400 MG PO TABS
400.0000 mg | ORAL_TABLET | Freq: Every day | ORAL | Status: DC
  Administered 2021-08-26 – 2021-08-30 (×4): 400 mg via ORAL
  Filled 2021-08-26 (×8): qty 1

## 2021-08-26 MED ORDER — TRANEXAMIC ACID-NACL 1000-0.7 MG/100ML-% IV SOLN
1000.0000 mg | INTRAVENOUS | Status: AC
  Administered 2021-08-26: 1000 mg via INTRAVENOUS
  Filled 2021-08-26: qty 100

## 2021-08-26 MED ORDER — SODIUM CHLORIDE 0.9% FLUSH
3.0000 mL | Freq: Two times a day (BID) | INTRAVENOUS | Status: DC
  Administered 2021-08-26 – 2021-08-30 (×10): 3 mL via INTRAVENOUS

## 2021-08-26 MED ORDER — TRAZODONE HCL 50 MG PO TABS: 25.0000 mg | ORAL_TABLET | Freq: Every evening | ORAL | Status: DC | PRN

## 2021-08-26 MED ORDER — ACETAMINOPHEN 650 MG RE SUPP: 650.0000 mg | Freq: Four times a day (QID) | RECTAL | Status: DC | PRN

## 2021-08-26 MED ORDER — SEVELAMER CARBONATE 800 MG PO TABS
800.0000 mg | ORAL_TABLET | Freq: Two times a day (BID) | ORAL | Status: DC
  Administered 2021-08-26 – 2021-08-30 (×5): 800 mg via ORAL
  Filled 2021-08-26 (×7): qty 1

## 2021-08-26 MED ORDER — BUPIVACAINE-EPINEPHRINE (PF) 0.25% -1:200000 IJ SOLN
INTRAMUSCULAR | Status: DC | PRN
  Administered 2021-08-26: 30 mL

## 2021-08-26 MED ORDER — ONDANSETRON HCL 4 MG/2ML IJ SOLN
INTRAMUSCULAR | Status: AC
  Administered 2021-08-28: 4 mg via INTRAVENOUS
  Filled 2021-08-26: qty 2

## 2021-08-26 MED ORDER — LEVOTHYROXINE SODIUM 50 MCG PO TABS
100.0000 ug | ORAL_TABLET | Freq: Every day | ORAL | Status: DC
  Administered 2021-08-26 – 2021-08-30 (×5): 100 ug via ORAL
  Filled 2021-08-26 (×5): qty 2

## 2021-08-26 MED ORDER — FERROUS SULFATE 325 (65 FE) MG PO TABS
325.0000 mg | ORAL_TABLET | Freq: Every day | ORAL | Status: DC
  Administered 2021-08-27 – 2021-08-30 (×3): 325 mg via ORAL
  Filled 2021-08-26 (×3): qty 1

## 2021-08-26 MED ORDER — CLINDAMYCIN PHOSPHATE 600 MG/50ML IV SOLN
INTRAVENOUS | Status: AC
  Filled 2021-08-26: qty 50

## 2021-08-26 MED ORDER — ASPIRIN EC 325 MG PO TBEC
325.0000 mg | DELAYED_RELEASE_TABLET | Freq: Every day | ORAL | Status: DC
  Administered 2021-08-27 – 2021-08-30 (×3): 325 mg via ORAL
  Filled 2021-08-26 (×3): qty 1

## 2021-08-26 MED ORDER — LOSARTAN POTASSIUM 50 MG PO TABS
100.0000 mg | ORAL_TABLET | Freq: Every day | ORAL | Status: DC
  Administered 2021-08-26 – 2021-08-30 (×4): 100 mg via ORAL
  Filled 2021-08-26 (×4): qty 2

## 2021-08-26 MED ORDER — ALBUTEROL SULFATE (2.5 MG/3ML) 0.083% IN NEBU: 2.5000 mg | INHALATION_SOLUTION | RESPIRATORY_TRACT | Status: DC | PRN

## 2021-08-26 MED ORDER — RENA-VITE PO TABS
1.0000 | ORAL_TABLET | Freq: Every day | ORAL | Status: DC
  Administered 2021-08-27 – 2021-08-30 (×3): 1 via ORAL
  Filled 2021-08-26 (×5): qty 1

## 2021-08-26 MED ORDER — 0.9 % SODIUM CHLORIDE (POUR BTL) OPTIME
TOPICAL | Status: DC | PRN
  Administered 2021-08-26: 1000 mL

## 2021-08-26 MED ORDER — METHOCARBAMOL 1000 MG/10ML IJ SOLN
500.0000 mg | Freq: Four times a day (QID) | INTRAVENOUS | Status: DC | PRN
  Filled 2021-08-26: qty 5

## 2021-08-26 MED ORDER — CLINDAMYCIN PHOSPHATE 600 MG/50ML IV SOLN
600.0000 mg | Freq: Three times a day (TID) | INTRAVENOUS | Status: DC
  Administered 2021-08-27 (×2): 600 mg via INTRAVENOUS
  Filled 2021-08-26 (×3): qty 50

## 2021-08-26 MED ORDER — ROSUVASTATIN CALCIUM 10 MG PO TABS
10.0000 mg | ORAL_TABLET | Freq: Every day | ORAL | Status: DC
  Administered 2021-08-26 – 2021-08-30 (×3): 10 mg via ORAL
  Filled 2021-08-26 (×5): qty 1

## 2021-08-26 MED ORDER — LACTATED RINGERS IV SOLN: INTRAVENOUS | Status: DC | PRN

## 2021-08-26 MED ORDER — METOPROLOL TARTRATE 25 MG PO TABS
25.0000 mg | ORAL_TABLET | Freq: Two times a day (BID) | ORAL | Status: DC
  Administered 2021-08-26 – 2021-08-30 (×8): 25 mg via ORAL
  Filled 2021-08-26 (×8): qty 1

## 2021-08-26 MED ORDER — HYDROCODONE-ACETAMINOPHEN 7.5-325 MG PO TABS
1.0000 | ORAL_TABLET | Freq: Once | ORAL | Status: DC | PRN
Start: 2021-08-26 — End: 2021-08-26

## 2021-08-26 MED ORDER — ONDANSETRON HCL 4 MG PO TABS: 4.0000 mg | ORAL_TABLET | Freq: Four times a day (QID) | ORAL | Status: DC | PRN

## 2021-08-26 MED ORDER — HYDRALAZINE HCL 25 MG PO TABS
25.0000 mg | ORAL_TABLET | Freq: Two times a day (BID) | ORAL | Status: DC
  Administered 2021-08-26 – 2021-08-30 (×7): 25 mg via ORAL
  Filled 2021-08-26 (×7): qty 1

## 2021-08-26 MED ORDER — ONDANSETRON HCL 4 MG/2ML IJ SOLN
4.0000 mg | Freq: Four times a day (QID) | INTRAMUSCULAR | Status: DC | PRN
  Administered 2021-08-26 – 2021-08-27 (×2): 4 mg via INTRAVENOUS
  Filled 2021-08-26: qty 2

## 2021-08-26 MED ORDER — FENTANYL CITRATE (PF) 100 MCG/2ML IJ SOLN
INTRAMUSCULAR | Status: AC
  Filled 2021-08-26: qty 2

## 2021-08-26 MED ORDER — DOCUSATE SODIUM 100 MG PO CAPS
100.0000 mg | ORAL_CAPSULE | Freq: Two times a day (BID) | ORAL | Status: DC
  Administered 2021-08-26 – 2021-08-30 (×6): 100 mg via ORAL
  Filled 2021-08-26 (×6): qty 1

## 2021-08-26 MED ORDER — METHOCARBAMOL 500 MG PO TABS
500.0000 mg | ORAL_TABLET | Freq: Four times a day (QID) | ORAL | Status: DC | PRN
  Administered 2021-08-28 – 2021-08-29 (×3): 500 mg via ORAL
  Filled 2021-08-26 (×3): qty 1

## 2021-08-26 MED ORDER — TRANEXAMIC ACID-NACL 1000-0.7 MG/100ML-% IV SOLN
INTRAVENOUS | Status: AC
  Filled 2021-08-26: qty 100

## 2021-08-26 MED ORDER — CEFAZOLIN SODIUM-DEXTROSE 2-4 GM/100ML-% IV SOLN
INTRAVENOUS | Status: AC
  Filled 2021-08-26: qty 100

## 2021-08-26 MED ORDER — PROPOFOL 500 MG/50ML IV EMUL
INTRAVENOUS | Status: DC | PRN
  Administered 2021-08-26: 20 ug/kg/min via INTRAVENOUS

## 2021-08-26 MED ORDER — CEFAZOLIN SODIUM-DEXTROSE 1-4 GM/50ML-% IV SOLN
1.0000 g | Freq: Three times a day (TID) | INTRAVENOUS | Status: DC
  Administered 2021-08-26 – 2021-08-27 (×2): 1 g via INTRAVENOUS
  Filled 2021-08-26 (×4): qty 50

## 2021-08-26 MED ORDER — SODIUM CHLORIDE 0.9 % IV SOLN: 250.0000 mL | INTRAVENOUS | Status: DC | PRN

## 2021-08-26 MED ORDER — INSULIN ASPART 100 UNIT/ML IJ SOLN
0.0000 [IU] | Freq: Three times a day (TID) | INTRAMUSCULAR | Status: DC
  Administered 2021-08-26: 2 [IU] via SUBCUTANEOUS
  Administered 2021-08-30: 1 [IU] via SUBCUTANEOUS
  Filled 2021-08-26 (×2): qty 1

## 2021-08-26 MED ORDER — MUPIROCIN 2 % EX OINT
1.0000 "application " | TOPICAL_OINTMENT | Freq: Two times a day (BID) | CUTANEOUS | Status: DC
  Administered 2021-08-26 – 2021-08-30 (×8): 1 via NASAL
  Filled 2021-08-26 (×2): qty 22

## 2021-08-26 MED ORDER — MORPHINE SULFATE (PF) 2 MG/ML IV SOLN: 0.5000 mg | INTRAVENOUS | Status: DC | PRN

## 2021-08-26 MED ORDER — EPHEDRINE SULFATE 50 MG/ML IJ SOLN
INTRAMUSCULAR | Status: DC | PRN
  Administered 2021-08-26 (×4): 10 mg via INTRAVENOUS

## 2021-08-26 MED ORDER — BISACODYL 10 MG RE SUPP
10.0000 mg | Freq: Every day | RECTAL | Status: DC | PRN
  Administered 2021-08-28: 10 mg via RECTAL
  Filled 2021-08-26: qty 1

## 2021-08-26 MED ORDER — ALBUTEROL SULFATE HFA 108 (90 BASE) MCG/ACT IN AERS: 2.0000 | INHALATION_SPRAY | RESPIRATORY_TRACT | Status: DC | PRN

## 2021-08-26 MED ORDER — FENTANYL CITRATE (PF) 100 MCG/2ML IJ SOLN
INTRAMUSCULAR | Status: DC | PRN
  Administered 2021-08-26: 50 ug via INTRAVENOUS

## 2021-08-26 MED ORDER — PHENYLEPHRINE HCL (PRESSORS) 10 MG/ML IV SOLN
INTRAVENOUS | Status: DC | PRN
  Administered 2021-08-26 (×2): 200 ug via INTRAVENOUS
  Administered 2021-08-26: 100 ug via INTRAVENOUS
  Administered 2021-08-26 (×5): 200 ug via INTRAVENOUS

## 2021-08-26 MED ORDER — METOCLOPRAMIDE HCL 10 MG PO TABS: 5.0000 mg | ORAL_TABLET | Freq: Three times a day (TID) | ORAL | Status: DC | PRN

## 2021-08-26 MED ORDER — ACETAMINOPHEN 325 MG PO TABS
650.0000 mg | ORAL_TABLET | Freq: Four times a day (QID) | ORAL | Status: DC | PRN
  Administered 2021-08-27: 650 mg via ORAL
  Filled 2021-08-26: qty 2

## 2021-08-26 MED ORDER — SODIUM CHLORIDE 0.9% FLUSH: 3.0000 mL | INTRAVENOUS | Status: DC | PRN

## 2021-08-26 MED ORDER — FLEET ENEMA 7-19 GM/118ML RE ENEM: 1.0000 | ENEMA | Freq: Once | RECTAL | Status: DC | PRN

## 2021-08-26 MED ORDER — ONDANSETRON HCL 4 MG/2ML IJ SOLN
4.0000 mg | Freq: Once | INTRAMUSCULAR | Status: AC | PRN
  Administered 2021-08-26: 4 mg via INTRAVENOUS

## 2021-08-26 MED ORDER — LIDOCAINE HCL (CARDIAC) PF 100 MG/5ML IV SOSY
PREFILLED_SYRINGE | INTRAVENOUS | Status: DC | PRN
  Administered 2021-08-26: 100 mg via INTRAVENOUS

## 2021-08-26 MED ORDER — MIDAZOLAM HCL 2 MG/2ML IJ SOLN
INTRAMUSCULAR | Status: AC
  Filled 2021-08-26: qty 2

## 2021-08-26 MED ORDER — INSULIN ASPART 100 UNIT/ML IJ SOLN: 0.0000 [IU] | Freq: Every day | INTRAMUSCULAR | Status: DC

## 2021-08-26 MED ORDER — VITAMIN B-12 1000 MCG PO TABS
5000.0000 ug | ORAL_TABLET | Freq: Every day | ORAL | Status: DC
  Administered 2021-08-26 – 2021-08-30 (×4): 5000 ug via ORAL
  Filled 2021-08-26 (×5): qty 5

## 2021-08-26 MED ORDER — HYDROCODONE-ACETAMINOPHEN 5-325 MG PO TABS
1.0000 | ORAL_TABLET | ORAL | Status: DC | PRN
  Administered 2021-08-27: 2 via ORAL
  Administered 2021-08-28: 1 via ORAL
  Administered 2021-08-28: 2 via ORAL
  Administered 2021-08-28 – 2021-08-29 (×3): 1 via ORAL
  Administered 2021-08-30: 2 via ORAL
  Administered 2021-08-30: 1 via ORAL
  Filled 2021-08-26: qty 1
  Filled 2021-08-26 (×3): qty 2
  Filled 2021-08-26 (×2): qty 1
  Filled 2021-08-26: qty 2
  Filled 2021-08-26 (×2): qty 1

## 2021-08-26 MED ORDER — MORPHINE SULFATE (PF) 2 MG/ML IV SOLN
2.0000 mg | INTRAVENOUS | Status: DC | PRN
  Administered 2021-08-26 – 2021-08-29 (×7): 2 mg via INTRAVENOUS
  Filled 2021-08-26 (×7): qty 1

## 2021-08-26 MED ORDER — PANTOPRAZOLE SODIUM 40 MG PO TBEC
40.0000 mg | DELAYED_RELEASE_TABLET | Freq: Two times a day (BID) | ORAL | Status: DC
  Administered 2021-08-26 – 2021-08-30 (×6): 40 mg via ORAL
  Filled 2021-08-26 (×7): qty 1

## 2021-08-26 MED ORDER — PROPOFOL 500 MG/50ML IV EMUL
INTRAVENOUS | Status: AC
  Filled 2021-08-26: qty 50

## 2021-08-26 MED ORDER — SODIUM CHLORIDE 0.9 % IV SOLN
Status: DC | PRN
  Administered 2021-08-26: 1000 mL

## 2021-08-26 MED ORDER — BUPIVACAINE HCL (PF) 0.5 % IJ SOLN
INTRAMUSCULAR | Status: DC | PRN
  Administered 2021-08-26: 3 mL

## 2021-08-26 SURGICAL SUPPLY — 53 items
BLADE SAGITTAL WIDE XTHICK NO (BLADE) ×3 IMPLANT
CATH FOLEY 2W COUNCIL 5CC 16FR (CATHETERS) ×3 IMPLANT
CHLORAPREP W/TINT 26 (MISCELLANEOUS) ×6 IMPLANT
COVER BACK TABLE REUSABLE LG (DRAPES) ×3 IMPLANT
COVER MAYO STAND STRL (DRAPES) ×3 IMPLANT
DRAPE INCISE IOBAN 66X60 STRL (DRAPES) ×6 IMPLANT
DRSG AQUACEL AG ADV 3.5X10 (GAUZE/BANDAGES/DRESSINGS) ×3 IMPLANT
DRSG AQUACEL AG ADV 3.5X14 (GAUZE/BANDAGES/DRESSINGS) ×3 IMPLANT
ELECT CAUTERY BLADE 6.4 (BLADE) ×3 IMPLANT
ELECT REM PT RETURN 9FT ADLT (ELECTROSURGICAL) ×3
ELECTRODE REM PT RTRN 9FT ADLT (ELECTROSURGICAL) ×2 IMPLANT
GAUZE 4X4 16PLY ~~LOC~~+RFID DBL (SPONGE) ×3 IMPLANT
GAUZE SPONGE 4X4 12PLY STRL (GAUZE/BANDAGES/DRESSINGS) ×6 IMPLANT
GAUZE XEROFORM 1X8 LF (GAUZE/BANDAGES/DRESSINGS) ×6 IMPLANT
GLOVE SURG ORTHO LTX SZ8.5 (GLOVE) ×3 IMPLANT
GLOVE SURG UNDER LTX SZ8 (GLOVE) ×3 IMPLANT
GOWN STRL REUS W/ TWL LRG LVL3 (GOWN DISPOSABLE) ×4 IMPLANT
GOWN STRL REUS W/TWL LRG LVL3 (GOWN DISPOSABLE) ×2
GOWN STRL REUS W/TWL LRG LVL4 (GOWN DISPOSABLE) ×3 IMPLANT
GUIDEWIRE STR DUAL SENSOR (WIRE) ×3 IMPLANT
HEAD MODULAR ENDO (Orthopedic Implant) ×1 IMPLANT
HEAD UNPLR 52XMDLR STRL HIP (Orthopedic Implant) ×2 IMPLANT
HEMOVAC 400CC 10FR (MISCELLANEOUS) ×3 IMPLANT
IV NS 1000ML (IV SOLUTION) ×1
IV NS 1000ML BAXH (IV SOLUTION) ×2 IMPLANT
KIT TURNOVER KIT A (KITS) ×3 IMPLANT
MANIFOLD NEPTUNE II (INSTRUMENTS) ×3 IMPLANT
NEEDLE FILTER BLUNT 18X 1/2SAF (NEEDLE) ×1
NEEDLE FILTER BLUNT 18X1 1/2 (NEEDLE) ×2 IMPLANT
NEEDLE SPNL 18GX3.5 QUINCKE PK (NEEDLE) ×6 IMPLANT
NS IRRIG 1000ML POUR BTL (IV SOLUTION) ×3 IMPLANT
PACK HIP PROSTHESIS (MISCELLANEOUS) ×3 IMPLANT
PAD ABD DERMACEA PRESS 5X9 (GAUZE/BANDAGES/DRESSINGS) ×3 IMPLANT
PULSAVAC PLUS IRRIG FAN TIP (DISPOSABLE) ×3
SLEEVE UNITRAX V40 (Orthopedic Implant) ×1 IMPLANT
SLEEVE UNITRAX V40 +8 (Orthopedic Implant) ×2 IMPLANT
SOL PREP PVP 2OZ (MISCELLANEOUS) ×3
SOLUTION PREP PVP 2OZ (MISCELLANEOUS) ×2 IMPLANT
SPONGE T-LAP 18X18 ~~LOC~~+RFID (SPONGE) ×12 IMPLANT
STAPLER SKIN PROX 35W (STAPLE) ×3 IMPLANT
STEM HIP 127 DEG (Stem) ×3 IMPLANT
SUT DVC 2 QUILL PDO  T11 36X36 (SUTURE) ×2
SUT DVC 2 QUILL PDO T11 36X36 (SUTURE) ×4 IMPLANT
SUT QUILL PDO 0 36 36 VIOLET (SUTURE) ×6 IMPLANT
SUT TICRON 2-0 30IN 311381 (SUTURE) ×12 IMPLANT
SYR 10ML LL (SYRINGE) ×6 IMPLANT
SYR 30ML LL (SYRINGE) ×3 IMPLANT
SYR 50ML LL SCALE MARK (SYRINGE) ×3 IMPLANT
TAPE MICROFOAM 4IN (TAPE) ×3 IMPLANT
TIP FAN IRRIG PULSAVAC PLUS (DISPOSABLE) ×2 IMPLANT
TUBE SUCT KAM VAC (TUBING) ×3 IMPLANT
WATER STERILE IRR 1000ML POUR (IV SOLUTION) ×3 IMPLANT
WATER STERILE IRR 500ML POUR (IV SOLUTION) ×3 IMPLANT

## 2021-08-26 NOTE — Progress Notes (Signed)
[facility]  ROUNDING NOTE   Subjective:   [name] was admitted to [facility] on 08/25/2021 for Closed left hip fracture [facility]) [S72.002A] Closed fracture of left hip, initial encounter ([place]) [S72.002A] Fall, initial encounter [W19.XXXA]  Last peritoneal treatment was Friday night   Wife at bedside.   Patient scheduled for hip surgery with Dr. Sabra Heck later today.   Objective:  Vital signs in last 24 hours:  Temp:  [97.6 F (36.4 C)-98.5 F (36.9 C)] 98.1 F (36.7 C) (10/09 1131) Pulse Rate:  [64-77] 64 (10/09 1131) Resp:  [14-20] 18 (10/09 1131) BP: (165-190)/(63-85) 166/63 (10/09 1131) SpO2:  [96 %-99 %] 97 % (10/09 1131) Weight:  [80 kg] 80 kg (10/08 2214)  Weight change:  Filed Weights   08/25/21 2214  Weight: 80 kg    Intake/Output: I/O last 3 completed shifts: In: 190 [I.V.:190] Out: -    Intake/Output this shift:  No intake/output data recorded.  Physical Exam: General: NAD, laying in bed  Head: Normocephalic, atraumatic. Moist oral mucosal membranes  Eyes: Anicteric, PERRL  Neck: Supple, trachea midline  Lungs:  Clear to auscultation  Heart: Regular rate and rhythm  Abdomen:  Soft, nontender,   Extremities:  no peripheral edema. Left lower extremity in traction.   Neurologic: Nonfocal, moving all four extremities  Skin: No lesions  Access: PD catheter, clean dressings    Basic Metabolic Panel: Recent Labs  Lab 08/25/21 2228 08/26/21 0453  NA 138 139  K 3.5 3.4*  CL 100 102  CO2 25 24  GLUCOSE 199* 192*  BUN 67* 69*  CREATININE 7.67* 7.53*  CALCIUM 7.9* 7.7*    Liver Function Tests: No results for input(s): AST, ALT, ALKPHOS, BILITOT, PROT, ALBUMIN in the last 168 hours. No results for input(s): LIPASE, AMYLASE in the last 168 hours. No results for input(s): AMMONIA in the last 168 hours.  CBC: Recent Labs  Lab 08/25/21 2228 08/26/21 0453  WBC 16.3* 14.7*  NEUTROABS 10.1*  --   HGB 11.0* 9.6*  HCT 32.9* 28.9*   MCV 92.7 93.5  PLT 270 221    Cardiac Enzymes: No results for input(s): CKTOTAL, CKMB, CKMBINDEX, TROPONINI in the last 168 hours.  BNP: Invalid input(s): POCBNP  CBG: Recent Labs  Lab 08/26/21 0408 08/26/21 0813 08/26/21 1132  GLUCAP 207* 164* 150*    Microbiology: Results for orders placed or performed during the hospital encounter of 08/25/21  Resp Panel by RT-PCR (Flu A&B, Covid) Nasopharyngeal Swab     Status: None   Collection Time: 08/25/21 11:10 PM   Specimen: Nasopharyngeal Swab; Nasopharyngeal(NP) swabs in vial transport medium  Result Value Ref Range Status   SARS Coronavirus 2 by RT PCR NEGATIVE NEGATIVE Final    Comment: (NOTE) SARS-CoV-2 target nucleic acids are NOT DETECTED.  The SARS-CoV-2 RNA is generally detectable in upper respiratory specimens during the acute phase of infection. The lowest concentration of SARS-CoV-2 viral copies this assay can detect is 138 copies/mL. A negative result does not preclude SARS-Cov-2 infection and should not be used as the sole basis for treatment or other patient management decisions. A negative result may occur with  improper specimen collection/handling, submission of specimen other than nasopharyngeal swab, presence of viral mutation(s) within the areas targeted by this assay, and inadequate number of viral copies(<138 copies/mL). A negative result must be combined with clinical observations, patient history, and epidemiological information. The expected result is Negative.  Fact Sheet for Patients:  EntrepreneurPulse.com.au  Fact Sheet  for Healthcare Providers:  IncredibleEmployment.be  This test is no t yet approved or cleared by the [place] and  has been authorized for detection and/or diagnosis of SARS-CoV-2 by FDA under an Emergency Use Authorization (EUA). This EUA will remain  in effect (meaning this test can be used) for the duration of the COVID-19  declaration under Section 564(b)(1) of the Act, 21 U.S.C.section 360bbb-3(b)(1), unless the authorization is terminated  or revoked sooner.       Influenza A by PCR NEGATIVE NEGATIVE Final   Influenza B by PCR NEGATIVE NEGATIVE Final    Comment: (NOTE) The Xpert Xpress SARS-CoV-2/FLU/RSV plus assay is intended as an aid in the diagnosis of influenza from Nasopharyngeal swab specimens and should not be used as a sole basis for treatment. Nasal washings and aspirates are unacceptable for Xpert Xpress SARS-CoV-2/FLU/RSV testing.  Fact Sheet for Patients: EntrepreneurPulse.com.au  Fact Sheet for Healthcare Providers: IncredibleEmployment.be  This test is not yet approved or cleared by the [place] FDA and has been authorized for detection and/or diagnosis of SARS-CoV-2 by FDA under an Emergency Use Authorization (EUA). This EUA will remain in effect (meaning this test can be used) for the duration of the COVID-19 declaration under Section 564(b)(1) of the Act, 21 U.S.C. section 360bbb-3(b)(1), unless the authorization is terminated or revoked.  Performed at [facility], [address], [place], [place] [address]   Surgical PCR screen     Status: None   Collection Time: 08/26/21  4:27 AM   Specimen: Nasal Mucosa; Nasal Swab  Result Value Ref Range Status   MRSA, PCR NEGATIVE NEGATIVE Final   Staphylococcus aureus NEGATIVE NEGATIVE Final    Comment: (NOTE) The Xpert SA Assay (FDA approved for NASAL specimens in patients 42 years of age and older), is one component of a comprehensive surveillance program. It is not intended to diagnose infection nor to guide or monitor treatment. Performed at [facility], [address] [place],  [address]     Coagulation Studies: Recent Labs    08/25/21 02/05/2227  LABPROT 13.7  INR 1.1    Urinalysis: No results for input(s): COLORURINE, LABSPEC, PHURINE, GLUCOSEU,  HGBUR, BILIRUBINUR, KETONESUR, PROTEINUR, UROBILINOGEN, NITRITE, LEUKOCYTESUR in the last 72 hours.  Invalid input(s): APPERANCEUR    Imaging: DG Chest Portable 1 View  Result Date: 08/25/2021 CLINICAL DATA:  No loc, slipped in shower, no hit head, no thinners, co of left leg pain, htn , swelling to left leg EXAM: PORTABLE CHEST 1 VIEW COMPARISON:  Chest x-ray 06/04/2019 FINDINGS: The heart and mediastinal contours are within normal limits. Aortic valve replacement. Atrial appendage clip. Aortic calcification. Approximately 4 cm right paramediastinal upper lung airspace opacity. No pulmonary edema. At least trace to small volume left pleural effusion. No right pleural effusion. No pneumothorax. No acute osseous abnormality.Intact appearing sternotomy wires. IMPRESSION: 1. Approximately 4 cm right paramediastinal upper lung airspace opacity. 2. At least trace to small volume left pleural effusion. 3. Recommend CT chest with intravenous contrast (not CTPA) for further evaluation. Electronically Signed   By: Iven Finn M.D.   On: 08/25/2021 23:32   DG Femur Min 2 Views Left  Result Date: 08/25/2021 CLINICAL DATA:  Pain after slip and fall injury. EXAM: LEFT FEMUR 2 VIEWS COMPARISON:  11/22/2020 FINDINGS: Acute transverse fracture of the left femoral neck with varus angulation of the fracture fragments. No dislocation at the hip joint. Visualized left hemipelvis appears intact. Midshaft and distal femur is intact. No dislocation  at the knee joint. Extensive vascular calcifications in the soft tissues. Catheter coiled in the pelvis likely represents a peritoneal dialysis catheter. IMPRESSION: Acute transverse fracture of the left femoral neck with varus angulation of the fracture fragments. Electronically Signed   By: Lucienne Capers M.D.   On: 08/25/2021 23:02     Medications:    sodium chloride      ceFAZolin (ANCEF) IV     clindamycin (CLEOCIN) IV      amLODipine  5 mg Oral Daily    furosemide  120 mg Oral Daily   hydrALAZINE  25 mg Oral BID   insulin aspart  0-5 Units Subcutaneous QHS   insulin aspart  0-9 Units Subcutaneous TID WC   levothyroxine  100 mcg Oral Q0600   losartan  100 mg Oral Daily   magnesium oxide  400 mg Oral Daily   metoprolol tartrate  25 mg Oral BID   multivitamin  1 tablet Oral Daily   mupirocin ointment  1 application Nasal BID   pantoprazole  40 mg Oral BID   rosuvastatin  10 mg Oral Daily   sevelamer carbonate  800 mg Oral BID WC   sodium chloride flush  3 mL Intravenous Q12H   tamsulosin  0.4 mg Oral Daily   vitamin B-12  5,000 mcg Oral Daily   sodium chloride, acetaminophen **OR** acetaminophen, albuterol, magnesium hydroxide, morphine injection, ondansetron **OR** ondansetron (ZOFRAN) IV, sodium chloride flush, traZODone  Assessment/ Plan:  Mr. ANTONY SIAN is a 81 y.o. white male with end stage renal disease on peritoneal dialysis, hypertension, diabetes mellitus type II, hypothyroidism, GERD, hyperlipidemia, BPH, congestive heart failure, asthma who is admitted to [facility] on 08/25/2021 for Closed left hip fracture ([place]) [S72.002A] Closed fracture of left hip, initial encounter ([place]) [S72.002A] Fall, initial encounter [W19.XXXA]  [facility] CCPD 8 hours 4 exchanges 2037mL fills   End Stage Renal Disease: on peritoneal dialysis. Last treatment was Friday night. Wife and patient report no issues on peritoneal dialysis. Tolerating treatments well at home.  - Plan on transitioning patient to intermittent in center hemodialysis while patient is in rehab.  - Consult vascular for tunneled catheter placement.   Hypertension: elevated at 166/63. Home regimen of furosemide, losartan, metoprolol, amlodipine and tamsulosin. Medications given this morning.   Anemia of chronic kidney disease: hemoglobin 9.6. normocytic. ESA as outpatient.   Secondary Hyperparathyroidism: with hyperphosphatemia on 9/9, phos of 6.3.  Corrected calcium and PTH at goal.  - sevelamer with meals.   Disposition: transition to in center hemodialysis. Will need three treatments.    LOS: 1 Benancio Osmundson 10/9/202212:09 PM

## 2021-08-26 NOTE — Anesthesia Procedure Notes (Signed)
Spinal  Patient location during procedure: OR Start time: 08/26/2021 3:34 PM Reason for block: surgical anesthesia Staffing Performed: anesthesiologist  Anesthesiologist: Harrie Foreman, MD Preanesthetic Checklist Completed: patient identified, IV checked, site marked, risks and benefits discussed, surgical consent, monitors and equipment checked, pre-op evaluation and timeout performed Spinal Block Patient position: left lateral decubitus Prep: ChloraPrep Patient monitoring: heart rate, cardiac monitor, continuous pulse ox and blood pressure Approach: midline Location: L3-4 Injection technique: single-shot Needle Needle type: Sprotte  Needle gauge: 24 G Needle length: 9 cm Assessment Sensory level: T4 Events: CSF return Additional Notes Patient was given propofol sedation and placed in LLD position. Spinal placed easily, with epinephrine wash (6mL 0.5% bupiv).

## 2021-08-26 NOTE — Op Note (Signed)
08/26/2021  5:50 PM  PATIENT:  Manuel Klein   MRN: [id number]  PRE-OPERATIVE DIAGNOSIS:  Displaced Subcapital fracture left hip   POST-OPERATIVE DIAGNOSIS: Same  PROCEDURE: Left   hip hemiarthroplasty with Stryker Accolade prosthesis  PREOPERATIVE INDICATIONS:  Manuel Klein is an 80 y.o. male who was admitted 08/25/2021 with a diagnosis of displaced subcapital fracture of the hip and elected for surgical management.  The risks benefits and alternatives were discussed with the patient including but not limited to the risks of nonoperative treatment, versus surgical intervention including infection, bleeding, nerve injury, periprosthetic fracture, the need for revision surgery, dislocation, leg length discrepancy, blood clots, cardiopulmonary complications, morbidity, mortality, among others, and they were willing to proceed.  Predicted outcome is good, although there will be at least a six to nine month expected recovery.     SURGEON:  Howard Miller, MD  ASST:    ANESTHESIA: Spinal    COMPLICATIONS:  None.   EBL: 50 cc    COMPONENTS:  Stryker Accolade Femoral Fracture stem size #550,   and a size   2 mm  fracture head unipolar hip ball with    +8 mm  neck length.    PROCEDURE IN DETAIL: The patient was met in the holding area and identified.  The appropriate hip  was marked at the operative site. The patient was then transported to the OR and  placed under general anesthesia.  At that point, the patient was  placed in the lateral decubitus position with the operative side up and  secured to the operating room table and all bony prominences padded.     The operative lower extremity was prepped from the iliac crest to the toes.  Sterile draping was performed.  Time out was performed prior to incision.      A routine posterolateral approach was utilized via sharp dissection  carried down to the subcutaneous tissue.  Gross bleeders were Bovie  coagulated.  The iliotibial band was  identified and incised  along the length of the skin incision.  Self-retaining retractors were  inserted.  With the hip internally rotated, the short external rotators  were identified. The piriformis was tagged and the hip capsule released in a T-type fashion.  The femoral neck was exposed, and I resected the femoral neck using the appropriate jig.  The fracture went down almost to the lesser trochanter.  The femoral head was then removed and sized.  I then exposed the deep acetabulum, cleared out any tissue including the ligamentum teres.    I then prepared the proximal femur using the cookie-cutter, the lateralizing reamer, and then sequentially broached.  A trial stem   was  utilized along with a unipolar head and neck.  I reduced the hip and it was found to have excellent stability with functional range of motion. Leg lengths were equal.  The trial components were then removed.   The same size Accolade femoral stem was then inserted and was very stable.  The Unitrax head and neck as trialed were inserted as well.     The hip was then reduced and taken through functional range of motion and found to have excellent stability. Leg lengths were restored.     I closed the T in the capsule with #2 Ticron as well as the short external rotators. A hemovac was inserted.    I then irrigated the hip copiously again with pulse lavage, and repaired the fascia with #2 Quill and the subcutaneous   layer with #0 Quill. Sponge and needle counts were correct. Dry sterile Aquacell was applied.   The patient was then awakened and returned to PACU in stable and satisfactory condition. There were no complications.  Park Breed, MD Orthopedic Surgeon [phone]   08/26/2021 5:50 PM

## 2021-08-26 NOTE — H&P (Signed)
[place]   PATIENT NAME: Manuel Klein    MR#:  [id number]  DATE OF BIRTH:  1941/01/27  DATE OF ADMISSION:  08/25/2021  PRIMARY CARE PHYSICIAN: Lynnell Jude, MD   Patient is coming from: Home  REQUESTING/REFERRING PHYSICIAN: Nance Pear, MD  CHIEF COMPLAINT:   Chief Complaint  Patient presents with   Fall    No loc, slipped in shower, no hit head, no thinners, co of left leg pain     HISTORY OF PRESENT ILLNESS:  ATIF CHAPPLE is a 80 y.o. Caucasian male with medical history significant for end-stage renal disease on peritoneal dialysis, CHF, coronary artery disease, type diabetes mellitus, hypertension, dyslipidemia, GERD and asthma, who presented to the ER with acute onset of accidental mechanical fall on his left side, slipping in the shower with subsequent left hip pain and inability to get up.  The patient denied any presyncope or syncope.  No headache or dizziness or blurred vision.  No paresthesias or focal muscle weakness.  No chest pain or palpitations.  No cough or wheezing hemoptysis.  No bleeding diathesis.  No dysuria, hematuria or urgency or frequency or flank pain.  He has been compliant with PD for ESRD.  ED Course: When he came to the ER, blood pressure was 173/83 with otherwise normal vital signs.  Labs revealed borderline potassium of 3.5, blood glucose 199, BUN of 67 and creatinine 7.67 and CBC showed To 16.3 with neutrophilia.  Influenza antigens and COVID-19 PCR came back negative. EKG as reviewed by me : Showed normal sinus rhythm with rate of 72 with right bundle branch block Imaging: Left femur x-ray showed acute transverse fracture of the left femoral neck with varus angulation of the fracture fragments.  Portable chest ray showed the following: 1. Approximately 4 cm right paramediastinal upper lung airspace opacity. 2. At least trace to small volume left pleural effusion. 3. Recommend CT chest with intravenous contrast for further  evaluation.  The patient was given hydration with IV normal saline and 2 mg of IV morphine sulfate.  Dr. Sabra Heck was notified about the patient.  He will be admitted to a med-surgical bed for further evaluation and management.  PAST MEDICAL HISTORY:   Past Medical History:  Diagnosis Date   Asthma    CHF (congestive heart failure) ([place])    Chronic kidney disease    stage IV   Coronary artery disease    Diabetes mellitus without complication (HCC)    GERD (gastroesophageal reflux disease)    Hyperlipidemia    Hypertension    Myocardial infarction ([place])    Seizures ([place])    none since 20"s    PAST SURGICAL HISTORY:   Past Surgical History:  Procedure Laterality Date   CAPD INSERTION N/A 11/07/2017   Procedure: LAPAROSCOPIC INSERTION CONTINUOUS AMBULATORY PERITONEAL DIALYSIS  (CAPD) CATHETER;  Surgeon: Katha Cabal, MD;  Location: [facility] ORS;  Service: Vascular;  Laterality: N/A;   CARDIAC SURGERY     stent placement   CATARACT EXTRACTION, BILATERAL     COLONOSCOPY     CORONARY ANGIOPLASTY     ESOPHAGOGASTRODUODENOSCOPY (EGD) WITH PROPOFOL N/A 05/16/2021   Procedure: ESOPHAGOGASTRODUODENOSCOPY (EGD) WITH PROPOFOL;  Surgeon: Jonathon Bellows, MD;  Location: [facility] ENDOSCOPY;  Service: Gastroenterology;  Laterality: N/A;   GIVENS CAPSULE STUDY N/A 05/18/2021   Procedure: GIVENS CAPSULE STUDY;  Surgeon: Jonathon Bellows, MD;  Location: [facility] ENDOSCOPY;  Service: Gastroenterology;  Laterality: N/A;   RETINAL DETACHMENT SURGERY  SOCIAL HISTORY:   Social History   Tobacco Use   Smoking status: Light Smoker   Smokeless tobacco: Never  Substance Use Topics   Alcohol use: No    FAMILY HISTORY:   Family History  Problem Relation Age of Onset   Heart disease Mother    Diabetes Mother    Diabetes Father     DRUG ALLERGIES:   Allergies  Allergen Reactions   Hydralazine     Other reaction(s): Dizziness, Muscle Pain Chest pain   Atorvastatin     Other reaction(s): Muscle Pain    Carvedilol Other (See Comments)    Dizziness, drunk feeling   Ciprofloxacin Rash   Other Rash   Penicillins Rash and Other (See Comments)    Has patient had a PCN reaction causing immediate rash, facial/tongue/throat swelling, SOB or lightheadedness with hypotension: No Has patient had a PCN reaction causing severe rash involving mucus membranes or skin necrosis: No Has patient had a PCN reaction that required hospitalization: No Has patient had a PCN reaction occurring within the last 10 years: No If all of the above answers are "NO", then may proceed with Cephalosporin use.    Sulfa Antibiotics Rash   Sulfasalazine Rash    REVIEW OF SYSTEMS:   ROS As per history of present illness. All pertinent systems were reviewed above. Constitutional, HEENT, cardiovascular, respiratory, GI, GU, musculoskeletal, neuro, psychiatric, endocrine, integumentary and hematologic systems were reviewed and are otherwise negative/unremarkable except for positive findings mentioned above in the HPI.   MEDICATIONS AT HOME:   Prior to Admission medications   Medication Sig Start Date End Date Taking? Authorizing Provider  amLODipine (NORVASC) 5 MG tablet Take 5 mg by mouth daily.    [provider]  aspirin 81 MG tablet Take 81 mg by mouth daily.  11/30/08   [provider]  Cyanocobalamin 5000 MCG CAPS Take 5,000 Units by mouth daily.    [provider]  furosemide (LASIX) 40 MG tablet Take 120 mg by mouth daily.    [provider]  hydrALAZINE (APRESOLINE) 25 MG tablet Take 25 mg by mouth 2 (two) times daily. 02/06/21   [provider]  insulin lispro (HUMALOG) 100 UNIT/ML KiwkPen Inject 6-10 Units into the skin 3 (three) times daily with meals.    [provider]  levothyroxine (SYNTHROID) 100 MCG tablet Take 100 mcg by mouth daily. 04/25/21   [provider]  losartan (COZAAR) 100 MG tablet Take 100 mg by mouth daily.    [provider]  magnesium oxide (MAG-OX) 400 MG tablet Take 400 mg by mouth daily.    [provider]  metoprolol tartrate (LOPRESSOR) 25 MG tablet Take 25 mg by mouth 2 (two) times daily. 04/25/21   [provider]  multivitamin (RENA-VIT) TABS tablet Take 1 tablet by mouth daily.    [provider]  pantoprazole (PROTONIX) 40 MG tablet Take 1 tablet (40 mg total) by mouth 2 (two) times daily. 05/19/21 06/18/21  Terrilee Croak, MD  rosuvastatin (CRESTOR) 10 MG tablet Take 10 mg by mouth daily.    [provider]  sevelamer carbonate (RENVELA) 800 MG tablet Take 800 mg by mouth 2 (two) times daily.    [provider]  tamsulosin (FLOMAX) 0.4 MG CAPS capsule Take 0.4 mg by mouth daily.    [provider]  VENTOLIN HFA 108 (90 Base) MCG/ACT inhaler Inhale 2 puffs into the lungs every 4 (four) hours as needed for wheezing or shortness  of breath.    [provider]      VITAL SIGNS:  Blood pressure (!) 165/72, pulse 69, temperature 98.5 F (36.9 C), temperature source Oral, resp. rate 14, height 5\' 9"  (1.753 m), weight 80 kg, SpO2 96 %.  PHYSICAL EXAMINATION:  Physical Exam  GENERAL:  80 y.o.-year-old patient lying in the bed with no acute distress.  EYES: Pupils equal, round, reactive to light and accommodation. No scleral icterus. Extraocular muscles intact.  HEENT: Head atraumatic, normocephalic. Oropharynx and nasopharynx clear.  NECK:  Supple, no jugular venous distention. No thyroid enlargement, no tenderness.  LUNGS: Normal breath sounds bilaterally, no wheezing, rales,rhonchi or crepitation. No use of accessory muscles of respiration.  CARDIOVASCULAR: Regular rate and rhythm, S1, S2 normal. No murmurs, rubs, or gallops.  ABDOMEN: Soft, nondistended, nontender. Bowel sounds present. No organomegaly or mass.  EXTREMITIES: No pedal edema, cyanosis, or clubbing. Musculoskeletal: Left lateral hip tenderness. NEUROLOGIC: Cranial nerves II through  XII are intact. Muscle strength 5/5 in all extremities. Sensation intact. Gait not checked.  PSYCHIATRIC: The patient is alert and oriented x 3.  Normal affect and good eye contact. SKIN: No obvious rash, lesion, or ulcer.   LABORATORY PANEL:   CBC Recent Labs  Lab 08/25/21 2228  WBC 16.3*  HGB 11.0*  HCT 32.9*  PLT 270   ------------------------------------------------------------------------------------------------------------------  Chemistries  Recent Labs  Lab 08/25/21 2228  NA 138  K 3.5  CL 100  CO2 25  GLUCOSE 199*  BUN 67*  CREATININE 7.67*  CALCIUM 7.9*   ------------------------------------------------------------------------------------------------------------------  Cardiac Enzymes No results for input(s): TROPONINI in the last 168 hours. ------------------------------------------------------------------------------------------------------------------  RADIOLOGY:  DG Chest Portable 1 View  Result Date: 08/25/2021 CLINICAL DATA:  No loc, slipped in shower, no hit head, no thinners, co of left leg pain, htn , swelling to left leg EXAM: PORTABLE CHEST 1 VIEW COMPARISON:  Chest x-ray 06/04/2019 FINDINGS: The heart and mediastinal contours are within normal limits. Aortic valve replacement. Atrial appendage clip. Aortic calcification. Approximately 4 cm right paramediastinal upper lung airspace opacity. No pulmonary edema. At least trace to small volume left pleural effusion. No right pleural effusion. No pneumothorax. No acute osseous abnormality.Intact appearing sternotomy wires. IMPRESSION: 1. Approximately 4 cm right paramediastinal upper lung airspace opacity. 2. At least trace to small volume left pleural effusion. 3. Recommend CT chest with intravenous contrast (not CTPA) for further evaluation. Electronically Signed   By: Iven Finn M.D.   On: 08/25/2021 23:32   DG Femur Min 2 Views Left  Result Date: 08/25/2021 CLINICAL DATA:  Pain after slip and fall  injury. EXAM: LEFT FEMUR 2 VIEWS COMPARISON:  11/22/2020 FINDINGS: Acute transverse fracture of the left femoral neck with varus angulation of the fracture fragments. No dislocation at the hip joint. Visualized left hemipelvis appears intact. Midshaft and distal femur is intact. No dislocation at the knee joint. Extensive vascular calcifications in the soft tissues. Catheter coiled in the pelvis likely represents a peritoneal dialysis catheter. IMPRESSION: Acute transverse fracture of the left femoral neck with varus angulation of the fracture fragments. Electronically Signed   By: Lucienne Capers M.D.   On: 08/25/2021 23:02      IMPRESSION AND PLAN:  Active Problems:   Closed left hip fracture (HCC)  1.  Closed left hip/femoral neck fracture secondary to mechanical fall. - The patient will be admitted to a medical-surgical monitor bed. - Pain management to be provided. - The patient will be kept n.p.o. after midnight. -  Aspirin will be held off. - Orthopedic consultation will be obtained. - Dr. Sabra Heck was notified about the patient.  2.  End-stage renal disease on peritoneal dialysis. - Nephrology consultation will be obtained for follow-up. - I notified Dr. Juleen China about the patient.  3.  Essential hypertension. - We will continue his antihypertensives dyslipidemia.  4.  Type 2 diabetes mellitus. - The patient will be placed on supplement coverage with NovoLog.   5.  BPH. - We will continue Flomax.  6.  Hypothyroidism. - We will continue Synthroid.  7.  Dyslipidemia. - We will continue statin therapy.  8.  GERD. - We will continue PPI therapy.  9.  Asthma without exacerbation. - We will continue his as needed albuterol.  DVT prophylaxis: SCDs.  Medical prophylaxis is postponed till postoperative period Code Status: This was discussed with the patient and his wife.  He is DNR/DNI. Family Communication:  The plan of care was discussed in details with the patient (and  family). I answered all questions. The patient agreed to proceed with the above mentioned plan. Further management will depend upon hospital course. Disposition Plan: Back to previous home environment Consults called: Orthopedic consult.   All the records are reviewed and case discussed with ED provider.  Status is: Inpatient  Remains inpatient appropriate because:Ongoing diagnostic testing needed not appropriate for outpatient work up, Unsafe d/c plan, IV treatments appropriate due to intensity of illness or inability to take PO, and Inpatient level of care appropriate due to severity of illness  Dispo: The patient is from: Home              Anticipated d/c is to: SNF              Patient currently is not medically stable to d/c.   Difficult to place patient No  - TOTAL TIME TAKING CARE OF THIS PATIENT: 55 minutes.    Christel Mormon M.D on 08/26/2021 at 12:23 AM  [facility]   From 7 PM-7 AM, contact night-coverage [email or web address]  CC: Primary care physician; Lynnell Jude, MD

## 2021-08-26 NOTE — TOC Initial Note (Signed)
Transition of Care [facility]) - Initial/Assessment Note    Patient Details  Name: Manuel Klein MRN: [id number] Date of Birth: Jun 27, 1941  [facility] of [facility]) CM/SW Contact:    Candie Chroman, LCSW Phone Number: 08/26/2021, 12:03 PM  Clinical Narrative:   Readmission prevention screen complete. CSW met with patient. Wife at bedside. CSW introduced role and explained that discharge planning would be discussed. PCP is Lavera Guise, MD in [place]. Wife drives him to appointments. Pharmacy is [facility] in [place]. No issues obtaining medications. Patient active with [facility] for home health services. [facility] representative will find out what services he is active with. Wife said PT. Patient uses a 4-prong cane at home. No oxygen at home. Currently on 2 L. Wife asked about SNF. Explained that we are unable to place patients on PD in SNF but in the past patients have switched to HD in order to go to rehab. Wife does not think they would want to switch. Nephrologist aware. No further concerns. CSW encouraged patient and his wife to contact CSW as needed. CSW will continue to follow patient and his wife for support and facilitate return home when stable.               Expected Discharge Plan: [place] Barriers to Discharge: Continued Medical Work up   Patient Goals and CMS Choice     Choice offered to / list presented to : NA  Expected Discharge Plan and Services Expected Discharge Plan: [place] Acute Care Choice: Resumption of Svcs/PTA Provider Living arrangements for the past 2 months: [place] Agency: [place] Date [place]: 08/26/21   Representative spoke with at [place]: Sharmon Revere  Prior Living Arrangements/Services Living arrangements for the past 2 months: [place] Lives with:: Spouse Patient language and need for interpreter reviewed::  Yes Do you feel safe going back to the place where you live?: Yes      Need for Family Participation in Patient Care: Yes (Comment) Care giver support system in place?: Yes (comment) Current home services: DME, Home PT Criminal Activity/Legal Involvement Pertinent to Current Situation/Hospitalization: No - Comment as needed  Activities of Daily Living Home Assistive Devices/Equipment: Cane (specify quad or straight), Grab bars in shower ADL Screening (condition at time of admission) Patient's cognitive ability adequate to safely complete daily activities?: Yes Is the patient deaf or have difficulty hearing?: Yes Does the patient have difficulty seeing, even when wearing glasses/contacts?: Yes Does the patient have difficulty concentrating, remembering, or making decisions?: Yes Patient able to express need for assistance with ADLs?: Yes Does the patient have difficulty dressing or bathing?: Yes Independently performs ADLs?: No Communication: Independent Dressing (OT): Needs assistance Does the patient have difficulty walking or climbing stairs?: Yes Weakness of Legs: Left Weakness of Arms/Hands: None  Permission Sought/Granted Permission sought to share information with : Facility Sport and exercise psychologist, Family Supports Permission granted to share information with : Yes, Verbal Permission Granted  Share Information with NAME: Aniken Monestime  Permission granted to share info w AGENCY: [place] granted to share info w Relationship: Wife  Permission granted to share info w Contact Information: [phone]  Emotional Assessment Appearance:: Appears stated age Attitude/Demeanor/Rapport: Engaged, Gracious, Lethargic Affect (  typically observed): Accepting, Appropriate, Calm, Pleasant Orientation: : Oriented to Self, Oriented to Place, Oriented to  Time, Oriented to Situation Alcohol / Substance Use: Not Applicable Psych Involvement: No (comment)  Admission  diagnosis:  Closed left hip fracture ([place]) [S72.002A] Closed fracture of left hip, initial encounter ([place]) [S72.002A] Fall, initial encounter [W19.XXXA] Patient Active Problem List   Diagnosis Date Noted   Closed left hip fracture ([place]) 08/25/2021   Protein-calorie malnutrition, severe 05/15/2021   Hypokalemia    ESRD (end stage renal disease) (HCC)    Chronic diastolic CHF (congestive heart failure) (HCC)    Absolute anemia 05/14/2021   Coronary artery disease    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[place]) 05/11/2021   Symptomatic anemia 05/11/2021   Hyperlipidemia 11/03/2017   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[place]) 11/03/2017   Essential hypertension 11/03/2017   PCP:  Lynnell Jude, MD Pharmacy:   [facility] [place], [place] [facility] [address] [place] [facility] [place] [address] Phone: [phone] Fax: [phone]     Social Determinants of Health (SDOH) Interventions    Readmission Risk Interventions Readmission Risk Prevention Plan 08/26/2021  Transportation Screening Complete  PCP or Specialist Appt within 3-5 Days Complete  HRI or [place] Complete  Social Work Consult for [place] Planning/Counseling Complete  Palliative Care Screening Not Applicable  Medication Review Press photographer) Complete  Some recent data might be hidden

## 2021-08-26 NOTE — H&P (View-Only) (Signed)
Reason for Consult:Permacath dialysis catheter placement. Referring Physician: Fritzi Mandes, MD  Manuel Klein is an 80 y.o. male.  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  He will need rehabilitation after his left hip hemiarthroplasty and will need HD for back up.   Past Medical History:  Diagnosis Date   Asthma    CHF (congestive heart failure) (HCC)    Chronic kidney disease    stage IV   Coronary artery disease    Diabetes mellitus without complication (HCC)    GERD (gastroesophageal reflux disease)    Hyperlipidemia    Hypertension    Myocardial infarction ([place])    Seizures ([place])    none since 20"s    Past Surgical History:  Procedure Laterality Date   CAPD INSERTION N/A 11/07/2017   Procedure: LAPAROSCOPIC INSERTION CONTINUOUS AMBULATORY PERITONEAL DIALYSIS  (CAPD) CATHETER;  Surgeon: Katha Cabal, MD;  Location: [facility] ORS;  Service: Vascular;  Laterality: N/A;   CARDIAC SURGERY     stent placement   CATARACT EXTRACTION, BILATERAL     COLONOSCOPY     CORONARY ANGIOPLASTY     ESOPHAGOGASTRODUODENOSCOPY (EGD) WITH PROPOFOL N/A 05/16/2021   Procedure: ESOPHAGOGASTRODUODENOSCOPY (EGD) WITH PROPOFOL;  Surgeon: Jonathon Bellows, MD;  Location: [facility] ENDOSCOPY;  Service: Gastroenterology;  Laterality: N/A;   GIVENS CAPSULE STUDY N/A 05/18/2021   Procedure: GIVENS CAPSULE STUDY;  Surgeon: Jonathon Bellows, MD;  Location: [facility] ENDOSCOPY;  Service: Gastroenterology;  Laterality: N/A;   RETINAL DETACHMENT SURGERY      Family History  Problem Relation Age of Onset   Heart disease Mother    Diabetes Mother    Diabetes Father     Social History:  reports that he has been smoking. He has never used smokeless tobacco. He reports that he does not drink alcohol and does not use drugs.  Allergies:  Allergies  Allergen Reactions   Hydralazine     Other reaction(s): Dizziness, Muscle Pain Chest pain   Atorvastatin     Other reaction(s): Muscle Pain    Carvedilol Other (See Comments)    Dizziness, drunk feeling   Ciprofloxacin Rash   Other Rash   Penicillins Rash and Other (See Comments)    Has patient had a PCN reaction causing immediate rash, facial/tongue/throat swelling, SOB or lightheadedness with hypotension: No Has patient had a PCN reaction causing severe rash involving mucus membranes or skin necrosis: No Has patient had a PCN reaction that required hospitalization: No Has patient had a PCN reaction occurring within the last 10 years: No If all of the above answers are "NO", then may proceed with Cephalosporin use.    Sulfa Antibiotics Rash   Sulfasalazine Rash    Medications: I have reviewed the patient's current medications.  Results for orders placed or performed during the hospital encounter of 08/25/21 (from the past 48 hour(s))  CBC with Differential     Status: Abnormal   Collection Time: 08/25/21 10:28 PM  Result Value Ref Range   WBC 16.3 (H) 4.0 - 10.5 K/uL   RBC 3.55 (L) 4.22 - 5.81 MIL/uL   Hemoglobin 11.0 (L) 13.0 - 17.0 g/dL   HCT 32.9 (L) 39.0 - 52.0 %   MCV 92.7 80.0 - 100.0 fL   MCH 31.0 26.0 - 34.0 pg   MCHC 33.4 30.0 - 36.0 g/dL   RDW 15.7 (H) 11.5 - 15.5 %   Platelets 270 150 - 400 K/uL   nRBC 0.0 0.0 -  0.2 %   Neutrophils Relative % 62 %   Neutro Abs 10.1 (H) 1.7 - 7.7 K/uL   Band Neutrophils 0 %   Lymphocytes Relative 7 %   Lymphs Abs 1.2 0.7 - 4.0 K/uL   Monocytes Relative 7 %   Monocytes Absolute 1.1 (H) 0.1 - 1.0 K/uL   Eosinophils Relative 23 %   Eosinophils Absolute 3.7 (H) 0.0 - 0.5 K/uL   Basophils Relative 0 %   Basophils Absolute 0.1 0.0 - 0.1 K/uL   WBC Morphology MORPHOLOGY UNREMARKABLE    RBC Morphology MORPHOLOGY UNREMARKABLE    Smear Review Normal platelet morphology    Other 0 %   nRBC 0 0 /100 WBC   Metamyelocytes Relative 0 %   Myelocytes 0 %   Promyelocytes Relative 0 %   Blasts 0 %   Immature Granulocytes 0 %   Abs Immature Granulocytes 0.12 (H) 0.00 - 0.07 K/uL     Comment: Performed at [facility], [address], [place], [place] [address]  Basic metabolic panel     Status: Abnormal   Collection Time: 08/25/21 10:28 PM  Result Value Ref Range   Sodium 138 135 - 145 mmol/L   Potassium 3.5 3.5 - 5.1 mmol/L   Chloride 100 98 - 111 mmol/L   CO2 25 22 - 32 mmol/L   Glucose, Bld 199 (H) 70 - 99 mg/dL    Comment: Glucose reference range applies only to samples taken after fasting for at least 8 hours.   BUN 67 (H) 8 - 23 mg/dL   Creatinine, Ser 7.67 (H) 0.61 - 1.24 mg/dL   Calcium 7.9 (L) 8.9 - 10.3 mg/dL   GFR, Estimated 7 (L) >60 mL/min    Comment: (NOTE) Calculated using the CKD-EPI Creatinine Equation (2021)    Anion gap 13 5 - 15    Comment: Performed at [facility], [address] [place], [place] [address]  Protime-INR     Status: None   Collection Time: 08/25/21 10:28 PM  Result Value Ref Range   Prothrombin Time 13.7 11.4 - 15.2 seconds   INR 1.1 0.8 - 1.2    Comment: (NOTE) INR goal varies based on device and disease states. Performed at [facility], [address] [place], [place] [address]   Resp Panel by RT-PCR (Flu A&B, Covid) Nasopharyngeal Swab     Status: None   Collection Time: 08/25/21 11:10 PM   Specimen: Nasopharyngeal Swab; Nasopharyngeal(NP) swabs in vial transport medium  Result Value Ref Range   SARS Coronavirus 2 by RT PCR NEGATIVE NEGATIVE    Comment: (NOTE) SARS-CoV-2 target nucleic acids are NOT DETECTED.  The SARS-CoV-2 RNA is generally detectable in upper respiratory specimens during the acute phase of infection. The lowest concentration of SARS-CoV-2 viral copies this assay can detect is 138 copies/mL. A negative result does not preclude SARS-Cov-2 infection and should not be used as the sole basis for treatment or other patient management decisions. A negative result may occur with  improper specimen collection/handling, submission of specimen other than  nasopharyngeal swab, presence of viral mutation(s) within the areas targeted by this assay, and inadequate number of viral copies(<138 copies/mL). A negative result must be combined with clinical observations, patient history, and epidemiological information. The expected result is Negative.  Fact Sheet for Patients:  EntrepreneurPulse.com.au  Fact Sheet for Healthcare Providers:  IncredibleEmployment.be  This test is no t yet approved or cleared by the [place] FDA and  has  been authorized for detection and/or diagnosis of SARS-CoV-2 by FDA under an Emergency Use Authorization (EUA). This EUA will remain  in effect (meaning this test can be used) for the duration of the COVID-19 declaration under Section 564(b)(1) of the Act, 21 U.S.C.section 360bbb-3(b)(1), unless the authorization is terminated  or revoked sooner.       Influenza A by PCR NEGATIVE NEGATIVE   Influenza B by PCR NEGATIVE NEGATIVE    Comment: (NOTE) The Xpert Xpress SARS-CoV-2/FLU/RSV plus assay is intended as an aid in the diagnosis of influenza from Nasopharyngeal swab specimens and should not be used as a sole basis for treatment. Nasal washings and aspirates are unacceptable for Xpert Xpress SARS-CoV-2/FLU/RSV testing.  Fact Sheet for Patients: EntrepreneurPulse.com.au  Fact Sheet for Healthcare Providers: IncredibleEmployment.be  This test is not yet approved or cleared by the [place] FDA and has been authorized for detection and/or diagnosis of SARS-CoV-2 by FDA under an Emergency Use Authorization (EUA). This EUA will remain in effect (meaning this test can be used) for the duration of the COVID-19 declaration under Section 564(b)(1) of the Act, 21 U.S.C. section 360bbb-3(b)(1), unless the authorization is terminated or revoked.  Performed at [facility], [address] [place], [place] [address]   Type  and screen     Status: None (Preliminary result)   Collection Time: 08/26/21  1:25 AM  Result Value Ref Range   ABO/RH(D) A POS    Antibody Screen NEG    Sample Expiration      08/29/2021,2359 Performed at [facility], [address], [place], [place] [address]    Unit Number [id number]    Blood Component Type RED CELLS,LR    Unit division 00    Status of Unit ALLOCATED    Transfusion Status OK TO TRANSFUSE    Crossmatch Result Compatible    Unit Number [id number]    Blood Component Type RED CELLS,LR    Unit division 00    Status of Unit ALLOCATED    Transfusion Status OK TO TRANSFUSE    Crossmatch Result Compatible   Glucose, capillary     Status: Abnormal   Collection Time: 08/26/21  4:08 AM  Result Value Ref Range   Glucose-Capillary 207 (H) 70 - 99 mg/dL    Comment: Glucose reference range applies only to samples taken after fasting for at least 8 hours.   Comment 1 Notify RN    Comment 2 Document in Chart   Surgical PCR screen     Status: None   Collection Time: 08/26/21  4:27 AM   Specimen: Nasal Mucosa; Nasal Swab  Result Value Ref Range   MRSA, PCR NEGATIVE NEGATIVE   Staphylococcus aureus NEGATIVE NEGATIVE    Comment: (NOTE) The Xpert SA Assay (FDA approved for NASAL specimens in patients 53 years of age and older), is one component of a comprehensive surveillance program. It is not intended to diagnose infection nor to guide or monitor treatment. Performed at [facility], [address] [place], [place] [address]   Basic metabolic panel     Status: Abnormal   Collection Time: 08/26/21  4:53 AM  Result Value Ref Range   Sodium 139 135 - 145 mmol/L   Potassium 3.4 (L) 3.5 - 5.1 mmol/L   Chloride 102 98 - 111 mmol/L   CO2 24 22 - 32 mmol/L   Glucose, Bld 192 (H) 70 - 99 mg/dL    Comment: Glucose reference range applies only to samples taken  after fasting for at least 8 hours.   BUN 69 (H) 8 - 23 mg/dL   Creatinine, Ser 7.53 (H)  0.61 - 1.24 mg/dL   Calcium 7.7 (L) 8.9 - 10.3 mg/dL   GFR, Estimated 7 (L) >60 mL/min    Comment: (NOTE) Calculated using the CKD-EPI Creatinine Equation (2021)    Anion gap 13 5 - 15    Comment: Performed at [facility], [address] [place], [place] [address]  CBC     Status: Abnormal   Collection Time: 08/26/21  4:53 AM  Result Value Ref Range   WBC 14.7 (H) 4.0 - 10.5 K/uL   RBC 3.09 (L) 4.22 - 5.81 MIL/uL   Hemoglobin 9.6 (L) 13.0 - 17.0 g/dL   HCT 28.9 (L) 39.0 - 52.0 %   MCV 93.5 80.0 - 100.0 fL   MCH 31.1 26.0 - 34.0 pg   MCHC 33.2 30.0 - 36.0 g/dL   RDW 15.6 (H) 11.5 - 15.5 %   Platelets 221 150 - 400 K/uL   nRBC 0.0 0.0 - 0.2 %    Comment: Performed at [facility], [address] [place], [place] [address]  Glucose, capillary     Status: Abnormal   Collection Time: 08/26/21  8:13 AM  Result Value Ref Range   Glucose-Capillary 164 (H) 70 - 99 mg/dL    Comment: Glucose reference range applies only to samples taken after fasting for at least 8 hours.  Glucose, capillary     Status: Abnormal   Collection Time: 08/26/21 11:32 AM  Result Value Ref Range   Glucose-Capillary 150 (H) 70 - 99 mg/dL    Comment: Glucose reference range applies only to samples taken after fasting for at least 8 hours.  Glucose, capillary     Status: Abnormal   Collection Time: 08/26/21  1:25 PM  Result Value Ref Range   Glucose-Capillary 115 (H) 70 - 99 mg/dL    Comment: Glucose reference range applies only to samples taken after fasting for at least 8 hours.  Prepare RBC (crossmatch)     Status: None   Collection Time: 08/26/21  1:34 PM  Result Value Ref Range   Order Confirmation      ORDER PROCESSED BY BLOOD BANK Performed at [facility], [address] [place], [place] [address]     DG Chest Portable 1 View  Result Date: 08/25/2021 CLINICAL DATA:  No loc, slipped in shower, no hit head, no thinners, co of left leg pain, htn , swelling to left leg  EXAM: PORTABLE CHEST 1 VIEW COMPARISON:  Chest x-ray 06/04/2019 FINDINGS: The heart and mediastinal contours are within normal limits. Aortic valve replacement. Atrial appendage clip. Aortic calcification. Approximately 4 cm right paramediastinal upper lung airspace opacity. No pulmonary edema. At least trace to small volume left pleural effusion. No right pleural effusion. No pneumothorax. No acute osseous abnormality.Intact appearing sternotomy wires. IMPRESSION: 1. Approximately 4 cm right paramediastinal upper lung airspace opacity. 2. At least trace to small volume left pleural effusion. 3. Recommend CT chest with intravenous contrast (not CTPA) for further evaluation. Electronically Signed   By: Iven Finn M.D.   On: 08/25/2021 23:32   DG Femur Min 2 Views Left  Result Date: 08/25/2021 CLINICAL DATA:  Pain after slip and fall injury. EXAM: LEFT FEMUR 2 VIEWS COMPARISON:  11/22/2020 FINDINGS: Acute transverse fracture of the left femoral neck with varus angulation of the fracture fragments. No dislocation at the hip joint. Visualized  left hemipelvis appears intact. Midshaft and distal femur is intact. No dislocation at the knee joint. Extensive vascular calcifications in the soft tissues. Catheter coiled in the pelvis likely represents a peritoneal dialysis catheter. IMPRESSION: Acute transverse fracture of the left femoral neck with varus angulation of the fracture fragments. Electronically Signed   By: Lucienne Capers M.D.   On: 08/25/2021 23:02    Review of Systems  All other systems reviewed and are negative. Blood pressure (!) 166/63, pulse 64, temperature 98.1 F (36.7 C), resp. rate 18, height 5\' 9"  (1.753 m), weight 80 kg, SpO2 97 %. Physical Exam Constitutional:      Appearance: Normal appearance. He is normal weight.  HENT:     Head: Normocephalic.  Eyes:     Extraocular Movements: Extraocular movements intact.     Conjunctiva/sclera: Conjunctivae normal.     Pupils: Pupils are  equal, round, and reactive to light.  Cardiovascular:     Rate and Rhythm: Normal rate and regular rhythm.     Pulses: Normal pulses.     Heart sounds: Normal heart sounds.  Pulmonary:     Effort: Pulmonary effort is normal.     Breath sounds: Normal breath sounds.  Abdominal:     General: Abdomen is flat. Bowel sounds are normal.     Palpations: Abdomen is soft.  Musculoskeletal:        General: Deformity and signs of injury present. Normal range of motion.     Cervical back: Normal range of motion.     Left lower leg: Edema present.  Skin:    General: Skin is warm and dry.  Neurological:     General: No focal deficit present.     Mental Status: He is alert and oriented to person, place, and time. Mental status is at baseline.  Psychiatric:        Mood and Affect: Mood normal.        Behavior: Behavior normal.    Assessment/Plan: This is a 80 year old male with recent left hip fracture, needs permacath for dialysis due to extensive rehabilitation due to left hip fracture.  Currently has a PD catheter for dialysis. Awaiting left hip hemiarthroplasty today.  Plan for Permacath Placement right IJ vs left IJ.  Elmore Guise 08/26/2021, 2:53 PM

## 2021-08-26 NOTE — Progress Notes (Addendum)
Bladder scan this morning showed 415cc. Once order placed for in and out catheter. Unable to place in and out 16 french due to resistance from prostate. Verbal order to try to place 14 french foley catheter due to patient going down to surgery for hip and unable urinate. This nurse tried placing 14 french foley but met resistance as well. 8 french foley received from ED. Dr. Posey Pronto notified urology to see if urology can place foley catheter. Dr. Bernardo Heater to come up to bedside for placement.   Urology unable to place foley. Dr. Bernardo Heater states he is going to put in the orders to place foley while in surgery. Nurse in preop, Brandy, notified of situation. Consent completed and in charge.

## 2021-08-26 NOTE — Progress Notes (Signed)
Patient has an order for buck's traction. Supplies unavailable on the unit. I called the [facility] and he was unable to obtain supplies. Dr. Sabra Heck notified. States okay to leave buck's traction off until day shift. Patient also to have surgery later this morning. Patient receiving NS @ 75. Order clarified with Dr. Sidney Ace. Order given to saline patient's IV.

## 2021-08-26 NOTE — Progress Notes (Signed)
[place] [facility] [place] NAME: Manuel Klein    MR#:  [id number]  DATE OF BIRTH:  1941-05-18  SUBJECTIVE:  patient came in after mechanical fall at home he slept in his bathroom. Has a fracture. Difficulty urinating today. History of BPH. Wife at bedside. RN attempted in and out and placement of Foley unable to do so.  REVIEW OF SYSTEMS:   Review of Systems  Constitutional:  Negative for chills, fever and weight loss.  HENT:  Negative for ear discharge, ear pain and nosebleeds.   Eyes:  Negative for blurred vision, pain and discharge.  Respiratory:  Negative for sputum production, shortness of breath, wheezing and stridor.   Cardiovascular:  Negative for chest pain, palpitations, orthopnea and PND.  Gastrointestinal:  Negative for abdominal pain, diarrhea, nausea and vomiting.  Genitourinary:  Negative for frequency and urgency.  Musculoskeletal:  Positive for joint pain. Negative for back pain.  Neurological:  Positive for weakness. Negative for sensory change, speech change and focal weakness.  Psychiatric/Behavioral:  Negative for depression and hallucinations. The patient is not nervous/anxious.   Tolerating Diet:npo Tolerating PT: pending  DRUG ALLERGIES:   Allergies  Allergen Reactions   Hydralazine     Other reaction(s): Dizziness, Muscle Pain Chest pain   Atorvastatin     Other reaction(s): Muscle Pain   Carvedilol Other (See Comments)    Dizziness, drunk feeling   Ciprofloxacin Rash   Other Rash   Penicillins Rash and Other (See Comments)    Has patient had a PCN reaction causing immediate rash, facial/tongue/throat swelling, SOB or lightheadedness with hypotension: No Has patient had a PCN reaction causing severe rash involving mucus membranes or skin necrosis: No Has patient had a PCN reaction that required hospitalization: No Has patient had a PCN reaction occurring within the last 10 years: No If all of the above answers  are "NO", then may proceed with Cephalosporin use.    Sulfa Antibiotics Rash   Sulfasalazine Rash    VITALS:  Blood pressure (!) 166/63, pulse 64, temperature 98.1 F (36.7 C), resp. rate 18, height 5\' 9"  (1.753 m), weight 80 kg, SpO2 97 %.  PHYSICAL EXAMINATION:   Physical Exam  GENERAL:  80 y.o.-year-old patient lying in the bed with no acute distress.  HEENT: Head atraumatic, normocephalic. Oropharynx and nasopharynx clear.  NECK:  Supple, no jugular venous distention. No thyroid enlargement, no tenderness.  LUNGS: Normal breath sounds bilaterally, no wheezing, rales, rhonchi. No use of accessory muscles of respiration.  CARDIOVASCULAR: S1, S2 normal. No murmurs, rubs, or gallops.  ABDOMEN: Soft, nontender, nondistended. Bowel sounds present. No organomegaly or mass.  EXTREMITIES: No cyanosis, clubbing or edema b/l.   Left lower Extremity decreased ROM NEUROLOGIC: Cranial nerves II through XII are intact. No focal Motor or sensory deficits b/l.   PSYCHIATRIC:  patient is alert and oriented x 3.  SKIN: No obvious rash, lesion, or ulcer.   LABORATORY PANEL:  CBC Recent Labs  Lab 08/26/21 0453  WBC 14.7*  HGB 9.6*  HCT 28.9*  PLT 221    Chemistries  Recent Labs  Lab 08/26/21 0453  NA 139  K 3.4*  CL 102  CO2 24  GLUCOSE 192*  BUN 69*  CREATININE 7.53*  CALCIUM 7.7*   Cardiac Enzymes No results for input(s): TROPONINI in the last 168 hours. RADIOLOGY:  DG Chest Portable 1 View  Result Date: 08/25/2021 CLINICAL DATA:  No loc, slipped in shower, no hit  head, no thinners, co of left leg pain, htn , swelling to left leg EXAM: PORTABLE CHEST 1 VIEW COMPARISON:  Chest x-ray 06/04/2019 FINDINGS: The heart and mediastinal contours are within normal limits. Aortic valve replacement. Atrial appendage clip. Aortic calcification. Approximately 4 cm right paramediastinal upper lung airspace opacity. No pulmonary edema. At least trace to small volume left pleural effusion. No  right pleural effusion. No pneumothorax. No acute osseous abnormality.Intact appearing sternotomy wires. IMPRESSION: 1. Approximately 4 cm right paramediastinal upper lung airspace opacity. 2. At least trace to small volume left pleural effusion. 3. Recommend CT chest with intravenous contrast (not CTPA) for further evaluation. Electronically Signed   By: Iven Finn M.D.   On: 08/25/2021 23:32   DG Femur Min 2 Views Left  Result Date: 08/25/2021 CLINICAL DATA:  Pain after slip and fall injury. EXAM: LEFT FEMUR 2 VIEWS COMPARISON:  11/22/2020 FINDINGS: Acute transverse fracture of the left femoral neck with varus angulation of the fracture fragments. No dislocation at the hip joint. Visualized left hemipelvis appears intact. Midshaft and distal femur is intact. No dislocation at the knee joint. Extensive vascular calcifications in the soft tissues. Catheter coiled in the pelvis likely represents a peritoneal dialysis catheter. IMPRESSION: Acute transverse fracture of the left femoral neck with varus angulation of the fracture fragments. Electronically Signed   By: Lucienne Capers M.D.   On: 08/25/2021 23:02   ASSESSMENT AND PLAN:  Manuel Klein is a 80 y.o. Caucasian male with medical history significant for end-stage renal disease on peritoneal dialysis, CHF, coronary artery disease, type diabetes mellitus, hypertension, dyslipidemia, GERD and asthma, who presented to the ER with acute onset of accidental mechanical fall on his left side, slipping in the shower with subsequent left hip pain and inability to get up.     Closed left hip/femoral neck fracture secondary to mechanical fall. - prn Pain meds - Aspirin on hold - Orthopedic consultation with  Dr. Sabra Heck    End-stage renal disease on peritoneal dialysis. - Nephrology consultation with Dr Raliegh Ip - patient is on peritoneal dialysis. Will switch him to hemodialysis perioperative and for rehab purposes. Vascular consultation has been  placed  Essential hypertension. -  continue his antihypertensives  Type 2 diabetes mellitus with HL - The patient will be placed on supplement coverage with NovoLog.  BPH acute urinary retention - continue Flomax. -- Urology consultation with Dr. Bernardo Heater for acute urinary retention and inability place catheter. Dr. Bernardo Heater recommends Foley catheter placement under anesthesia cystoscopy and possible urethral dilatation   Hypothyroidism. -  continue Synthroid.  Dyslipidemia. - continue statin therapy.  GERD. -  continue PPI therapy.  Asthma without exacerbation. --as needed oxygen  Procedures: Family communication :wife at bedside Consults :ortho,urology CODE STATUS: DNR DVT Prophylaxis :SCD Level of care: Med-Surg Status is: Inpatient  Remains inpatient appropriate because:IV treatments appropriate due to intensity of illness or inability to take PO  Dispo: The patient is from: Home              Anticipated d/c is to: SNF              Patient currently is not medically stable to d/c.   Difficult to place patient No        TOTAL TIME TAKING CARE OF THIS PATIENT: 35 minutes.  >50% time spent on counselling and coordination of care  Note: This dictation was prepared with Dragon dictation along with smaller phrase technology. Any transcriptional errors that result from this  process are unintentional.  Fritzi Mandes M.D    [facility]   CC: Primary care physician; Lynnell Jude, MD Patient ID: [name], male   DOB: 12-22-1940, 80 y.o.   MRN: [id number]

## 2021-08-26 NOTE — Transfer of Care (Signed)
Immediate Anesthesia Transfer of Care Note  Patient: Manuel Klein  Procedure(s) Performed: ARTHROPLASTY UNIPOLAR  HIP (HEMIARTHROPLASTY) (Left: Hip) CYSTOSCOPY  Patient Location: PACU  Anesthesia Type:Spinal  Level of Consciousness: awake  Airway & Oxygen Therapy: Patient Spontanous Breathing  Post-op Assessment: Report given to RN  Post vital signs: stable  Last Vitals:  Vitals Value Taken Time  BP    Temp    Pulse 68 08/26/21 1752  Resp 14 08/26/21 1752  SpO2 95 % 08/26/21 1752  Vitals shown include unvalidated device data.  Last Pain:  Vitals:   08/26/21 0821  TempSrc:   PainSc: 8          Complications: No notable events documented.

## 2021-08-26 NOTE — Consult Note (Signed)
Reason for Consult:Permacath dialysis catheter placement. Referring Physician: Fritzi Mandes, MD  Manuel Klein is an 80 y.o. male.  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  He will need rehabilitation after his left hip hemiarthroplasty and will need HD for back up.   Past Medical History:  Diagnosis Date   Asthma    CHF (congestive heart failure) (HCC)    Chronic kidney disease    stage IV   Coronary artery disease    Diabetes mellitus without complication (HCC)    GERD (gastroesophageal reflux disease)    Hyperlipidemia    Hypertension    Myocardial infarction ([place])    Seizures ([place])    none since 20"s    Past Surgical History:  Procedure Laterality Date   CAPD INSERTION N/A 11/07/2017   Procedure: LAPAROSCOPIC INSERTION CONTINUOUS AMBULATORY PERITONEAL DIALYSIS  (CAPD) CATHETER;  Surgeon: Katha Cabal, MD;  Location: [facility] ORS;  Service: Vascular;  Laterality: N/A;   CARDIAC SURGERY     stent placement   CATARACT EXTRACTION, BILATERAL     COLONOSCOPY     CORONARY ANGIOPLASTY     ESOPHAGOGASTRODUODENOSCOPY (EGD) WITH PROPOFOL N/A 05/16/2021   Procedure: ESOPHAGOGASTRODUODENOSCOPY (EGD) WITH PROPOFOL;  Surgeon: Jonathon Bellows, MD;  Location: [facility] ENDOSCOPY;  Service: Gastroenterology;  Laterality: N/A;   GIVENS CAPSULE STUDY N/A 05/18/2021   Procedure: GIVENS CAPSULE STUDY;  Surgeon: Jonathon Bellows, MD;  Location: [facility] ENDOSCOPY;  Service: Gastroenterology;  Laterality: N/A;   RETINAL DETACHMENT SURGERY      Family History  Problem Relation Age of Onset   Heart disease Mother    Diabetes Mother    Diabetes Father     Social History:  reports that he has been smoking. He has never used smokeless tobacco. He reports that he does not drink alcohol and does not use drugs.  Allergies:  Allergies  Allergen Reactions   Hydralazine     Other reaction(s): Dizziness, Muscle Pain Chest pain   Atorvastatin     Other reaction(s): Muscle Pain    Carvedilol Other (See Comments)    Dizziness, drunk feeling   Ciprofloxacin Rash   Other Rash   Penicillins Rash and Other (See Comments)    Has patient had a PCN reaction causing immediate rash, facial/tongue/throat swelling, SOB or lightheadedness with hypotension: No Has patient had a PCN reaction causing severe rash involving mucus membranes or skin necrosis: No Has patient had a PCN reaction that required hospitalization: No Has patient had a PCN reaction occurring within the last 10 years: No If all of the above answers are "NO", then may proceed with Cephalosporin use.    Sulfa Antibiotics Rash   Sulfasalazine Rash    Medications: I have reviewed the patient's current medications.  Results for orders placed or performed during the hospital encounter of 08/25/21 (from the past 48 hour(s))  CBC with Differential     Status: Abnormal   Collection Time: 08/25/21 10:28 PM  Result Value Ref Range   WBC 16.3 (H) 4.0 - 10.5 K/uL   RBC 3.55 (L) 4.22 - 5.81 MIL/uL   Hemoglobin 11.0 (L) 13.0 - 17.0 g/dL   HCT 32.9 (L) 39.0 - 52.0 %   MCV 92.7 80.0 - 100.0 fL   MCH 31.0 26.0 - 34.0 pg   MCHC 33.4 30.0 - 36.0 g/dL   RDW 15.7 (H) 11.5 - 15.5 %   Platelets 270 150 - 400 K/uL   nRBC 0.0 0.0 -  0.2 %   Neutrophils Relative % 62 %   Neutro Abs 10.1 (H) 1.7 - 7.7 K/uL   Band Neutrophils 0 %   Lymphocytes Relative 7 %   Lymphs Abs 1.2 0.7 - 4.0 K/uL   Monocytes Relative 7 %   Monocytes Absolute 1.1 (H) 0.1 - 1.0 K/uL   Eosinophils Relative 23 %   Eosinophils Absolute 3.7 (H) 0.0 - 0.5 K/uL   Basophils Relative 0 %   Basophils Absolute 0.1 0.0 - 0.1 K/uL   WBC Morphology MORPHOLOGY UNREMARKABLE    RBC Morphology MORPHOLOGY UNREMARKABLE    Smear Review Normal platelet morphology    Other 0 %   nRBC 0 0 /100 WBC   Metamyelocytes Relative 0 %   Myelocytes 0 %   Promyelocytes Relative 0 %   Blasts 0 %   Immature Granulocytes 0 %   Abs Immature Granulocytes 0.12 (H) 0.00 - 0.07 K/uL     Comment: Performed at [facility], [address] [place], [place] [address]  Basic metabolic panel     Status: Abnormal   Collection Time: 08/25/21 10:28 PM  Result Value Ref Range   Sodium 138 135 - 145 mmol/L   Potassium 3.5 3.5 - 5.1 mmol/L   Chloride 100 98 - 111 mmol/L   CO2 25 22 - 32 mmol/L   Glucose, Bld 199 (H) 70 - 99 mg/dL    Comment: Glucose reference range applies only to samples taken after fasting for at least 8 hours.   BUN 67 (H) 8 - 23 mg/dL   Creatinine, Ser 7.67 (H) 0.61 - 1.24 mg/dL   Calcium 7.9 (L) 8.9 - 10.3 mg/dL   GFR, Estimated 7 (L) >60 mL/min    Comment: (NOTE) Calculated using the CKD-EPI Creatinine Equation (2021)    Anion gap 13 5 - 15    Comment: Performed at [facility], [address] [place], [place] [address]  Protime-INR     Status: None   Collection Time: 08/25/21 10:28 PM  Result Value Ref Range   Prothrombin Time 13.7 11.4 - 15.2 seconds   INR 1.1 0.8 - 1.2    Comment: (NOTE) INR goal varies based on device and disease states. Performed at [facility], [address] [place],  [address]   Resp Panel by RT-PCR (Flu A&B, Covid) Nasopharyngeal Swab     Status: None   Collection Time: 08/25/21 11:10 PM   Specimen: Nasopharyngeal Swab; Nasopharyngeal(NP) swabs in vial transport medium  Result Value Ref Range   SARS Coronavirus 2 by RT PCR NEGATIVE NEGATIVE    Comment: (NOTE) SARS-CoV-2 target nucleic acids are NOT DETECTED.  The SARS-CoV-2 RNA is generally detectable in upper respiratory specimens during the acute phase of infection. The lowest concentration of SARS-CoV-2 viral copies this assay can detect is 138 copies/mL. A negative result does not preclude SARS-Cov-2 infection and should not be used as the sole basis for treatment or other patient management decisions. A negative result may occur with  improper specimen collection/handling, submission of specimen other than  nasopharyngeal swab, presence of viral mutation(s) within the areas targeted by this assay, and inadequate number of viral copies(<138 copies/mL). A negative result must be combined with clinical observations, patient history, and epidemiological information. The expected result is Negative.  Fact Sheet for Patients:  EntrepreneurPulse.com.au  Fact Sheet for Healthcare Providers:  IncredibleEmployment.be  This test is no t yet approved or cleared by the [place] FDA and  has  been authorized for detection and/or diagnosis of SARS-CoV-2 by FDA under an Emergency Use Authorization (EUA). This EUA will remain  in effect (meaning this test can be used) for the duration of the COVID-19 declaration under Section 564(b)(1) of the Act, 21 U.S.C.section 360bbb-3(b)(1), unless the authorization is terminated  or revoked sooner.       Influenza A by PCR NEGATIVE NEGATIVE   Influenza B by PCR NEGATIVE NEGATIVE    Comment: (NOTE) The Xpert Xpress SARS-CoV-2/FLU/RSV plus assay is intended as an aid in the diagnosis of influenza from Nasopharyngeal swab specimens and should not be used as a sole basis for treatment. Nasal washings and aspirates are unacceptable for Xpert Xpress SARS-CoV-2/FLU/RSV testing.  Fact Sheet for Patients: EntrepreneurPulse.com.au  Fact Sheet for Healthcare Providers: IncredibleEmployment.be  This test is not yet approved or cleared by the [place] FDA and has been authorized for detection and/or diagnosis of SARS-CoV-2 by FDA under an Emergency Use Authorization (EUA). This EUA will remain in effect (meaning this test can be used) for the duration of the COVID-19 declaration under Section 564(b)(1) of the Act, 21 U.S.C. section 360bbb-3(b)(1), unless the authorization is terminated or revoked.  Performed at [facility], [address] [place], [place] [address]   Type  and screen     Status: None (Preliminary result)   Collection Time: 08/26/21  1:25 AM  Result Value Ref Range   ABO/RH(D) A POS    Antibody Screen NEG    Sample Expiration      08/29/2021,2359 Performed at [facility], [address] [place], [place] [address]    Unit Number [id number]    Blood Component Type RED CELLS,LR    Unit division 00    Status of Unit ALLOCATED    Transfusion Status OK TO TRANSFUSE    Crossmatch Result Compatible    Unit Number [id number]    Blood Component Type RED CELLS,LR    Unit division 00    Status of Unit ALLOCATED    Transfusion Status OK TO TRANSFUSE    Crossmatch Result Compatible   Glucose, capillary     Status: Abnormal   Collection Time: 08/26/21  4:08 AM  Result Value Ref Range   Glucose-Capillary 207 (H) 70 - 99 mg/dL    Comment: Glucose reference range applies only to samples taken after fasting for at least 8 hours.   Comment 1 Notify RN    Comment 2 Document in Chart   Surgical PCR screen     Status: None   Collection Time: 08/26/21  4:27 AM   Specimen: Nasal Mucosa; Nasal Swab  Result Value Ref Range   MRSA, PCR NEGATIVE NEGATIVE   Staphylococcus aureus NEGATIVE NEGATIVE    Comment: (NOTE) The Xpert SA Assay (FDA approved for NASAL specimens in patients 39 years of age and older), is one component of a comprehensive surveillance program. It is not intended to diagnose infection nor to guide or monitor treatment. Performed at [facility], [address] [place], [place] [address]   Basic metabolic panel     Status: Abnormal   Collection Time: 08/26/21  4:53 AM  Result Value Ref Range   Sodium 139 135 - 145 mmol/L   Potassium 3.4 (L) 3.5 - 5.1 mmol/L   Chloride 102 98 - 111 mmol/L   CO2 24 22 - 32 mmol/L   Glucose, Bld 192 (H) 70 - 99 mg/dL    Comment: Glucose reference range applies only to samples taken  after fasting for at least 8 hours.   BUN 69 (H) 8 - 23 mg/dL   Creatinine, Ser 7.53 (H)  0.61 - 1.24 mg/dL   Calcium 7.7 (L) 8.9 - 10.3 mg/dL   GFR, Estimated 7 (L) >60 mL/min    Comment: (NOTE) Calculated using the CKD-EPI Creatinine Equation (2021)    Anion gap 13 5 - 15    Comment: Performed at [facility], [address] [place], [place] [address]  CBC     Status: Abnormal   Collection Time: 08/26/21  4:53 AM  Result Value Ref Range   WBC 14.7 (H) 4.0 - 10.5 K/uL   RBC 3.09 (L) 4.22 - 5.81 MIL/uL   Hemoglobin 9.6 (L) 13.0 - 17.0 g/dL   HCT 28.9 (L) 39.0 - 52.0 %   MCV 93.5 80.0 - 100.0 fL   MCH 31.1 26.0 - 34.0 pg   MCHC 33.2 30.0 - 36.0 g/dL   RDW 15.6 (H) 11.5 - 15.5 %   Platelets 221 150 - 400 K/uL   nRBC 0.0 0.0 - 0.2 %    Comment: Performed at [facility], [address] [place], [place] [address]  Glucose, capillary     Status: Abnormal   Collection Time: 08/26/21  8:13 AM  Result Value Ref Range   Glucose-Capillary 164 (H) 70 - 99 mg/dL    Comment: Glucose reference range applies only to samples taken after fasting for at least 8 hours.  Glucose, capillary     Status: Abnormal   Collection Time: 08/26/21 11:32 AM  Result Value Ref Range   Glucose-Capillary 150 (H) 70 - 99 mg/dL    Comment: Glucose reference range applies only to samples taken after fasting for at least 8 hours.  Glucose, capillary     Status: Abnormal   Collection Time: 08/26/21  1:25 PM  Result Value Ref Range   Glucose-Capillary 115 (H) 70 - 99 mg/dL    Comment: Glucose reference range applies only to samples taken after fasting for at least 8 hours.  Prepare RBC (crossmatch)     Status: None   Collection Time: 08/26/21  1:34 PM  Result Value Ref Range   Order Confirmation      ORDER PROCESSED BY BLOOD BANK Performed at [facility], [address] [place], [place] [address]     DG Chest Portable 1 View  Result Date: 08/25/2021 CLINICAL DATA:  No loc, slipped in shower, no hit head, no thinners, co of left leg pain, htn , swelling to left leg  EXAM: PORTABLE CHEST 1 VIEW COMPARISON:  Chest x-ray 06/04/2019 FINDINGS: The heart and mediastinal contours are within normal limits. Aortic valve replacement. Atrial appendage clip. Aortic calcification. Approximately 4 cm right paramediastinal upper lung airspace opacity. No pulmonary edema. At least trace to small volume left pleural effusion. No right pleural effusion. No pneumothorax. No acute osseous abnormality.Intact appearing sternotomy wires. IMPRESSION: 1. Approximately 4 cm right paramediastinal upper lung airspace opacity. 2. At least trace to small volume left pleural effusion. 3. Recommend CT chest with intravenous contrast (not CTPA) for further evaluation. Electronically Signed   By: Iven Finn M.D.   On: 08/25/2021 23:32   DG Femur Min 2 Views Left  Result Date: 08/25/2021 CLINICAL DATA:  Pain after slip and fall injury. EXAM: LEFT FEMUR 2 VIEWS COMPARISON:  11/22/2020 FINDINGS: Acute transverse fracture of the left femoral neck with varus angulation of the fracture fragments. No dislocation at the hip joint. Visualized  left hemipelvis appears intact. Midshaft and distal femur is intact. No dislocation at the knee joint. Extensive vascular calcifications in the soft tissues. Catheter coiled in the pelvis likely represents a peritoneal dialysis catheter. IMPRESSION: Acute transverse fracture of the left femoral neck with varus angulation of the fracture fragments. Electronically Signed   By: Lucienne Capers M.D.   On: 08/25/2021 23:02    Review of Systems  All other systems reviewed and are negative. Blood pressure (!) 166/63, pulse 64, temperature 98.1 F (36.7 C), resp. rate 18, height 5\' 9"  (1.753 m), weight 80 kg, SpO2 97 %. Physical Exam Constitutional:      Appearance: Normal appearance. He is normal weight.  HENT:     Head: Normocephalic.  Eyes:     Extraocular Movements: Extraocular movements intact.     Conjunctiva/sclera: Conjunctivae normal.     Pupils: Pupils are  equal, round, and reactive to light.  Cardiovascular:     Rate and Rhythm: Normal rate and regular rhythm.     Pulses: Normal pulses.     Heart sounds: Normal heart sounds.  Pulmonary:     Effort: Pulmonary effort is normal.     Breath sounds: Normal breath sounds.  Abdominal:     General: Abdomen is flat. Bowel sounds are normal.     Palpations: Abdomen is soft.  Musculoskeletal:        General: Deformity and signs of injury present. Normal range of motion.     Cervical back: Normal range of motion.     Left lower leg: Edema present.  Skin:    General: Skin is warm and dry.  Neurological:     General: No focal deficit present.     Mental Status: He is alert and oriented to person, place, and time. Mental status is at baseline.  Psychiatric:        Mood and Affect: Mood normal.        Behavior: Behavior normal.    Assessment/Plan: This is a 80 year old male with recent left hip fracture, needs permacath for dialysis due to extensive rehabilitation due to left hip fracture.  Currently has a PD catheter for dialysis. Awaiting left hip hemiarthroplasty today.  Plan for Permacath Placement right IJ vs left IJ.  Elmore Guise 08/26/2021, 2:53 PM

## 2021-08-26 NOTE — Anesthesia Preprocedure Evaluation (Addendum)
Anesthesia Evaluation  Patient identified by MRN, date of birth, ID band Patient awake    Reviewed: Allergy & Precautions, NPO status , Patient's Chart, lab work & pertinent test results  History of Anesthesia Complications Negative for: history of anesthetic complications  Airway Mallampati: III  TM Distance: >3 FB Neck ROM: limited    Dental  (+) Chipped   Pulmonary asthma , Current Smoker and Patient abstained from smoking.,    Pulmonary exam normal        Cardiovascular hypertension, (-) angina+ CAD, + Past MI and +CHF  Normal cardiovascular exam  Pt had history of [facility]R at [facility]/2020 wihout issues.  Echo at that time showed normal EF.   Neuro/Psych Seizures -,  negative psych ROS   GI/Hepatic negative GI ROS, Neg liver ROS, GERD  ,  Endo/Other  negative endocrine ROSdiabetes, Type 2  Renal/GU DialysisRenal disease  negative genitourinary   Musculoskeletal   Abdominal   Peds  Hematology negative hematology ROS (+)   Anesthesia Other Findings Patient is NPO appropriate and reports no nausea or vomiting today.  Past Medical History: No date: Asthma No date: CHF (congestive heart failure) (HCC) No date: Chronic kidney disease     Comment:  stage IV No date: Coronary artery disease No date: Diabetes mellitus without complication (HCC) No date: GERD (gastroesophageal reflux disease) No date: Hyperlipidemia No date: Hypertension No date: Myocardial infarction ([place]) No date: Seizures ([place])     Comment:  none since 20"s  Past Surgical History: 11/07/2017: CAPD INSERTION; N/A     Comment:  Procedure: LAPAROSCOPIC INSERTION CONTINUOUS AMBULATORY               PERITONEAL DIALYSIS  (CAPD) CATHETER;  Surgeon: Katha Cabal, MD;  Location: [facility] ORS;  Service: Vascular;                Laterality: N/A; No date: CARDIAC SURGERY     Comment:  stent placement No date: CATARACT EXTRACTION,  BILATERAL No date: COLONOSCOPY No date: CORONARY ANGIOPLASTY No date: RETINAL DETACHMENT SURGERY  BMI    Body Mass Index: 22.46 kg/m      Reproductive/Obstetrics negative OB ROS                             Anesthesia Physical  Anesthesia Plan  ASA: 4  Anesthesia Plan: General/Spinal   Post-op Pain Management:    Induction: Intravenous  PONV Risk Score and Plan: 2 and Propofol infusion, TIVA and Ondansetron  Airway Management Planned: Natural Airway and Simple Face Mask  Additional Equipment:   Intra-op Plan:   Post-operative Plan:   Informed Consent: I have reviewed the patients History and Physical, chart, labs and discussed the procedure including the risks, benefits and alternatives for the proposed anesthesia with the patient or authorized representative who has indicated his/her understanding and acceptance.   Patient has DNR.  Suspend DNR.   Dental Advisory Given  Plan Discussed with: Anesthesiologist, CRNA and Surgeon  Anesthesia Plan Comments: (Patient consented for risks of anesthesia including but not limited to:  - adverse reactions to medications - risk of airway placement if required - damage to eyes, teeth, lips or other oral mucosa - nerve damage due to positioning  - sore throat or hoarseness - Damage to heart, brain, nerves, lungs, other parts of body or loss of life  Patient voiced understanding.)       Anesthesia Quick Evaluation

## 2021-08-26 NOTE — Progress Notes (Addendum)
Admitted for left hip fracture. Pending surgery today. NPO since MN. Complaining that he feels the urge to void, but is unable to. States that although he is in ESRD he still produces a good amount of urine. States that he usually void 5-6 times / day and the amount is usually a cup or more. He was bladder scanned and it showed 351 cc of urine. Dr. Sidney Ace notified. Oncoming nurses Abigail and [employer] notified.

## 2021-08-26 NOTE — Consult Note (Signed)
Urology Consult  Requesting physician: Fritzi Mandes, MD  Recent consultation: Foley catheter placement  Chief Complaint: Urinary urgency  History of Present Illness: Manuel Klein is a 79 y.o. admitted after a fall sustaining a displaced subcapital fracture of the left hip.  Scheduled for left hip hemiarthroplasty later today.  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  Was complaining of urge to void without success and bladder scan volume ~ 350 mL  Nursing staff unsuccessful in Foley catheter placement.  Denies prior history urologic problems or prostate surgery.  Past Medical History:  Diagnosis Date   Asthma    CHF (congestive heart failure) (HCC)    Chronic kidney disease    stage IV   Coronary artery disease    Diabetes mellitus without complication (HCC)    GERD (gastroesophageal reflux disease)    Hyperlipidemia    Hypertension    Myocardial infarction ([place])    Seizures ([place])    none since 20"s    Past Surgical History:  Procedure Laterality Date   CAPD INSERTION N/A 11/07/2017   Procedure: LAPAROSCOPIC INSERTION CONTINUOUS AMBULATORY PERITONEAL DIALYSIS  (CAPD) CATHETER;  Surgeon: Katha Cabal, MD;  Location: [facility] ORS;  Service: Vascular;  Laterality: N/A;   CARDIAC SURGERY     stent placement   CATARACT EXTRACTION, BILATERAL     COLONOSCOPY     CORONARY ANGIOPLASTY     ESOPHAGOGASTRODUODENOSCOPY (EGD) WITH PROPOFOL N/A 05/16/2021   Procedure: ESOPHAGOGASTRODUODENOSCOPY (EGD) WITH PROPOFOL;  Surgeon: Jonathon Bellows, MD;  Location: [facility] ENDOSCOPY;  Service: Gastroenterology;  Laterality: N/A;   GIVENS CAPSULE STUDY N/A 05/18/2021   Procedure: GIVENS CAPSULE STUDY;  Surgeon: Jonathon Bellows, MD;  Location: [facility] ENDOSCOPY;  Service: Gastroenterology;  Laterality: N/A;   RETINAL DETACHMENT SURGERY      Home Medications:  No outpatient medications have been marked as taking for the 08/25/21 encounter [facility] Encounter).    Allergies:  Allergies   Allergen Reactions   Hydralazine     Other reaction(s): Dizziness, Muscle Pain Chest pain   Atorvastatin     Other reaction(s): Muscle Pain   Carvedilol Other (See Comments)    Dizziness, drunk feeling   Ciprofloxacin Rash   Other Rash   Penicillins Rash and Other (See Comments)    Has patient had a PCN reaction causing immediate rash, facial/tongue/throat swelling, SOB or lightheadedness with hypotension: No Has patient had a PCN reaction causing severe rash involving mucus membranes or skin necrosis: No Has patient had a PCN reaction that required hospitalization: No Has patient had a PCN reaction occurring within the last 10 years: No If all of the above answers are "NO", then may proceed with Cephalosporin use.    Sulfa Antibiotics Rash   Sulfasalazine Rash    Family History  Problem Relation Age of Onset   Heart disease Mother    Diabetes Mother    Diabetes Father     Social History:  reports that he has been smoking. He has never used smokeless tobacco. He reports that he does not drink alcohol and does not use drugs.  ROS: General-no fever chills GU-as per the HPI Musculoskeletal-as per the HPI  Physical Exam:  Vital signs in last 24 hours: Temp:  [97.6 F (36.4 C)-98.5 F (36.9 C)] 98.1 F (36.7 C) (10/09 1131) Pulse Rate:  [64-77] 64 (10/09 1131) Resp:  [14-20] 18 (10/09 1131) BP: (165-190)/(63-85) 166/63 (10/09 1131) SpO2:  [96 %-99 %] 97 % (10/09 1131) Weight:  [80 kg] 80 kg (  10/08 2214) Constitutional:  Alert, appears uncomfortable secondary to hip fracture HEENT: [place] AT, moist mucus membranes.  Trachea midline, no masses GI: Abdomen is soft, nontender, nondistended, no abdominal masses GU: Phallus uncircumcised without lesions, meatus normal.  Testes descended bilateral without masses or tenderness.  External genitalia were prepped and draped with a Foley catheter kit.  A 12 French coud Coloplast catheter was advanced with resistance met in the region  of the bulbar/prostatic urethra   Laboratory Data:  Recent Labs    08/25/21 2228 08/26/21 0453  WBC 16.3* 14.7*  HGB 11.0* 9.6*  HCT 32.9* 28.9*   Recent Labs    08/25/21 2228 08/26/21 0453  NA 138 139  K 3.5 3.4*  CL 100 102  CO2 25 24  GLUCOSE 199* 192*  BUN 67* 69*  CREATININE 7.67* 7.53*  CALCIUM 7.9* 7.7*   Recent Labs    08/25/21 2228  INR 1.1   No results for input(s): LABURIN in the last 72 hours. Results for orders placed or performed during the hospital encounter of 08/25/21  Resp Panel by RT-PCR (Flu A&B, Covid) Nasopharyngeal Swab     Status: None   Collection Time: 08/25/21 11:10 PM   Specimen: Nasopharyngeal Swab; Nasopharyngeal(NP) swabs in vial transport medium  Result Value Ref Range Status   SARS Coronavirus 2 by RT PCR NEGATIVE NEGATIVE Final    Comment: (NOTE) SARS-CoV-2 target nucleic acids are NOT DETECTED.  The SARS-CoV-2 RNA is generally detectable in upper respiratory specimens during the acute phase of infection. The lowest concentration of SARS-CoV-2 viral copies this assay can detect is 138 copies/mL. A negative result does not preclude SARS-Cov-2 infection and should not be used as the sole basis for treatment or other patient management decisions. A negative result may occur with  improper specimen collection/handling, submission of specimen other than nasopharyngeal swab, presence of viral mutation(s) within the areas targeted by this assay, and inadequate number of viral copies(<138 copies/mL). A negative result must be combined with clinical observations, patient history, and epidemiological information. The expected result is Negative.  Fact Sheet for Patients:  EntrepreneurPulse.com.au  Fact Sheet for Healthcare Providers:  IncredibleEmployment.be  This test is no t yet approved or cleared by the [place] FDA and  has been authorized for detection and/or diagnosis of SARS-CoV-2  by FDA under an Emergency Use Authorization (EUA). This EUA will remain  in effect (meaning this test can be used) for the duration of the COVID-19 declaration under Section 564(b)(1) of the Act, 21 U.S.C.section 360bbb-3(b)(1), unless the authorization is terminated  or revoked sooner.       Influenza A by PCR NEGATIVE NEGATIVE Final   Influenza B by PCR NEGATIVE NEGATIVE Final    Comment: (NOTE) The Xpert Xpress SARS-CoV-2/FLU/RSV plus assay is intended as an aid in the diagnosis of influenza from Nasopharyngeal swab specimens and should not be used as a sole basis for treatment. Nasal washings and aspirates are unacceptable for Xpert Xpress SARS-CoV-2/FLU/RSV testing.  Fact Sheet for Patients: EntrepreneurPulse.com.au  Fact Sheet for Healthcare Providers: IncredibleEmployment.be  This test is not yet approved or cleared by the [place] FDA and has been authorized for detection and/or diagnosis of SARS-CoV-2 by FDA under an Emergency Use Authorization (EUA). This EUA will remain in effect (meaning this test can be used) for the duration of the COVID-19 declaration under Section 564(b)(1) of the Act, 21 U.S.C. section 360bbb-3(b)(1), unless the authorization is terminated or revoked.  Performed at [facility], [phone]  [address], [place], [place] [address]   Surgical PCR screen     Status: None   Collection Time: 08/26/21  4:27 AM   Specimen: Nasal Mucosa; Nasal Swab  Result Value Ref Range Status   MRSA, PCR NEGATIVE NEGATIVE Final   Staphylococcus aureus NEGATIVE NEGATIVE Final    Comment: (NOTE) The Xpert SA Assay (FDA approved for NASAL specimens in patients 16 years of age and older), is one component of a comprehensive surveillance program. It is not intended to diagnose infection nor to guide or monitor treatment. Performed at [facility], [address], [place], [place] [address]       Impression/Assessment:  Inability to place Foley catheter with resistance met and region of prostatic/bulbar urethra  Plan:  Scheduled for hip hemiarthroplasty this afternoon and recommend after anesthesia cystoscopy, possible urethral dilation with Foley catheter placement The procedure was discussed with patient who desires to proceed   08/26/2021, 1:37 PM  Ander Giovanni,  MD

## 2021-08-26 NOTE — Consult Note (Signed)
ORTHOPAEDIC CONSULTATION  REQUESTING PHYSICIAN: Fritzi Mandes, MD  Chief Complaint: Left hip pain  HPI: Manuel Klein is a 80 y.o. male who complains of left hip pain after slipping in the shower last evening and injuring the left hip.  He was brought to the emergency room where exam and x-rays revealed a displaced subcapital fracture of the left hip.  He was admitted for medical evaluation and surgical repair.  Patient has end-stage renal disease and had is on home peritoneal dialysis, congestive heart failure, coronary artery disease, diabetes mellitus, hypertension, GERD and asthma.  His BUN is 69 and creatinine 7.53.  He has been seen by the medical service and by Dr. Juleen China the nephrologist and cleared for surgery.  I advised the patient and his wife of the nature of the injury and treatment options.  They have elected to proceed with surgical treatment.  Risks and benefits and postop protocol were discussed with him.  Patient had major cardiac surgery last winter and did well following that.  Past Medical History:  Diagnosis Date   Asthma    CHF (congestive heart failure) (HCC)    Chronic kidney disease    stage IV   Coronary artery disease    Diabetes mellitus without complication (HCC)    GERD (gastroesophageal reflux disease)    Hyperlipidemia    Hypertension    Myocardial infarction ([place])    Seizures ([place])    none since 20"s   Past Surgical History:  Procedure Laterality Date   CAPD INSERTION N/A 11/07/2017   Procedure: LAPAROSCOPIC INSERTION CONTINUOUS AMBULATORY PERITONEAL DIALYSIS  (CAPD) CATHETER;  Surgeon: Katha Cabal, MD;  Location: [facility] ORS;  Service: Vascular;  Laterality: N/A;   CARDIAC SURGERY     stent placement   CATARACT EXTRACTION, BILATERAL     COLONOSCOPY     CORONARY ANGIOPLASTY     ESOPHAGOGASTRODUODENOSCOPY (EGD) WITH PROPOFOL N/A 05/16/2021   Procedure: ESOPHAGOGASTRODUODENOSCOPY (EGD) WITH PROPOFOL;  Surgeon: Jonathon Bellows, MD;  Location: [facility]  ENDOSCOPY;  Service: Gastroenterology;  Laterality: N/A;   GIVENS CAPSULE STUDY N/A 05/18/2021   Procedure: GIVENS CAPSULE STUDY;  Surgeon: Jonathon Bellows, MD;  Location: [facility] ENDOSCOPY;  Service: Gastroenterology;  Laterality: N/A;   RETINAL DETACHMENT SURGERY     Social History   Socioeconomic History   Marital status: Divorced    Spouse name: Not on file   Number of children: Not on file   Years of education: Not on file   Highest education level: Not on file  Occupational History   Not on file  Tobacco Use   Smoking status: Light Smoker   Smokeless tobacco: Never  Vaping Use   Vaping Use: Never used  Substance and Sexual Activity   Alcohol use: No   Drug use: No   Sexual activity: Not on file  Other Topics Concern   Not on file  Social History Narrative   Not on file   Social Determinants of Health   Financial Resource Strain: Not on file  Food Insecurity: Not on file  Transportation Needs: Not on file  Physical Activity: Not on file  Stress: Not on file  Social Connections: Not on file   Family History  Problem Relation Age of Onset   Heart disease Mother    Diabetes Mother    Diabetes Father    Allergies  Allergen Reactions   Hydralazine     Other reaction(s): Dizziness, Muscle Pain Chest pain   Atorvastatin     Other reaction(s):  Muscle Pain   Carvedilol Other (See Comments)    Dizziness, drunk feeling   Ciprofloxacin Rash   Other Rash   Penicillins Rash and Other (See Comments)    Has patient had a PCN reaction causing immediate rash, facial/tongue/throat swelling, SOB or lightheadedness with hypotension: No Has patient had a PCN reaction causing severe rash involving mucus membranes or skin necrosis: No Has patient had a PCN reaction that required hospitalization: No Has patient had a PCN reaction occurring within the last 10 years: No If all of the above answers are "NO", then may proceed with Cephalosporin use.    Sulfa Antibiotics Rash    Sulfasalazine Rash   Prior to Admission medications   Medication Sig Start Date End Date Taking? Authorizing Provider  amLODipine (NORVASC) 5 MG tablet Take 5 mg by mouth daily.    [provider]  aspirin 81 MG tablet Take 81 mg by mouth daily.  11/30/08   [provider]  Cyanocobalamin 5000 MCG CAPS Take 5,000 Units by mouth daily.    [provider]  furosemide (LASIX) 40 MG tablet Take 120 mg by mouth daily.    [provider]  hydrALAZINE (APRESOLINE) 25 MG tablet Take 25 mg by mouth 2 (two) times daily. 02/06/21   [provider]  insulin lispro (HUMALOG) 100 UNIT/ML KiwkPen Inject 6-10 Units into the skin 3 (three) times daily with meals.    [provider]  levothyroxine (SYNTHROID) 100 MCG tablet Take 100 mcg by mouth daily. 04/25/21   [provider]  losartan (COZAAR) 100 MG tablet Take 100 mg by mouth daily.    [provider]  magnesium oxide (MAG-OX) 400 MG tablet Take 400 mg by mouth daily.    [provider]  metoprolol tartrate (LOPRESSOR) 25 MG tablet Take 25 mg by mouth 2 (two) times daily. 04/25/21   [provider]  multivitamin (RENA-VIT) TABS tablet Take 1 tablet by mouth daily.    [provider]  pantoprazole (PROTONIX) 40 MG tablet Take 1 tablet (40 mg total) by mouth 2 (two) times daily. 05/19/21 06/18/21  Terrilee Croak, MD  rosuvastatin (CRESTOR) 10 MG tablet Take 10 mg by mouth daily.    [provider]  sevelamer carbonate (RENVELA) 800 MG tablet Take 800 mg by mouth 2 (two) times daily.    [provider]  tamsulosin (FLOMAX) 0.4 MG CAPS capsule Take 0.4 mg by mouth daily.    [provider]  VENTOLIN HFA 108 (90 Base) MCG/ACT inhaler Inhale 2 puffs into the lungs every 4 (four) hours as needed for wheezing or shortness of breath.    [provider]   DG Chest Portable 1 View  Result Date: 08/25/2021 CLINICAL DATA:  No loc, slipped in  shower, no hit head, no thinners, co of left leg pain, htn , swelling to left leg EXAM: PORTABLE CHEST 1 VIEW COMPARISON:  Chest x-ray 06/04/2019 FINDINGS: The heart and mediastinal contours are within normal limits. Aortic valve replacement. Atrial appendage clip. Aortic calcification. Approximately 4 cm right paramediastinal upper lung airspace opacity. No pulmonary edema. At least trace to small volume left pleural effusion. No right pleural effusion. No pneumothorax. No acute osseous abnormality.Intact appearing sternotomy wires. IMPRESSION: 1. Approximately 4 cm right paramediastinal upper lung airspace opacity. 2. At least trace to small volume left pleural effusion. 3. Recommend CT chest with intravenous contrast (not CTPA) for further evaluation. Electronically Signed   By: Iven Finn M.D.   On:  08/25/2021 23:32   DG Femur Min 2 Views Left  Result Date: 08/25/2021 CLINICAL DATA:  Pain after slip and fall injury. EXAM: LEFT FEMUR 2 VIEWS COMPARISON:  11/22/2020 FINDINGS: Acute transverse fracture of the left femoral neck with varus angulation of the fracture fragments. No dislocation at the hip joint. Visualized left hemipelvis appears intact. Midshaft and distal femur is intact. No dislocation at the knee joint. Extensive vascular calcifications in the soft tissues. Catheter coiled in the pelvis likely represents a peritoneal dialysis catheter. IMPRESSION: Acute transverse fracture of the left femoral neck with varus angulation of the fracture fragments. Electronically Signed   By: Lucienne Capers M.D.   On: 08/25/2021 23:02    Positive ROS: All other systems have been reviewed and were otherwise negative with the exception of those mentioned in the HPI and as above.  Physical Exam: General: Alert, no acute distress Cardiovascular: No pedal edema Respiratory: No cyanosis, no use of accessory musculature GI: No organomegaly, abdomen is soft and non-tender Skin: No lesions in the area of  chief complaint Neurologic: Sensation intact distally Psychiatric: Patient is competent for consent with normal mood and affect Lymphatic: No axillary or cervical lymphadenopathy  MUSCULOSKELETAL: Left leg is shortened and externally rotated.  Is severe pain with movement.  There is neurovascular status is good.  The skin is intact.  Right leg shows no pain or swelling.  They have upper extremities are normal and the back is nontender.  Patient is alert and awake today.  Assessment: Displaced subcapital fracture left hip and a patient with end-stage renal disease and multiple medical issues  Plan: Will have a left hip hemiarthroplasty later today.  Risks and benefits have been discussed with the patient and his wife.    Park Breed, MD [phone]   08/26/2021 12:23 PM

## 2021-08-26 NOTE — Op Note (Signed)
Preoperative diagnosis:  Urinary retention Inability to place Foley catheter  Postoperative diagnosis:  Same  Procedure: Cystoscopic guided complex catheter placement (16109)  Surgeon: Abbie Sons, MD  Anesthesia: Spinal  Complications: None  Intraoperative findings: Narrowing bulbar urethra which did not require dilation  EBL: None  Specimens: None  Indication: Manuel Klein is a 80 y.o. patient with a left hip fracture scheduled for a left hip hemiarthroplasty.  He complained of inability to void preop with bladder scan 350 mL.  Bedside Foley catheter placement was unsuccessful with resistance in the area of the prostatic/bulbous urethra.  Catheter placement recommended after anesthesia prior to his orthopedic surgery.  After reviewing the management options for treatment, he elected to proceed with the above surgical procedure(s). We have discussed the potential benefits and risks of the procedure, side effects of the proposed treatment, the likelihood of the patient achieving the goals of the procedure, and any potential problems that might occur during the procedure or recuperation. Informed consent has been obtained.  Description of procedure:  The patient was taken to the operating room a spinal was placed by anesthesia on his hospital bed in the left lateral decubitus position.  He was then rolled back to the supine position, prepped and draped in the usual sterile fashion.  A preoperative time-out was performed.   A flexible cystoscope was lubricated, inserted per urethra and advanced proximally under direct vision.  Narrowing was noted in the bulbar urethra however in the cystoscope was negotiated past this area without dilation.  The prostatic urethra was normal in appearance.  There was mild lateral lobe enlargement.  The bladder neck was normal in appearance.  The bladder was distended with moderate trabeculation.  No solid or papillary lesions were identified.  A  0.038 Sensor wire was then placed through the cystoscope and advanced in the bladder.  The cystoscope was removed and a 16 French [place] catheter was placed over the wire without difficulty.  The balloon was inflated with 10 mL of sterile water and the catheter was placed to gravity drainage.  He will be transferred to the operating table, prepped and draped for his left hip hemiarthroplasty which will be dictated separately by Dr. Sabra Heck.  Plan: Recommend Foley catheter drainage 5-7-days   Abbie Sons, M.D.

## 2021-08-27 ENCOUNTER — Inpatient Hospital Stay
Admission: EM | Disposition: A | Discharge status: Skilled Nursing Facility | Payer: Self-pay | Source: Home / Self Care | Attending: Internal Medicine

## 2021-08-27 ENCOUNTER — Encounter: Payer: Self-pay | Admitting: Specialist

## 2021-08-27 ENCOUNTER — Other Ambulatory Visit (INDEPENDENT_AMBULATORY_CARE_PROVIDER_SITE_OTHER): Payer: Self-pay | Admitting: Vascular Surgery

## 2021-08-27 DIAGNOSIS — N186 End stage renal disease: Secondary | ICD-10-CM

## 2021-08-27 HISTORY — PX: DIALYSIS/PERMA CATHETER INSERTION: CATH118288

## 2021-08-27 LAB — GLUCOSE, CAPILLARY
Glucose-Capillary: 128 mg/dL — ABNORMAL HIGH (ref 70–99)
Glucose-Capillary: 110 mg/dL — ABNORMAL HIGH (ref 70–99)
Glucose-Capillary: 111 mg/dL — ABNORMAL HIGH (ref 70–99)
Glucose-Capillary: 106 mg/dL — ABNORMAL HIGH (ref 70–99)
Glucose-Capillary: 173 mg/dL — ABNORMAL HIGH (ref 70–99)

## 2021-08-27 LAB — HEMOGLOBIN A1C
Hgb A1c MFr Bld: 7.4 % — ABNORMAL HIGH (ref 4.8–5.6)
Mean Plasma Glucose: 166 mg/dL

## 2021-08-27 LAB — PATHOLOGIST SMEAR REVIEW

## 2021-08-27 SURGERY — DIALYSIS/PERMA CATHETER INSERTION
Anesthesia: Moderate Sedation | Laterality: Right

## 2021-08-27 MED ORDER — FENTANYL CITRATE (PF) 100 MCG/2ML IJ SOLN
INTRAMUSCULAR | Status: DC | PRN
  Administered 2021-08-27: 25 ug via INTRAVENOUS

## 2021-08-27 MED ORDER — PHENOL 1.4 % MT LIQD
1.0000 | OROMUCOSAL | Status: DC | PRN
  Filled 2021-08-27: qty 177

## 2021-08-27 MED ORDER — LIDOCAINE HCL (PF) 1 % IJ SOLN
5.0000 mL | INTRAMUSCULAR | Status: DC | PRN
  Filled 2021-08-27: qty 5

## 2021-08-27 MED ORDER — ALUM & MAG HYDROXIDE-SIMETH 200-200-20 MG/5ML PO SUSP: 30.0000 mL | ORAL | Status: DC | PRN

## 2021-08-27 MED ORDER — METHYLPREDNISOLONE SODIUM SUCC 125 MG IJ SOLR: 125.0000 mg | Freq: Once | INTRAMUSCULAR | Status: DC | PRN

## 2021-08-27 MED ORDER — SODIUM CHLORIDE 0.9 % IV SOLN: INTRAVENOUS | Status: DC

## 2021-08-27 MED ORDER — SODIUM CHLORIDE 0.9 % IV SOLN: 100.0000 mL | INTRAVENOUS | Status: DC | PRN

## 2021-08-27 MED ORDER — PENTAFLUOROPROP-TETRAFLUOROETH EX AERO
1.0000 "application " | INHALATION_SPRAY | CUTANEOUS | Status: DC | PRN
  Filled 2021-08-27: qty 30

## 2021-08-27 MED ORDER — MIDAZOLAM HCL 2 MG/2ML IJ SOLN
INTRAMUSCULAR | Status: AC
  Filled 2021-08-27: qty 2

## 2021-08-27 MED ORDER — CLINDAMYCIN PHOSPHATE 300 MG/50ML IV SOLN: 300.0000 mg | Freq: Once | INTRAVENOUS | Status: AC

## 2021-08-27 MED ORDER — ONDANSETRON HCL 4 MG/2ML IJ SOLN: 4.0000 mg | Freq: Four times a day (QID) | INTRAMUSCULAR | Status: DC | PRN

## 2021-08-27 MED ORDER — FAMOTIDINE 20 MG PO TABS: 40.0000 mg | ORAL_TABLET | Freq: Once | ORAL | Status: DC | PRN

## 2021-08-27 MED ORDER — CLINDAMYCIN PHOSPHATE 300 MG/50ML IV SOLN
INTRAVENOUS | Status: AC
  Filled 2021-08-27: qty 50

## 2021-08-27 MED ORDER — MENTHOL 3 MG MT LOZG
1.0000 | LOZENGE | OROMUCOSAL | Status: DC | PRN
  Filled 2021-08-27: qty 9

## 2021-08-27 MED ORDER — FENTANYL CITRATE (PF) 100 MCG/2ML IJ SOLN
INTRAMUSCULAR | Status: AC
  Filled 2021-08-27: qty 2

## 2021-08-27 MED ORDER — DIPHENHYDRAMINE HCL 50 MG/ML IJ SOLN: 50.0000 mg | Freq: Once | INTRAMUSCULAR | Status: DC | PRN

## 2021-08-27 MED ORDER — HYDROMORPHONE HCL 1 MG/ML IJ SOLN
1.0000 mg | Freq: Once | INTRAMUSCULAR | Status: DC | PRN
  Filled 2021-08-27: qty 1

## 2021-08-27 MED ORDER — MIDAZOLAM HCL 2 MG/2ML IJ SOLN
INTRAMUSCULAR | Status: DC | PRN
  Administered 2021-08-27: 1 mg via INTRAVENOUS

## 2021-08-27 MED ORDER — ZOLPIDEM TARTRATE 5 MG PO TABS: 5.0000 mg | ORAL_TABLET | Freq: Every evening | ORAL | Status: DC | PRN

## 2021-08-27 MED ORDER — HEPARIN SODIUM (PORCINE) 1000 UNIT/ML DIALYSIS
1000.0000 [IU] | INTRAMUSCULAR | Status: DC | PRN
  Filled 2021-08-27: qty 1

## 2021-08-27 MED ORDER — MIDAZOLAM HCL 2 MG/ML PO SYRP: 8.0000 mg | ORAL_SOLUTION | Freq: Once | ORAL | Status: DC | PRN

## 2021-08-27 MED ORDER — TRANEXAMIC ACID-NACL 1000-0.7 MG/100ML-% IV SOLN
1000.0000 mg | Freq: Once | INTRAVENOUS | Status: DC
  Filled 2021-08-27: qty 100

## 2021-08-27 MED ORDER — ALTEPLASE 2 MG IJ SOLR
2.0000 mg | Freq: Once | INTRAMUSCULAR | Status: DC | PRN
  Filled 2021-08-27: qty 2

## 2021-08-27 MED ORDER — CHLORHEXIDINE GLUCONATE CLOTH 2 % EX PADS
6.0000 | MEDICATED_PAD | Freq: Every day | CUTANEOUS | Status: DC
  Administered 2021-08-27 – 2021-08-30 (×4): 6 via TOPICAL

## 2021-08-27 SURGICAL SUPPLY — 8 items
BIOPATCH RED 1 DISK 7.0 (GAUZE/BANDAGES/DRESSINGS) ×1 IMPLANT
CATH CANNON HEMO 15FR 19 (HEMODIALYSIS SUPPLIES) ×1 IMPLANT
COVER PROBE U/S 5X48 (MISCELLANEOUS) ×1 IMPLANT
DERMABOND ADVANCED (GAUZE/BANDAGES/DRESSINGS) ×1
DERMABOND ADVANCED .7 DNX12 (GAUZE/BANDAGES/DRESSINGS) IMPLANT
PACK ANGIOGRAPHY (CUSTOM PROCEDURE TRAY) ×1 IMPLANT
SUT MNCRL AB 4-0 PS2 18 (SUTURE) ×1 IMPLANT
SUT PROLENE 0 CT 1 30 (SUTURE) ×1 IMPLANT

## 2021-08-27 NOTE — Interval H&P Note (Signed)
History and Physical Interval Note:  08/27/2021 4:31 PM  Manuel Klein  has presented today for surgery, with the diagnosis of ESRD.  The various methods of treatment have been discussed with the patient and family. After consideration of risks, benefits and other options for treatment, the patient has consented to  Procedure(s): DIALYSIS/PERMA CATHETER INSERTION (Right) as a surgical intervention.  The patient's history has been reviewed, patient examined, no change in status, stable for surgery.  I have reviewed the patient's chart and labs.  Questions were answered to the patient's satisfaction.     Leotis Pain

## 2021-08-27 NOTE — NC FL2 (Signed)
[place] LEVEL OF CARE SCREENING TOOL     IDENTIFICATION  Patient Name: Manuel Klein Birthdate: Feb 25, 1941 Sex: male Admission Date (Current Location): 08/25/2021  [facility] and [place] Number:  Engineering geologist and Address:  [facility], [address], [place], [place] [address]      Provider Number: [id number]  Attending Physician Name and Address:  Fritzi Mandes, MD  Relative Name and Phone Number:  [name] wife [phone]    Current Level of Care: Hospital Recommended Level of Care: [place] Prior Approval Number:    Date Approved/Denied:   PASRR Number: [id number]  Discharge Plan: SNF    Current Diagnoses: Patient Active Problem List   Diagnosis Date Noted   Fall    Closed left hip fracture ([place]) 08/25/2021   Protein-calorie malnutrition, severe 05/15/2021   Hypokalemia    ESRD (end stage renal disease) (HCC)    Chronic diastolic CHF (congestive heart failure) (HCC)    Absolute anemia 05/14/2021   Coronary artery disease    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[place]) 05/11/2021   Symptomatic anemia 05/11/2021   Hyperlipidemia 11/03/2017   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[place]) 11/03/2017   Primary hypertension 11/03/2017    Orientation RESPIRATION BLADDER Height & Weight     Self, Time, Situation, Place  O2 (2 liters) Continent, Indwelling catheter Weight: 80 kg Height:  5\' 9"  (175.3 cm)  BEHAVIORAL SYMPTOMS/MOOD NEUROLOGICAL BOWEL NUTRITION STATUS      Continent Diet (regular)  AMBULATORY STATUS COMMUNICATION OF NEEDS Skin   Extensive Assist Verbally Normal, Surgical wounds                       Personal Care Assistance Level of Assistance  Bathing, Feeding, Dressing Bathing Assistance: Limited assistance Feeding assistance: Limited assistance Dressing Assistance: Maximum assistance     Functional Limitations Info              SPECIAL CARE FACTORS FREQUENCY  PT (By licensed PT), OT (By licensed OT)     PT Frequency: 5 times per week OT Frequency: 5 times per week            Contractures Contractures Info: Not present    Additional Factors Info  Code Status, Allergies Code Status Info: DNR Allergies Info: Hydralazine, Atorvastatin, Carvedilol, Ciprofloxacin, Other, Penicillins, Sulfa Antibiotics, Sulfasalazine           Current Medications (08/27/2021):  This is the current hospital active medication list Current Facility-Administered Medications  Medication Dose Route Frequency Provider Last Rate Last Admin   0.9 %  sodium chloride infusion  250 mL Intravenous PRN Earnestine Leys, MD 1 mL/hr at 08/26/21 1720 Rate Change at 08/26/21 1720   acetaminophen (TYLENOL) tablet 650 mg  650 mg Oral Q6H PRN Earnestine Leys, MD       Or   acetaminophen (TYLENOL) suppository 650 mg  650 mg Rectal Q6H PRN Earnestine Leys, MD       albuterol (PROVENTIL) (2.5 MG/3ML) 0.083% nebulizer solution 2.5 mg  2.5 mg Nebulization Q4H PRN Earnestine Leys, MD       alum & mag hydroxide-simeth (MAALOX/MYLANTA) 200-200-20 MG/5ML suspension 30 mL  30 mL Oral Q4H PRN Earnestine Leys, MD       amLODipine (NORVASC) tablet 5 mg  5 mg Oral Daily Earnestine Leys, MD   5 mg at 08/27/21 4944   aspirin EC tablet 325 mg  325  mg Oral Q breakfast Earnestine Leys, MD   325 mg at 08/27/21 3220   bisacodyl (DULCOLAX) suppository 10 mg  10 mg Rectal Daily PRN Earnestine Leys, MD       ceFAZolin (ANCEF) IVPB 1 g/50 mL premix  1 g Intravenous Lajuan Lines, MD 100 mL/hr at 08/27/21 0718 1 g at 08/27/21 2542   Chlorhexidine Gluconate Cloth 2 % PADS 6 each  6 each Topical Daily Fritzi Mandes, MD   6 each at 08/27/21 1027   clindamycin (CLEOCIN) IVPB 600 mg  600 mg Intravenous Lajuan Lines, MD 100 mL/hr at 08/27/21 0613 600 mg at 08/27/21 7062   docusate sodium (COLACE) capsule 100 mg  100 mg Oral BID Earnestine Leys, MD   100 mg at 08/27/21 3762    ferrous sulfate tablet 325 mg  325 mg Oral Q breakfast Earnestine Leys, MD   325 mg at 08/27/21 8315   furosemide (LASIX) tablet 120 mg  120 mg Oral Daily Earnestine Leys, MD   120 mg at 08/27/21 1761   hydrALAZINE (APRESOLINE) tablet 25 mg  25 mg Oral BID Earnestine Leys, MD   25 mg at 08/27/21 0931   HYDROcodone-acetaminophen (NORCO/VICODIN) 5-325 MG per tablet 1-2 tablet  1-2 tablet Oral Q4H PRN Earnestine Leys, MD   2 tablet at 08/27/21 0153   insulin aspart (novoLOG) injection 0-5 Units  0-5 Units Subcutaneous QHS Earnestine Leys, MD       insulin aspart (novoLOG) injection 0-9 Units  0-9 Units Subcutaneous TID [facility] Earnestine Leys, MD   2 Units at 08/26/21 1217   levothyroxine (SYNTHROID) tablet 100 mcg  100 mcg Oral Q0600 Earnestine Leys, MD   100 mcg at 08/27/21 6073   losartan (COZAAR) tablet 100 mg  100 mg Oral Daily Earnestine Leys, MD   100 mg at 08/27/21 0930   magnesium hydroxide (MILK OF MAGNESIA) suspension 30 mL  30 mL Oral Daily PRN Earnestine Leys, MD       magnesium oxide (MAG-OX) tablet 400 mg  400 mg Oral Daily Earnestine Leys, MD   400 mg at 08/27/21 7106   menthol-cetylpyridinium (CEPACOL) lozenge 3 mg  1 lozenge Oral PRN Earnestine Leys, MD       Or   phenol (CHLORASEPTIC) mouth spray 1 spray  1 spray Mouth/Throat PRN Earnestine Leys, MD       methocarbamol (ROBAXIN) tablet 500 mg  500 mg Oral Q6H PRN Earnestine Leys, MD       Or   methocarbamol (ROBAXIN) 500 mg in dextrose 5 % 50 mL IVPB  500 mg Intravenous Q6H PRN Earnestine Leys, MD       metoCLOPramide (REGLAN) tablet 5-10 mg  5-10 mg Oral Q8H PRN Earnestine Leys, MD       Or   metoCLOPramide (REGLAN) injection 5-10 mg  5-10 mg Intravenous Q8H PRN Earnestine Leys, MD       metoprolol tartrate (LOPRESSOR) tablet 25 mg  25 mg Oral BID Earnestine Leys, MD   25 mg at 08/27/21 0931   morphine 2 MG/ML injection 0.5-1 mg  0.5-1 mg Intravenous Q2H PRN Earnestine Leys, MD       morphine 2 MG/ML injection 2 mg  2 mg Intravenous Q2H PRN Earnestine Leys, MD   2 mg at 08/26/21 2258   multivitamin (RENA-VIT) tablet 1 tablet  1 tablet Oral Daily Earnestine Leys, MD   1 tablet at 08/27/21 0929   mupirocin ointment (BACTROBAN) 2 % 1 application  1 application Nasal  BID Earnestine Leys, MD   1 application at 12/87/86 0933   ondansetron [facility]) tablet 4 mg  4 mg Oral Q6H PRN Earnestine Leys, MD       Or   ondansetron [facility]) injection 4 mg  4 mg Intravenous Q6H PRN Earnestine Leys, MD   4 mg at 08/26/21 1724   pantoprazole (PROTONIX) EC tablet 40 mg  40 mg Oral BID Earnestine Leys, MD   40 mg at 08/27/21 7672   rosuvastatin (CRESTOR) tablet 10 mg  10 mg Oral Daily Earnestine Leys, MD   10 mg at 08/26/21 0813   sevelamer carbonate (RENVELA) tablet 800 mg  800 mg Oral BID WC Earnestine Leys, MD   800 mg at 08/27/21 0930   sodium chloride flush (NS) 0.9 % injection 3 mL  3 mL Intravenous Q12H Earnestine Leys, MD   3 mL at 08/27/21 0933   sodium chloride flush (NS) 0.9 % injection 3 mL  3 mL Intravenous PRN Earnestine Leys, MD       sodium phosphate (FLEET) 7-19 GM/118ML enema 1 enema  1 enema Rectal Once PRN Earnestine Leys, MD       tamsulosin [facility]) capsule 0.4 mg  0.4 mg Oral Daily Earnestine Leys, MD   0.4 mg at 08/27/21 0930   tranexamic acid (CYKLOKAPRON) IVPB 1,000 mg  1,000 mg Intravenous Once Earnestine Leys, MD       traZODone (DESYREL) tablet 25 mg  25 mg Oral QHS PRN Earnestine Leys, MD       vitamin B-12 (CYANOCOBALAMIN) tablet 5,000 mcg  5,000 mcg Oral Daily Earnestine Leys, MD   5,000 mcg at 08/27/21 0947   zolpidem (AMBIEN) tablet 5 mg  5 mg Oral QHS PRN Earnestine Leys, MD         Discharge Medications: Please see discharge summary for a list of discharge medications.  Relevant Imaging Results:  Relevant Lab Results:   Additional Information SS# [id number]  Conception Oms, RN

## 2021-08-27 NOTE — Progress Notes (Signed)
Patient returned to unit from PACU around 1940. Patient was alert and oriented x 4. Left lower extremity was assessed and placed in [place] [employer] per orders. Dressing clean dry and intact, ice pack applied to site. Reports that he could feel the sensation coming back to the extremity. Complained of break through pain and was provided prn pain medications for relief. Call received from ultrasound to inform that patient was has orders for a Renal US, the tech was informed of patient current post op status and was infomed that staff would follow up in the morning to determine if he still needs the [place] since he is pending Permacath placement.  Consent obtained for the procedure and NPO since midnight. Foley output is pink tinged, family was informed that the bleeding is minimum and likely from the multiple attempts to place the catheter. VS stable and no respiratory distress on room air. Wife at bedside. Will continue to monitor.

## 2021-08-27 NOTE — Progress Notes (Signed)
Subjective: 1 Day Post-Op Procedure(s) (LRB): ARTHROPLASTY UNIPOLAR  HIP (HEMIARTHROPLASTY) (Left) CYSTOSCOPY   Patient is doing well in regards to his left hip surgery yesterday.  Pain is tolerable.  He has been out of bed.  Vascular surgery is planning on doing a permanent catheter to allow for hemodialysis later today.  Patient reports pain as mild.  Objective:   VITALS:   Vitals:   08/27/21 0816 08/27/21 0953  BP: 136/63 (!) 149/67  Pulse: 71 69  Resp: 14   Temp: 98.1 F (36.7 C) 98.1 F (36.7 C)  SpO2: 93% 99%    Incision: dressing C/D/I Hip is stable.  Mild pain with range of motion.  LABS Recent Labs    08/25/21 2228 08/26/21 0453  HGB 11.0* 9.6*  HCT 32.9* 28.9*  WBC 16.3* 14.7*  PLT 270 221    Recent Labs    08/25/21 2228 08/26/21 0453  NA 138 139  K 3.5 3.4*  BUN 67* 69*  CREATININE 7.67* 7.53*  GLUCOSE 199* 192*    Recent Labs    08/25/21 2228  INR 1.1     Assessment/Plan: 1 Day Post-Op Procedure(s) (LRB): ARTHROPLASTY UNIPOLAR  HIP (HEMIARTHROPLASTY) (Left) CYSTOSCOPY   Advance diet Up with therapy Discharge to SNF

## 2021-08-27 NOTE — TOC Progression Note (Signed)
Transition of Care [facility]) - Progression Note    Patient Details  Name: Manuel Klein MRN: [id number] Date of Birth: [birth year]  Transition of Care [facility]) CM/SW [place], RN Phone Number: 08/27/2021, 3:43 PM  Clinical Narrative:    Received a bed offer from  [facility], the patient accepted the bed offer, will get a chair time set up at [facility], anticipate DC possible Wednesday   Expected Discharge Plan: [place] Barriers to Discharge: Continued Medical Work up  Expected Discharge Plan and Services Expected Discharge Plan: [place] Choice: Resumption of Svcs/PTA Provider Living arrangements for the past 2 months: [place] Agency: [place] Date [place]: 08/26/21   Representative spoke with at [place]: [place] ([place]) Interventions    Readmission Risk Interventions Readmission Risk Prevention Plan 08/26/2021  Transportation Screening Complete  PCP or Specialist Appt within 3-5 Days Complete  HRI or Home Care Consult Complete  Social Work Consult for [place] Planning/Counseling Complete  Palliative Care Screening Not Applicable  Medication Review Press photographer) Complete  Some recent data might be hidden

## 2021-08-27 NOTE — Progress Notes (Signed)
[place] [facility] [place] NAME: Manuel Klein    MR#:  [id number]  DATE OF BIRTH:  [birth year]  SUBJECTIVE:  patient came in after mechanical fall at home he slipped in his bathroom. Has a fracture. Difficulty urinating today. History of BPH. Wife at bedside.   POD #1 working with PT. NPO for [facility] cath placement  REVIEW OF SYSTEMS:   Review of Systems  Constitutional:  Negative for chills, fever and weight loss.  HENT:  Negative for ear discharge, ear pain and nosebleeds.   Eyes:  Negative for blurred vision, pain and discharge.  Respiratory:  Negative for sputum production, shortness of breath, wheezing and stridor.   Cardiovascular:  Negative for chest pain, palpitations, orthopnea and PND.  Gastrointestinal:  Negative for abdominal pain, diarrhea, nausea and vomiting.  Genitourinary:  Negative for frequency and urgency.  Musculoskeletal:  Positive for joint pain. Negative for back pain.  Neurological:  Positive for weakness. Negative for sensory change, speech change and focal weakness.  Psychiatric/Behavioral:  Negative for depression and hallucinations. The patient is not nervous/anxious.   Tolerating Diet:npo Tolerating PT: Rehab  DRUG ALLERGIES:   Allergies  Allergen Reactions   Hydralazine     Other reaction(s): Dizziness, Muscle Pain Chest pain   Atorvastatin     Other reaction(s): Muscle Pain   Carvedilol Other (See Comments)    Dizziness, drunk feeling   Ciprofloxacin Rash   Other Rash   Penicillins Rash and Other (See Comments)    Has patient had a PCN reaction causing immediate rash, facial/tongue/throat swelling, SOB or lightheadedness with hypotension: No Has patient had a PCN reaction causing severe rash involving mucus membranes or skin necrosis: No Has patient had a PCN reaction that required hospitalization: No Has patient had a PCN reaction occurring within the last 10 years: No If all of the above answers are "NO",  then may proceed with Cephalosporin use.    Sulfa Antibiotics Rash   Sulfasalazine Rash    VITALS:  Blood pressure (!) 149/67, pulse 69, temperature 98.1 F (36.7 C), temperature source Oral, resp. rate 14, height 5\' 9"  (1.753 m), weight 80 kg, SpO2 99 %.  PHYSICAL EXAMINATION:   Physical Exam  GENERAL:  80 y.o.-year-old patient lying in the bed with no acute distress.  LUNGS: Normal breath sounds bilaterally, no wheezing, rales, rhonchi. No use of accessory muscles of respiration.  CARDIOVASCULAR: S1, S2 normal. No murmurs, rubs, or gallops.  ABDOMEN: Soft, nontender, nondistended. Bowel sounds present. No organomegaly or mass. Foley+, PD cath+ EXTREMITIES: No cyanosis, clubbing or edema b/l.   Left lower Extremity decreased ROM NEUROLOGIC: Cranial nerves II through XII are intact. No focal Motor or sensory deficits b/l.   PSYCHIATRIC:  patient is alert and oriented x 3.  SKIN: No obvious rash, lesion, or ulcer.   LABORATORY PANEL:  CBC Recent Labs  Lab 08/26/21 0453  WBC 14.7*  HGB 9.6*  HCT 28.9*  PLT 221     Chemistries  Recent Labs  Lab 08/26/21 0453  NA 139  K 3.4*  CL 102  CO2 24  GLUCOSE 192*  BUN 69*  CREATININE 7.53*  CALCIUM 7.7*    Cardiac Enzymes No results for input(s): TROPONINI in the last 168 hours. RADIOLOGY:  DG Chest Portable 1 View  Result Date: 08/25/2021 CLINICAL DATA:  No loc, slipped in shower, no hit head, no thinners, co of left leg pain, htn , swelling to left leg EXAM: PORTABLE  CHEST 1 VIEW COMPARISON:  Chest x-ray 06/04/2019 FINDINGS: The heart and mediastinal contours are within normal limits. Aortic valve replacement. Atrial appendage clip. Aortic calcification. Approximately 4 cm right paramediastinal upper lung airspace opacity. No pulmonary edema. At least trace to small volume left pleural effusion. No right pleural effusion. No pneumothorax. No acute osseous abnormality.Intact appearing sternotomy wires. IMPRESSION: 1.  Approximately 4 cm right paramediastinal upper lung airspace opacity. 2. At least trace to small volume left pleural effusion. 3. Recommend CT chest with intravenous contrast (not CTPA) for further evaluation. Electronically Signed   By: Iven Finn M.D.   On: 08/25/2021 23:32   DG Hip Port Unilat With Pelvis 1V Left  Result Date: 08/26/2021 CLINICAL DATA:  Postop left hemiarthroplasty EXAM: DG HIP (WITH OR WITHOUT PELVIS) 1V PORT LEFT COMPARISON:  08/25/2021 FINDINGS: Left hip replacement. Normal alignment. No hardware complicating feature. IMPRESSION: Left hip replacement.  No visible complicating feature. Electronically Signed   By: Rolm Baptise M.D.   On: 08/26/2021 19:14   DG Femur Min 2 Views Left  Result Date: 08/25/2021 CLINICAL DATA:  Pain after slip and fall injury. EXAM: LEFT FEMUR 2 VIEWS COMPARISON:  11/22/2020 FINDINGS: Acute transverse fracture of the left femoral neck with varus angulation of the fracture fragments. No dislocation at the hip joint. Visualized left hemipelvis appears intact. Midshaft and distal femur is intact. No dislocation at the knee joint. Extensive vascular calcifications in the soft tissues. Catheter coiled in the pelvis likely represents a peritoneal dialysis catheter. IMPRESSION: Acute transverse fracture of the left femoral neck with varus angulation of the fracture fragments. Electronically Signed   By: Lucienne Capers M.D.   On: 08/25/2021 23:02   ASSESSMENT AND PLAN:  Manuel Klein is a 80 y.o. Caucasian male with medical history significant for end-stage renal disease on peritoneal dialysis, CHF, coronary artery disease, type diabetes mellitus, hypertension, dyslipidemia, GERD and asthma, who presented to the ER with acute onset of accidental mechanical fall on his left side, slipping in the shower with subsequent left hip pain and inability to get up.     Closed left hip/femoral neck fracture secondary to mechanical fall. - prn Pain meds - Aspirin  on hold - Orthopedic consultation with  Dr. Sabra Heck  -10/10--POD #1 hip surgery. PT inititiaded   End-stage renal disease on peritoneal dialysis. - Nephrology consultation with Dr Raliegh Ip - patient is on peritoneal dialysis. Will switch him to hemodialysis perioperative and for rehab purposes. Vascular consultation has been placed --10/10--pt to get perm cath   Essential hypertension. -  continue his antihypertensives  Type 2 diabetes mellitus with HL - SSI  BPH acute urinary retention - continue Flomax. -- Urology consultation with Dr. Bernardo Heater for acute urinary retention and inability place catheter. Dr. Bernardo Heater recommends Foley catheter placement under anesthesia cystoscopy and possible urethral dilatation --Per Dr Karren Burly foley in for 5-7 days. Pt had Bulbar urethral narrowing requiring to place Foley under anesthesia. No dilation needed   Hypothyroidism. - continue Synthroid.  Dyslipidemia. - continue statin therapy.  GERD. -  continue PPI therapy.  Asthma without exacerbation. --as needed oxygen  Procedures: Left   hip hemiarthroplasty --10/9 Family communication :wife at bedside Consults :ortho,urology CODE STATUS: DNR DVT Prophylaxis :SCD Level of care: Med-Surg Status is: Inpatient  Remains inpatient appropriate because:IV treatments appropriate due to intensity of illness or inability to take PO  Dispo: The patient is from: Home  Anticipated d/c is to: SNF              Patient currently is not medically stable to d/c.   Difficult to place patient No        TOTAL TIME TAKING CARE OF THIS PATIENT: 25 minutes.  >50% time spent on counselling and coordination of care  Note: This dictation was prepared with Dragon dictation along with smaller phrase technology. Any transcriptional errors that result from this process are unintentional.  Fritzi Mandes M.D    [facility]   CC: Primary care physician; Lynnell Jude, MD Patient ID: Manuel Klein, male   DOB: 07-Aug-1941, 80 y.o.   MRN: [id number]

## 2021-08-27 NOTE — Op Note (Signed)
OPERATIVE NOTE    PRE-OPERATIVE DIAGNOSIS: 1. ESRD   POST-OPERATIVE DIAGNOSIS: same as above  PROCEDURE: Ultrasound guidance for vascular access to the right internal jugular vein Fluoroscopic guidance for placement of catheter Placement of a 19 cm tip to cuff tunneled hemodialysis catheter via the right internal jugular vein  SURGEON: Leotis Pain, MD  ANESTHESIA:  Local with Moderate conscious sedation for approximately 14 minutes using 1 mg of Versed and 25 mcg of Fentanyl  ESTIMATED BLOOD LOSS: 5 cc  FLUORO TIME: less than one minute  CONTRAST: none  FINDING(S): 1.  Patent right internal jugular vein  SPECIMEN(S):  None  INDICATIONS:   Manuel Klein is a 80 y.o.male who presents with renal failure.  The patient needs long term dialysis access for their ESRD, and a Permcath is necessary.  Risks and benefits are discussed and informed consent is obtained.    DESCRIPTION: After obtaining full informed written consent, the patient was brought back to the vascular suited. The patient's right neck and chest were sterilely prepped and draped in a sterile surgical field was created. Moderate conscious sedation was administered during a face to face encounter with the patient throughout the procedure with my supervision of the RN administering medicines and monitoring the patient's vital signs, pulse oximetry, telemetry and mental status throughout from the start of the procedure until the patient was taken to the recovery room.  The right internal jugular vein was visualized with ultrasound and found to be patent. It was then accessed under direct ultrasound guidance and a permanent image was recorded. A wire was placed. After skin nick and dilatation, the peel-away sheath was placed over the wire. I then turned my attention to an area under the clavicle. Approximately 1-2 fingerbreadths below the clavicle a small counterincision was created and tunneled from the subclavicular incision to  the access site. Using fluoroscopic guidance, a 19 centimeter tip to cuff tunneled hemodialysis catheter was selected, and tunneled from the subclavicular incision to the access site. It was then placed through the peel-away sheath and the peel-away sheath was removed. Using fluoroscopic guidance the catheter tips were parked in the right atrium. The appropriate distal connectors were placed. It withdrew blood well and flushed easily with heparinized saline and a concentrated heparin solution was then placed. It was secured to the chest wall with 2 Prolene sutures. The access incision was closed single 4-0 Monocryl. A 4-0 Monocryl pursestring suture was placed around the exit site. Sterile dressings were placed. The patient tolerated the procedure well and was taken to the recovery room in stable condition.  COMPLICATIONS: None  CONDITION: Stable  Leotis Pain, MD 08/27/2021 5:16 PM   This note was created with Dragon Medical transcription system. Any errors in dictation are purely unintentional.

## 2021-08-27 NOTE — Evaluation (Signed)
Physical Therapy Evaluation Patient Details Name: Manuel Klein MRN: [id number] DOB: 1941/06/28 Today's Date: 08/27/2021  History of Present Illness  Pt is a 80 y.o. male presenting to hospital 10/8 with L leg pain s/p fall in shower.  Pt admitted with closed L hip/femoral neck fx secondary to mechanical fall.  S/p L hip hemiarthroplasty secondary to displaced subcapital fx of L hip 08/26/2021.  PMH includes asthma, CHF, CKD stage IV, CAD, DM, htn, MI, sz's, ESRD on PD, symptomatic anemia.  Clinical Impression  Prior to hospital admission, pt was modified independent ambulating with quad cane; has been receiving HHPT for balance (since heart surgery in December); and lives with his wife in 1 level home with steps to enter.  Currently pt is 1-2 assist with bed mobility and min to mod assist x2 to stand (x3 trials) from elevated bed.  Pain L hip increased with activity (0/10 at rest beginning of session and 4-5/10 end of session at rest).  Pt would benefit from skilled PT to address noted impairments and functional limitations (see below for any additional details).  Upon hospital discharge, pt would benefit from SNF.       Recommendations for follow up therapy are one component of a multi-disciplinary discharge planning process, led by the attending physician.  Recommendations may be updated based on patient status, additional functional criteria and insurance authorization.  Follow Up Recommendations SNF    Equipment Recommendations  Rolling walker with 5" wheels;3in1 (PT)    Recommendations for Other Services OT consult     Precautions / Restrictions Precautions Precautions: Fall;Posterior Hip Precaution Booklet Issued: Yes (comment) Precaution Comments: Foley catheter; NPO; peritoneal dialysis site Restrictions Weight Bearing Restrictions: Yes LLE Weight Bearing: Partial weight bearing      Mobility  Bed Mobility Overal bed mobility: Needs Assistance Bed Mobility: Supine to  Sit;Sit to Supine     Supine to sit: Mod assist;Max assist;HOB elevated Sit to supine: +2 for physical assistance;HOB elevated   General bed mobility comments: assist for trunk and L>R LE; vc's for technique    Transfers Overall transfer level: Needs assistance Equipment used: Rolling walker (2 wheeled) Transfers: Sit to/from Stand Sit to Stand: Min assist;Mod assist;From elevated surface;+2 physical assistance         General transfer comment: unable to stand pt from elevated bed and 1 assist (x2 trials); able to stand from elevated bed x3 trials with min to mod assist x2; vc's for UE/LE placement and overall technique  Ambulation/Gait             General Gait Details: pt unable to take any steps with 2 assist  Stairs            Wheelchair Mobility    Modified Rankin (Stroke Patients Only)       Balance Overall balance assessment: Needs assistance Sitting-balance support: No upper extremity supported;Feet supported Sitting balance-Leahy Scale: Fair Sitting balance - Comments: steady static sitting   Standing balance support: Bilateral upper extremity supported Standing balance-Leahy Scale: Poor Standing balance comment: pt requiring min assist x2 for static standing balance (vc's also required for upright posture); use of RW                             Pertinent Vitals/Pain Pain Assessment: 0-10 Pain Score: 5  Pain Location: L hip/thigh Pain Descriptors / Indicators: Aching;Sore;Tender Pain Intervention(s): Limited activity within patient's tolerance;Monitored during session;Repositioned;Patient requesting pain meds-RN notified Vitals (HR  and O2 on room air) stable and WFL throughout treatment session.    Home Living Family/patient expects to be discharged to:: Private residence Living Arrangements: Spouse/significant other Available Help at Discharge: Family;Available 24 hours/day Type of Home: House Home Access: Stairs to  enter Entrance Stairs-Rails: Right Entrance Stairs-Number of Steps: 4 steps with R railing plus 1 step no railing (through doorway) Home Layout: One level Home Equipment: Cane - quad      Prior Function Level of Independence: Independent with assistive device(s)         Comments: Ambulatory with quad cane.  Has had HHPT for balance since heart surgery in December 2021.     Hand Dominance        Extremity/Trunk Assessment   Upper Extremity Assessment Upper Extremity Assessment: Defer to OT evaluation    Lower Extremity Assessment Lower Extremity Assessment: LLE deficits/detail LLE Deficits / Details: at least 3/5 AROM ankle DF/PF; fair L quad set strength LLE: Unable to fully assess due to pain    Cervical / Trunk Assessment Cervical / Trunk Assessment: Normal  Communication   Communication: No difficulties  Cognition Arousal/Alertness: Awake/alert Behavior During Therapy: WFL for tasks assessed/performed Overall Cognitive Status: Within Functional Limits for tasks assessed                                 General Comments: Pt requiring increased time to respond at times      General Comments General comments (skin integrity, edema, etc.): dressing to L hip.  Nursing cleared pt for participation in physical therapy.  Pt agreeable to PT session.  Pt's wife present during session.    Exercises Total Joint Exercises Ankle Circles/Pumps: AROM;Strengthening;Both;10 reps;Supine Quad Sets: AROM;Strengthening;Both;10 reps;Supine Heel Slides: AAROM;Strengthening;Left;10 reps;Supine (minimal L hip flexion ROM d/t pain) Hip ABduction/ADduction: AAROM;Strengthening;Left;10 reps;Supine (minimal L hip aBduction ROM d/t pain)   Assessment/Plan    PT Assessment Patient needs continued PT services  PT Problem List Decreased strength;Decreased range of motion;Decreased activity tolerance;Decreased balance;Decreased mobility;Decreased knowledge of use of  DME;Decreased knowledge of precautions;Pain;Decreased skin integrity       PT Treatment Interventions DME instruction;Gait training;Stair training;Functional mobility training;Therapeutic activities;Therapeutic exercise;Balance training;Patient/family education    PT Goals (Current goals can be found in the Care Plan section)  Acute Rehab PT Goals Patient Stated Goal: to improve pain and mobility PT Goal Formulation: With patient Time For Goal Achievement: 09/10/21 Potential to Achieve Goals: Good    Frequency 7X/week   Barriers to discharge Decreased caregiver support      Co-evaluation               AM-PAC PT "6 Clicks" Mobility  Outcome Measure Help needed turning from your back to your side while in a flat bed without using bedrails?: A Little Help needed moving from lying on your back to sitting on the side of a flat bed without using bedrails?: A Lot Help needed moving to and from a bed to a chair (including a wheelchair)?: Total Help needed standing up from a chair using your arms (e.g., wheelchair or bedside chair)?: Total Help needed to walk in hospital room?: Total Help needed climbing 3-5 steps with a railing? : Total 6 Click Score: 9    End of Session Equipment Utilized During Treatment: Gait belt Activity Tolerance: Patient limited by pain;Patient limited by fatigue Patient left: in bed;with call bell/phone within reach;with bed alarm set;with family/visitor present;with SCD's reapplied;Other (comment) (  L LE buck's traction in place) Nurse Communication: Mobility status;Precautions;Weight bearing status;Patient requests pain meds PT Visit Diagnosis: Other abnormalities of gait and mobility (R26.89);Muscle weakness (generalized) (M62.81);History of falling (Z91.81);Pain Pain - Right/Left: Left Pain - part of body: Hip    Time: 5859-2924 PT Time Calculation (min) (ACUTE ONLY): 63 min   Charges:   PT Evaluation $PT Eval Low Complexity: 1 Low PT  Treatments $Therapeutic Exercise: 8-22 mins $Therapeutic Activity: 23-37 mins       Leitha Bleak, PT 08/27/21, 12:24 PM

## 2021-08-27 NOTE — Progress Notes (Signed)
OT Cancellation Note  Patient Details Name: Manuel Klein MRN: [id number] DOB: [birth year]   Cancelled Treatment:    Reason Eval/Treat Not Completed: Other (comment). OT order received and chart reviewed. Pt supine in bed with family member present in the room. Family member very upset and when therapist introduces herself she states, " Don't touch him because he is in pain. He has not had anything to eat and he can't take his pain medication on an empty stomach." OT acknowledged her frustrations she she refused evaluation for pt at this time. RN reports pt declined pain medication prior to therapist arrival.   Darleen Crocker, [place], OTR/L , [place] [phone]  08/27/21, 2:15 PM

## 2021-08-27 NOTE — Progress Notes (Signed)
[facility]  ROUNDING NOTE   Subjective:   [name] was admitted to [facility] on 08/25/2021 for Closed left hip fracture [facility]) [S72.002A] Closed fracture of left hip, initial encounter ([place]) [S72.002A] Fall, initial encounter [W19.XXXA]  Patient seen resting in bed Wife at bedside Currently NPO for permcath placement Denies shortness of breath at that time Patient seen later preparing to work with PT/OT Stated he has some shortness of breath  Objective:  Vital signs in last 24 hours:  Temp:  [97.2 F (36.2 C)-98.6 F (37 C)] 98.1 F (36.7 C) (10/10 0953) Pulse Rate:  [64-71] 69 (10/10 0953) Resp:  [9-20] 14 (10/10 0816) BP: (110-149)/(53-67) 149/67 (10/10 0953) SpO2:  [93 %-99 %] 99 % (10/10 0953)  Weight change:  Filed Weights   08/25/21 2214  Weight: 80 kg    Intake/Output: I/O last 3 completed shifts: In: 743.4 [I.V.:643.4; IV Piggyback:100] Out: 1200 [Urine:1150; Blood:50]   Intake/Output this shift:  No intake/output data recorded.  Physical Exam: General: NAD, laying in bed  Head: Normocephalic, atraumatic. Moist oral mucosal membranes  Eyes: Anicteric  Lungs:  Rhonchi and crackles bilaterally, [place] 2L  Heart: Regular rate and rhythm  Abdomen:  Soft, nontender  Extremities:  no peripheral edema. Left lower extremity in traction.   Neurologic: Nonfocal, moving all four extremities  Skin: No lesions  Access: PD catheter, clean dressings    Basic Metabolic Panel: Recent Labs  Lab 08/25/21 2228 08/26/21 0453  NA 138 139  K 3.5 3.4*  CL 100 102  CO2 25 24  GLUCOSE 199* 192*  BUN 67* 69*  CREATININE 7.67* 7.53*  CALCIUM 7.9* 7.7*     Liver Function Tests: No results for input(s): AST, ALT, ALKPHOS, BILITOT, PROT, ALBUMIN in the last 168 hours. No results for input(s): LIPASE, AMYLASE in the last 168 hours. No results for input(s): AMMONIA in the last 168 hours.  CBC: Recent Labs  Lab 08/25/21 2228 08/26/21 0453  WBC 16.3*  14.7*  NEUTROABS 10.1*  --   HGB 11.0* 9.6*  HCT 32.9* 28.9*  MCV 92.7 93.5  PLT 270 221     Cardiac Enzymes: No results for input(s): CKTOTAL, CKMB, CKMBINDEX, TROPONINI in the last 168 hours.  BNP: Invalid input(s): POCBNP  CBG: Recent Labs  Lab 08/26/21 1132 08/26/21 1325 08/26/21 1943 08/27/21 0813 08/27/21 1152  GLUCAP 150* 115* 138* 128* 110*     Microbiology: Results for orders placed or performed during the hospital encounter of 08/25/21  Resp Panel by RT-PCR (Flu A&B, Covid) Nasopharyngeal Swab     Status: None   Collection Time: 08/25/21 11:10 PM   Specimen: Nasopharyngeal Swab; Nasopharyngeal(NP) swabs in vial transport medium  Result Value Ref Range Status   SARS Coronavirus 2 by RT PCR NEGATIVE NEGATIVE Final    Comment: (NOTE) SARS-CoV-2 target nucleic acids are NOT DETECTED.  The SARS-CoV-2 RNA is generally detectable in upper respiratory specimens during the acute phase of infection. The lowest concentration of SARS-CoV-2 viral copies this assay can detect is 138 copies/mL. A negative result does not preclude SARS-Cov-2 infection and should not be used as the sole basis for treatment or other patient management decisions. A negative result may occur with  improper specimen collection/handling, submission of specimen other than nasopharyngeal swab, presence of viral mutation(s) within the areas targeted by this assay, and inadequate number of viral copies(<138 copies/mL). A negative result must be combined with clinical observations, patient history, and epidemiological information. The expected result is  Negative.  Fact Sheet for Patients:  EntrepreneurPulse.com.au  Fact Sheet for Healthcare Providers:  IncredibleEmployment.be  This test is no t yet approved or cleared by the [place] FDA and  has been authorized for detection and/or diagnosis of SARS-CoV-2 by FDA under an Emergency Use Authorization  (EUA). This EUA will remain  in effect (meaning this test can be used) for the duration of the COVID-19 declaration under Section 564(b)(1) of the Act, 21 U.S.C.section 360bbb-3(b)(1), unless the authorization is terminated  or revoked sooner.       Influenza A by PCR NEGATIVE NEGATIVE Final   Influenza B by PCR NEGATIVE NEGATIVE Final    Comment: (NOTE) The Xpert Xpress SARS-CoV-2/FLU/RSV plus assay is intended as an aid in the diagnosis of influenza from Nasopharyngeal swab specimens and should not be used as a sole basis for treatment. Nasal washings and aspirates are unacceptable for Xpert Xpress SARS-CoV-2/FLU/RSV testing.  Fact Sheet for Patients: EntrepreneurPulse.com.au  Fact Sheet for Healthcare Providers: IncredibleEmployment.be  This test is not yet approved or cleared by the [place] FDA and has been authorized for detection and/or diagnosis of SARS-CoV-2 by FDA under an Emergency Use Authorization (EUA). This EUA will remain in effect (meaning this test can be used) for the duration of the COVID-19 declaration under Section 564(b)(1) of the Act, 21 U.S.C. section 360bbb-3(b)(1), unless the authorization is terminated or revoked.  Performed at [facility], [address] [place], [place] [address]   Surgical PCR screen     Status: None   Collection Time: 08/26/21  4:27 AM   Specimen: Nasal Mucosa; Nasal Swab  Result Value Ref Range Status   MRSA, PCR NEGATIVE NEGATIVE Final   Staphylococcus aureus NEGATIVE NEGATIVE Final    Comment: (NOTE) The Xpert SA Assay (FDA approved for NASAL specimens in patients 75 years of age and older), is one component of a comprehensive surveillance program. It is not intended to diagnose infection nor to guide or monitor treatment. Performed at [facility], [address] [place], [place] [address]     Coagulation Studies: Recent Labs    08/25/21 2227/02/01   LABPROT 13.7  INR 1.1     Urinalysis: No results for input(s): COLORURINE, LABSPEC, PHURINE, GLUCOSEU, HGBUR, BILIRUBINUR, KETONESUR, PROTEINUR, UROBILINOGEN, NITRITE, LEUKOCYTESUR in the last 72 hours.  Invalid input(s): APPERANCEUR    Imaging: DG Chest Portable 1 View  Result Date: 08/25/2021 CLINICAL DATA:  No loc, slipped in shower, no hit head, no thinners, co of left leg pain, htn , swelling to left leg EXAM: PORTABLE CHEST 1 VIEW COMPARISON:  Chest x-ray 06/04/2019 FINDINGS: The heart and mediastinal contours are within normal limits. Aortic valve replacement. Atrial appendage clip. Aortic calcification. Approximately 4 cm right paramediastinal upper lung airspace opacity. No pulmonary edema. At least trace to small volume left pleural effusion. No right pleural effusion. No pneumothorax. No acute osseous abnormality.Intact appearing sternotomy wires. IMPRESSION: 1. Approximately 4 cm right paramediastinal upper lung airspace opacity. 2. At least trace to small volume left pleural effusion. 3. Recommend CT chest with intravenous contrast (not CTPA) for further evaluation. Electronically Signed   By: Iven Finn M.D.   On: 08/25/2021 23:32   DG Hip Port Unilat With Pelvis 1V Left  Result Date: 08/26/2021 CLINICAL DATA:  Postop left hemiarthroplasty EXAM: DG HIP (WITH OR WITHOUT PELVIS) 1V PORT LEFT COMPARISON:  08/25/2021 FINDINGS: Left hip replacement. Normal alignment. No hardware complicating feature. IMPRESSION: Left hip replacement.  No visible complicating feature.  Electronically Signed   By: Rolm Baptise M.D.   On: 08/26/2021 19:14   DG Femur Min 2 Views Left  Result Date: 08/25/2021 CLINICAL DATA:  Pain after slip and fall injury. EXAM: LEFT FEMUR 2 VIEWS COMPARISON:  11/22/2020 FINDINGS: Acute transverse fracture of the left femoral neck with varus angulation of the fracture fragments. No dislocation at the hip joint. Visualized left hemipelvis appears intact. Midshaft and  distal femur is intact. No dislocation at the knee joint. Extensive vascular calcifications in the soft tissues. Catheter coiled in the pelvis likely represents a peritoneal dialysis catheter. IMPRESSION: Acute transverse fracture of the left femoral neck with varus angulation of the fracture fragments. Electronically Signed   By: Lucienne Capers M.D.   On: 08/25/2021 23:02     Medications:    sodium chloride 1 mL/hr at 08/26/21 1720    ceFAZolin (ANCEF) IV 1 g (08/27/21 0718)   clindamycin (CLEOCIN) IV 600 mg (08/27/21 1660)   methocarbamol (ROBAXIN) IV     tranexamic acid      amLODipine  5 mg Oral Daily   aspirin EC  325 mg Oral Q breakfast   Chlorhexidine Gluconate Cloth  6 each Topical Daily   docusate sodium  100 mg Oral BID   ferrous sulfate  325 mg Oral Q breakfast   furosemide  120 mg Oral Daily   hydrALAZINE  25 mg Oral BID   insulin aspart  0-5 Units Subcutaneous QHS   insulin aspart  0-9 Units Subcutaneous TID WC   levothyroxine  100 mcg Oral Q0600   losartan  100 mg Oral Daily   magnesium oxide  400 mg Oral Daily   metoprolol tartrate  25 mg Oral BID   multivitamin  1 tablet Oral Daily   mupirocin ointment  1 application Nasal BID   pantoprazole  40 mg Oral BID   rosuvastatin  10 mg Oral Daily   sevelamer carbonate  800 mg Oral BID WC   sodium chloride flush  3 mL Intravenous Q12H   tamsulosin  0.4 mg Oral Daily   vitamin B-12  5,000 mcg Oral Daily   sodium chloride, acetaminophen **OR** acetaminophen, albuterol, alum & mag hydroxide-simeth, bisacodyl, HYDROcodone-acetaminophen, magnesium hydroxide, menthol-cetylpyridinium **OR** phenol, methocarbamol **OR** methocarbamol (ROBAXIN) IV, metoCLOPramide **OR** metoCLOPramide (REGLAN) injection, morphine injection, morphine injection, ondansetron **OR** ondansetron (ZOFRAN) IV, sodium chloride flush, sodium phosphate, traZODone, zolpidem  Assessment/ Plan:  [name] is a 80 y.o. white male with end stage renal  disease on peritoneal dialysis, hypertension, diabetes mellitus type II, hypothyroidism, GERD, hyperlipidemia, BPH, congestive heart failure, asthma who is admitted to [facility] on 08/25/2021 for Closed left hip fracture ([place]) [S72.002A] Closed fracture of left hip, initial encounter ([place]) [S72.002A] Fall, initial encounter [W19.XXXA]  [facility] CCPD 8 hours 4 exchanges 2065mL fills   End Stage Renal Disease: on peritoneal dialysis. Last treatment was Friday night. Wife and patient report no issues on peritoneal dialysis. Tolerating treatments well at home.  - Plan on transitioning patient to intermittent in center hemodialysis while patient is in rehab.  -Vascular to place permcath today. Plan to dialyze tomorrow - Will d/c IVF due to shortness of breath. Patient reports making a small amount of urine. Continue Furosemide 120mg  tab daily  Hypertension: elevated at 149/67. Home regimen of furosemide, losartan, metoprolol, amlodipine and tamsulosin. Currently receiving during admission.  Anemia of chronic kidney disease: hemoglobin 9.6. normocytic. ESA as outpatient.   Secondary Hyperparathyroidism: with hyperphosphatemia on 9/9,  phos of 6.3.  - sevelamer with meals.   Disposition: transition to in center hemodialysis. Will need three treatments. Dialysis coordinator made aware of outpatient needs.   LOS: 2 Laramie Gelles 10/10/202212:26 PM

## 2021-08-27 NOTE — TOC Progression Note (Signed)
Transition of Care [facility]) - Progression Note    Patient Details  Name: Manuel Klein MRN: [id number] Date of Birth: [birth year]  Transition of Care [facility]) CM/SW [place], RN Phone Number: 08/27/2021, 10:30 AM  Clinical Narrative:   Met with the patient and his wife in the room at the bedside.  The patient will have a Cath placed ay 415 today for dialysis, They need a chair time for Hassan Buckler with [facility] is aware and is working to get a chair time at [facility] [employer] [facility], [facility] is preferred, PT to complete an evaluation and put in recommendation, Notified Tina at [facility] that the patient would like a bed at [facility]    Expected Discharge Plan: [place] Barriers to Discharge: Continued Medical Work up  Expected Discharge Plan and Services Expected Discharge Plan: [place] Choice: Resumption of Svcs/PTA Provider Living arrangements for the past 2 months: [place]: [place] Date [place]: 08/26/21   Representative spoke with at [place]: [place] (SDOH) Interventions    Readmission Risk Interventions Readmission Risk Prevention Plan 08/26/2021  Transportation Screening Complete  PCP or Specialist Appt within 3-5 Days Complete  HRI or Home Care Consult Complete  Social Work Consult for [place] Planning/Counseling Complete  Palliative Care Screening Not Applicable  Medication Review [employer] photographer) Complete  Some recent data might be hidden

## 2021-08-28 LAB — BASIC METABOLIC PANEL
Anion gap: 13 (ref 5–15)
BUN: 82 mg/dL — ABNORMAL HIGH (ref 8–23)
CO2: 21 mmol/L — ABNORMAL LOW (ref 22–32)
Calcium: 7.8 mg/dL — ABNORMAL LOW (ref 8.9–10.3)
Chloride: 102 mmol/L (ref 98–111)
Creatinine, Ser: 9.44 mg/dL — ABNORMAL HIGH (ref 0.61–1.24)
GFR, Estimated: 5 mL/min — ABNORMAL LOW (ref >60)
Glucose, Bld: 84 mg/dL (ref 70–99)
Potassium: 3.6 mmol/L (ref 3.5–5.1)
Sodium: 136 mmol/L (ref 135–145)

## 2021-08-28 LAB — CBC
HCT: 25.5 % — ABNORMAL LOW (ref 39.0–52.0)
Hemoglobin: 8.5 g/dL — ABNORMAL LOW (ref 13.0–17.0)
MCH: 30.7 pg (ref 26.0–34.0)
MCHC: 33.3 g/dL (ref 30.0–36.0)
MCV: 92.1 fL (ref 80.0–100.0)
Platelets: 207 10*3/uL (ref 150–400)
RBC: 2.77 MIL/uL — ABNORMAL LOW (ref 4.22–5.81)
RDW: 16.1 % — ABNORMAL HIGH (ref 11.5–15.5)
WBC: 13.8 10*3/uL — ABNORMAL HIGH (ref 4.0–10.5)
nRBC: 0 % (ref 0.0–0.2)

## 2021-08-28 LAB — TYPE AND SCREEN
ABO/RH(D): A POS
Antibody Screen: NEGATIVE
Unit division: 0
Unit division: 0

## 2021-08-28 LAB — GLUCOSE, CAPILLARY
Glucose-Capillary: 81 mg/dL (ref 70–99)
Glucose-Capillary: 82 mg/dL (ref 70–99)
Glucose-Capillary: 111 mg/dL — ABNORMAL HIGH (ref 70–99)
Glucose-Capillary: 202 mg/dL — ABNORMAL HIGH (ref 70–99)

## 2021-08-28 LAB — BPAM RBC
Blood Product Expiration Date: 202211042359
Blood Product Expiration Date: 202211052359
Unit Type and Rh: 6200
Unit Type and Rh: 6200

## 2021-08-28 LAB — PREPARE RBC (CROSSMATCH)

## 2021-08-28 LAB — RESP PANEL BY RT-PCR (FLU A&B, COVID) ARPGX2
Influenza A by PCR: NEGATIVE
Influenza B by PCR: NEGATIVE
SARS Coronavirus 2 by RT PCR: NEGATIVE

## 2021-08-28 LAB — SURGICAL PATHOLOGY

## 2021-08-28 LAB — HEPATITIS B SURFACE ANTIBODY, QUANTITATIVE: Hep B S AB Quant (Post): <3.1 m[IU]/mL — ABNORMAL LOW (ref >9.9)

## 2021-08-28 MED ORDER — HEPARIN SODIUM (PORCINE) 1000 UNIT/ML IJ SOLN
INTRAMUSCULAR | Status: AC
  Filled 2021-08-28: qty 1

## 2021-08-28 MED ORDER — FUROSEMIDE 40 MG PO TABS
120.0000 mg | ORAL_TABLET | ORAL | Status: DC
  Administered 2021-08-30: 120 mg via ORAL
  Filled 2021-08-28: qty 3

## 2021-08-28 MED ORDER — ONDANSETRON HCL 4 MG/2ML IJ SOLN
INTRAMUSCULAR | Status: AC
  Filled 2021-08-28: qty 2

## 2021-08-28 NOTE — Progress Notes (Signed)
Pt states surgical pain in his left hip is making him nauseated. Zofran administered.

## 2021-08-28 NOTE — Plan of Care (Signed)
No acute events this shift. Problem: Skin Integrity: Goal: Risk for impaired skin integrity will decrease Outcome: Progressing   Problem: Tissue Perfusion: Goal: Adequacy of tissue perfusion will improve Outcome: Progressing   Problem: Education: Goal: Knowledge of General Education information will improve Description: Including pain rating scale, medication(s)/side effects and non-pharmacologic comfort measures Outcome: Progressing   Problem: Health Behavior/Discharge Planning: Goal: Ability to manage health-related needs will improve Outcome: Progressing   Problem: Clinical Measurements: Goal: Ability to maintain clinical measurements within normal limits will improve Outcome: Progressing Goal: Will remain free from infection Outcome: Progressing Goal: Diagnostic test results will improve Outcome: Progressing Goal: Respiratory complications will improve Outcome: Progressing Goal: Cardiovascular complication will be avoided Outcome: Progressing   Problem: Activity: Goal: Risk for activity intolerance will decrease Outcome: Progressing   Problem: Nutrition: Goal: Adequate nutrition will be maintained Outcome: Progressing   Problem: Coping: Goal: Level of anxiety will decrease Outcome: Progressing   Problem: Pain Managment: Goal: General experience of comfort will improve Outcome: Progressing   Problem: Safety: Goal: Ability to remain free from injury will improve Outcome: Progressing   Problem: Skin Integrity: Goal: Risk for impaired skin integrity will decrease Outcome: Progressing

## 2021-08-28 NOTE — Progress Notes (Signed)
[facility] NAME: Manuel Klein    MR#:  [id number]  DATE OF BIRTH:  [birth year]  SUBJECTIVE:  patient came in after mechanical fall at home he slipped in his bathroom. Has a fracture. Difficulty urinating today. History of BPH.    POD #2 working with PT. S/p Perm cath and just back from HD REVIEW OF SYSTEMS:   Review of Systems  Constitutional:  Negative for chills, fever and weight loss.  HENT:  Negative for ear discharge, ear pain and nosebleeds.   Eyes:  Negative for blurred vision, pain and discharge.  Respiratory:  Negative for sputum production, shortness of breath, wheezing and stridor.   Cardiovascular:  Negative for chest pain, palpitations, orthopnea and PND.  Gastrointestinal:  Negative for abdominal pain, diarrhea, nausea and vomiting.  Genitourinary:  Negative for frequency and urgency.  Musculoskeletal:  Positive for joint pain. Negative for back pain.  Neurological:  Positive for weakness. Negative for sensory change, speech change and focal weakness.  Psychiatric/Behavioral:  Negative for depression and hallucinations. The patient is not nervous/anxious.   Tolerating Diet:yes Tolerating PT: Rehab  DRUG ALLERGIES:   Allergies  Allergen Reactions   Hydralazine     Other reaction(s): Dizziness, Muscle Pain Chest pain   Atorvastatin     Other reaction(s): Muscle Pain   Carvedilol Other (See Comments)    Dizziness, drunk feeling   Ciprofloxacin Rash   Penicillins Rash and Other (See Comments)    Has patient had a PCN reaction causing immediate rash, facial/tongue/throat swelling, SOB or lightheadedness with hypotension: No Has patient had a PCN reaction causing severe rash involving mucus membranes or skin necrosis: No Has patient had a PCN reaction that required hospitalization: No Has patient had a PCN reaction occurring within the last 10 years: No If all of the above answers are "NO", then may proceed with  Cephalosporin use.    Sulfa Antibiotics Rash   Sulfasalazine Rash    VITALS:  Blood pressure (!) 157/53, pulse 71, temperature 98.3 F (36.8 C), temperature source Oral, resp. rate 15, height 5\' 9"  (1.753 m), weight 80 kg, SpO2 97 %.  PHYSICAL EXAMINATION:   Physical Exam  GENERAL:  80 y.o.-year-old patient lying in the bed with no acute distress.  LUNGS: Normal breath sounds bilaterally, no wheezing, rales, rhonchi. No use of accessory muscles of respiration.  CARDIOVASCULAR: S1, S2 normal. No murmurs, rubs, or gallops.  ABDOMEN: Soft, nontender, nondistended. Bowel sounds present. No organomegaly or mass. Foley+, PD cath+ EXTREMITIES: No cyanosis, clubbing or edema b/l.   Left lower Extremity decreased ROM NEUROLOGIC: Cranial nerves II through XII are intact. No focal Motor or sensory deficits b/l.   PSYCHIATRIC:  patient is alert and oriented x 3.  SKIN: No obvious rash, lesion, or ulcer.   LABORATORY PANEL:  CBC Recent Labs  Lab 08/28/21 0553  WBC 13.8*  HGB 8.5*  HCT 25.5*  PLT 207     Chemistries  Recent Labs  Lab 08/28/21 0553  NA 136  K 3.6  CL 102  CO2 21*  GLUCOSE 84  BUN 82*  CREATININE 9.44*  CALCIUM 7.8*    Cardiac Enzymes No results for input(s): TROPONINI in the last 168 hours. RADIOLOGY:  PERIPHERAL VASCULAR CATHETERIZATION  Result Date: 08/27/2021 See surgical note for result.  DG Hip Port Unilat With Pelvis 1V Left  Result Date: 08/26/2021 CLINICAL DATA:  Postop left hemiarthroplasty EXAM: DG HIP (WITH OR WITHOUT PELVIS) 1V  PORT LEFT COMPARISON:  08/25/2021 FINDINGS: Left hip replacement. Normal alignment. No hardware complicating feature. IMPRESSION: Left hip replacement.  No visible complicating feature. Electronically Signed   By: Rolm Baptise M.D.   On: 08/26/2021 19:14   ASSESSMENT AND PLAN:  Manuel Klein is a 80 y.o. Caucasian male with medical history significant for end-stage renal disease on peritoneal dialysis, CHF, coronary  artery disease, type diabetes mellitus, hypertension, dyslipidemia, GERD and asthma, who presented to the ER with acute onset of accidental mechanical fall on his left side, slipping in the shower with subsequent left hip pain and inability to get up.     Closed left hip/femoral neck fracture secondary to mechanical fall. - prn Pain meds - Aspirin on hold - Orthopedic consultation with  Dr. Sabra Heck  -10/10--POD #1 hip surgery. PT initiated --10/11--POD #2 cont PT   End-stage renal disease on peritoneal dialysis. - Nephrology consultation with Dr Raliegh Ip - patient is on peritoneal dialysis. Will switch him to hemodialysis perioperative and for rehab purposes. Vascular consultation has been placed --10/10--pt to get perm cath  --10/11-- await HD chair time  Essential hypertension. -  continue  antihypertensives  Type 2 diabetes mellitus with HL - SSI  BPH acute urinary retention - continue Flomax. -- Urology consultation with Dr. Bernardo Heater for acute urinary retention and inability place catheter. Dr. Bernardo Heater recommends Foley catheter placement under anesthesia cystoscopy and possible urethral dilatation --Per Dr Karren Burly foley in for 5-7 days. Pt had Bulbar urethral narrowing requiring to place Foley under anesthesia. No dilation needed. Pt can f/u Urology for out pt foley removal   Hypothyroidism. - continue Synthroid.  Dyslipidemia. - continue statin therapy.  GERD. -  continue PPI therapy.  Asthma without exacerbation. --as needed oxygen  Procedures: Left   hip hemiarthroplasty --10/9 Family communication :wife at bedside Consults :ortho,urology CODE STATUS: DNR DVT Prophylaxis :SCD Level of care: Med-Surg Status is: Inpatient  Remains inpatient appropriate because:IV treatments appropriate due to intensity of illness or inability to take PO  Dispo: The patient is from: Home              Anticipated d/c is to: SNF              Patient currently is improving.    Difficult to place patient No  TOC for discharge planning. Patient will discharge to rehab likely 10/12 once HD chair time is available      TOTAL TIME TAKING CARE OF THIS PATIENT: 25 minutes.  >50% time spent on counselling and coordination of care  Note: This dictation was prepared with Dragon dictation along with smaller phrase technology. Any transcriptional errors that result from this process are unintentional.  Fritzi Mandes M.D    [facility]   CC: Primary care physician; Lynnell Jude, MD Patient ID: Manuel Klein, male   DOB: 1941-07-09, 80 y.o.   MRN: [id number]

## 2021-08-28 NOTE — Progress Notes (Signed)
Subjective: 1 Day Post-Op Procedure(s) (LRB): DIALYSIS/PERMA CATHETER INSERTION (Right) The patient is 2 days post right left hip hemiarthroplasty.  He is down in hemodialysis today.  His hemoglobin is stable at 8.6.  Patient reports pain as    .  Objective:   VITALS:   Vitals:   08/28/21 1130 08/28/21 1145  BP: (!) 156/65 (!) 152/60  Pulse: 68 70  Resp: 12 13  Temp:    SpO2:        LABS Recent Labs    08/25/21 2228 08/26/21 0453 08/28/21 0553  HGB 11.0* 9.6* 8.5*  HCT 32.9* 28.9* 25.5*  WBC 16.3* 14.7* 13.8*  PLT 270 221 207    Recent Labs    08/25/21 2228 08/26/21 0453 08/28/21 0553  NA 138 139 136  K 3.5 3.4* 3.6  BUN 67* 69* 82*  CREATININE 7.67* 7.53* 9.44*  GLUCOSE 199* 192* 84    Recent Labs    08/25/21 2228  INR 1.1     Assessment/Plan: 1 Day Post-Op Procedure(s) (LRB): DIALYSIS/PERMA CATHETER INSERTION (Right)   Up with therapy Discharge to SNF

## 2021-08-28 NOTE — Progress Notes (Signed)
PT Cancellation Note  Patient Details Name: Manuel Klein MRN: [id number] DOB: 1941/03/15   Cancelled Treatment:    Reason Eval/Treat Not Completed: Fatigue/lethargy limiting ability to participate.  Pt sleeping in bed upon PT arrival; pt's wife present.  Pt reporting being exhausted after dialysis today and declined PT session today.  Educated pt and pt's wife on need to sit up in chair for 4 hours for dialysis (pt too tired to attempt anymore today).  Per discussion with MD Sabra Heck (d/t posterior hip precautions), ok to use hoyer lift to get pt to chair (if needed for dialysis).  Will re-attempt PT session tomorrow.  Leitha Bleak, PT 08/28/21, 4:16 PM

## 2021-08-28 NOTE — Progress Notes (Signed)
Not able to get a pre and post dialysis weight due to bed scale not working and pt is not able to stand at this time due to recent hip surgery and fracture.

## 2021-08-28 NOTE — Evaluation (Signed)
Occupational Therapy Evaluation Patient Details Name: Manuel Klein MRN: [id number] DOB: 10-24-1941 Today's Date: 08/28/2021   History of Present Illness Pt is a 80 y.o. male presenting to hospital 10/8 with L leg pain s/p fall in shower.  Pt admitted with closed L hip/femoral neck fx secondary to mechanical fall.  S/p L hip hemiarthroplasty secondary to displaced subcapital fx of L hip 08/26/2021.  PMH includes asthma, CHF, CKD stage IV, CAD, DM, htn, MI, sz's, ESRD on PD, symptomatic anemia.   Clinical Impression   Pt seen for OT evaluation this date, POD#2 from above surgery. Pt was independent in all ADLs prior to surgery, ambulating with a QC, wife assists with IADL. Pt does not drive. He reports 4 falls in previous 12 months; receives HHPT 1x/wk. During today's evaluation, pt denies pain; however, displays extreme fatigue and lethargy, perhaps 2/2 pain medications + initial HD session (Pt has previously received PD). Pt requires Max A + 2 assist with bed mobility, as he does not have the energy to participate more than minimally in any physical activity at present. Provided education re: posterior hip precautions, ECS, falls prevention, with spouse verbalizing understanding. Pt will need additional instruction on these topics, given that fatigue limited his ability to engage and concentrate. Recommend ongoing OT while pt is hospitalized, with DC to SNF for STR prior to return home.       Recommendations for follow up therapy are one component of a multi-disciplinary discharge planning process, led by the attending physician.  Recommendations may be updated based on patient status, additional functional criteria and insurance authorization.   Follow Up Recommendations  SNF    Equipment Recommendations  Other (comment) (RW)    Recommendations for Other Services       Precautions / Restrictions Precautions Precautions: Fall;Posterior Hip Precaution Comments: Foley catheter; peritoneal  dialysis site; R internal jugular vein tunnelled HD cath Restrictions Weight Bearing Restrictions: Yes LLE Weight Bearing: Partial weight bearing Other Position/Activity Restrictions: Bucks Traction      Mobility Bed Mobility Overal bed mobility: Needs Assistance Bed Mobility: Supine to Sit;Sit to Supine     Supine to sit: Max assist;+2 for physical assistance Sit to supine: +2 for physical assistance;HOB elevated;Max assist   General bed mobility comments: VCs for technique    Transfers                 General transfer comment: Pt unable    Balance Overall balance assessment: Needs assistance Sitting-balance support: No upper extremity supported;Feet supported Sitting balance-Leahy Scale: Fair       Standing balance-Leahy Scale: Zero Standing balance comment: Pt unable to come into standing, 2/2 lethargy, weakness                           ADL either performed or assessed with clinical judgement   ADL                                               Vision         Perception     Praxis      Pertinent Vitals/Pain Pain Assessment: No/denies pain     Hand Dominance     Extremity/Trunk Assessment Upper Extremity Assessment Upper Extremity Assessment: Generalized weakness   Lower Extremity Assessment Lower Extremity Assessment: LLE deficits/detail LLE Deficits /  Details: s/p Left THA LLE: Unable to fully assess due to pain LLE Sensation: WNL       Communication     Cognition Arousal/Alertness: Lethargic Behavior During Therapy: WFL for tasks assessed/performed Overall Cognitive Status: Within Functional Limits for tasks assessed                                 General Comments: Pt requiring increased time to respond at times   General Comments       Exercises Other Exercises Other Exercises: Educ re: hip precautions, importance of mobility, OOB activity, DC recs, falls prevention   Shoulder  Instructions      Home Living Family/patient expects to be discharged to:: Private residence Living Arrangements: Spouse/significant other Available Help at Discharge: Family;Available 24 hours/day Type of Home: House Home Access: Stairs to enter [employer] of Steps: 4 steps with R railing plus 1 step no railing (through doorway)   Home Layout: One level         Bathroom Toilet: Handicapped height     Home Equipment: [place] - quad;Shower seat          Prior Functioning/Environment Level of Independence: Needs assistance  Gait / Transfers Assistance Needed: ambulator with quad cane. HHPT 1x/wk. 4 falls in previous 12 months. ADL's / Homemaking Assistance Needed: Ind with ADL. Wife assists with all IADL.            OT Problem List: Decreased strength;Impaired balance (sitting and/or standing);Decreased knowledge of precautions;Pain;Decreased range of motion;Decreased activity tolerance;Decreased knowledge of use of DME or AE      OT Treatment/Interventions: Self-care/ADL training;DME and/or AE instruction;Therapeutic activities;Balance training;Therapeutic exercise;Energy conservation;Patient/family education    OT Goals(Current goals can be found in the care plan section) Acute Rehab OT Goals Patient Stated Goal: to improve pain and mobility OT Goal Formulation: With patient/family Time For Goal Achievement: 09/11/21 Potential to Achieve Goals: Good ADL Goals Pt Will Perform Lower Body Dressing: with min guard assist;sit to/from stand (using AE as needed) Pt Will Transfer to Toilet: with min assist;stand pivot transfer (using LRAD) Pt/caregiver will Perform Home Exercise Program: Increased ROM;Increased strength;With Supervision;With written HEP provided  OT Frequency: Min 1X/week   Barriers to D/C:            Co-evaluation              AM-PAC OT "6 Clicks" Daily Activity     Outcome Measure Help from another person eating meals?: None Help  from another person taking care of personal grooming?: A Little Help from another person toileting, which includes using toliet, bedpan, or urinal?: A Lot Help from another person bathing (including washing, rinsing, drying)?: A Lot Help from another person to put on and taking off regular upper body clothing?: A Little Help from another person to put on and taking off regular lower body clothing?: A Lot 6 Click Score: 16   End of Session    Activity Tolerance: Patient limited by lethargy Patient left: in bed;with family/visitor present;with bed alarm set  OT Visit Diagnosis: Unsteadiness on feet (R26.81);Muscle weakness (generalized) (M62.81);Other abnormalities of gait and mobility (R26.89);Repeated falls (R29.6);Pain Pain - Right/Left: Left Pain - part of body: Hip                Time: 0130-0150 OT Time Calculation (min): 20 min Charges:  OT Evaluation $OT Eval Moderate Complexity: 1 Mod OT Treatments $Self Care/Home Management :  8-22 mins Josiah Lobo, PhD, MS, OTR/L 08/28/21, 2:24 PM

## 2021-08-28 NOTE — Progress Notes (Addendum)
[facility]  ROUNDING NOTE   Subjective:   [name] was admitted to [facility] on 08/25/2021 for Closed left hip fracture [facility]) [S72.002A] Closed fracture of left hip, initial encounter ([place]) [S72.002A] Fall, initial encounter [W19.XXXA]  Patient seen and evaluated during dialysis   HEMODIALYSIS FLOWSHEET:  Blood Flow Rate (mL/min): 200 mL/min Arterial Pressure (mmHg): -60 mmHg Venous Pressure (mmHg): 90 mmHg Transmembrane Pressure (mmHg): 20 mmHg Ultrafiltration Rate (mL/min): 0 mL/min Dialysate Flow Rate (mL/min): 300 ml/min Conductivity: Machine : 13.6 Conductivity: Machine : 13.6 Dialysis Fluid Bolus: Normal Saline Bolus Amount (mL): 250 mL  Complaints of left hip pain  No other complaints at this time  Objective:  Vital signs in last 24 hours:  Temp:  [97.4 F (36.3 C)-98.3 F (36.8 C)] 98.3 F (36.8 C) (10/11 1302) Pulse Rate:  [64-77] 71 (10/11 1302) Resp:  [9-71] 15 (10/11 1302) BP: (118-157)/(52-85) 157/53 (10/11 1302) SpO2:  [90 %-97 %] 97 % (10/11 1302)  Weight change:  Filed Weights   08/25/21 2214  Weight: 80 kg    Intake/Output: I/O last 3 completed shifts: In: 1318.4 [P.O.:765; I.V.:453.4; IV Piggyback:100] Out: 350 [Urine:350]   Intake/Output this shift:  Total I/O In: -  Out: 1000 [Other:1000]  Physical Exam: General: NAD, laying in bed  Head: Normocephalic, atraumatic. Moist oral mucosal membranes  Eyes: Anicteric  Lungs:  Crackles bilaterally, [place] 2L  Heart: Regular rate and rhythm  Abdomen:  Soft, nontender  Extremities:  no peripheral edema  Neurologic: Nonfocal, moving all four extremities  Skin: No lesions  Access: PD catheter, Rt IJ Permcath placed on 96/29/52    Basic Metabolic Panel: Recent Labs  Lab 08/25/21 2228 08/26/21 0453 08/28/21 0553  NA 138 139 136  K 3.5 3.4* 3.6  CL 100 102 102  CO2 25 24 21*  GLUCOSE 199* 192* 84  BUN 67* 69* 82*  CREATININE 7.67* 7.53* 9.44*  CALCIUM 7.9* 7.7* 7.8*      Liver Function Tests: No results for input(s): AST, ALT, ALKPHOS, BILITOT, PROT, ALBUMIN in the last 168 hours. No results for input(s): LIPASE, AMYLASE in the last 168 hours. No results for input(s): AMMONIA in the last 168 hours.  CBC: Recent Labs  Lab 08/25/21 2228 08/26/21 0453 08/28/21 0553  WBC 16.3* 14.7* 13.8*  NEUTROABS 10.1*  --   --   HGB 11.0* 9.6* 8.5*  HCT 32.9* 28.9* 25.5*  MCV 92.7 93.5 92.1  PLT 270 221 207     Cardiac Enzymes: No results for input(s): CKTOTAL, CKMB, CKMBINDEX, TROPONINI in the last 168 hours.  BNP: Invalid input(s): POCBNP  CBG: Recent Labs  Lab 08/27/21 1642 08/27/21 1734 08/27/21 2147 08/28/21 0729 08/28/21 1303  GLUCAP 111* 106* 173* 81 82     Microbiology: Results for orders placed or performed during the hospital encounter of 08/25/21  Resp Panel by RT-PCR (Flu A&B, Covid) Nasopharyngeal Swab     Status: None   Collection Time: 08/25/21 11:10 PM   Specimen: Nasopharyngeal Swab; Nasopharyngeal(NP) swabs in vial transport medium  Result Value Ref Range Status   SARS Coronavirus 2 by RT PCR NEGATIVE NEGATIVE Final    Comment: (NOTE) SARS-CoV-2 target nucleic acids are NOT DETECTED.  The SARS-CoV-2 RNA is generally detectable in upper respiratory specimens during the acute phase of infection. The lowest concentration of SARS-CoV-2 viral copies this assay can detect is 138 copies/mL. A negative result does not preclude SARS-Cov-2 infection and should not be used as the sole basis for  treatment or other patient management decisions. A negative result may occur with  improper specimen collection/handling, submission of specimen other than nasopharyngeal swab, presence of viral mutation(s) within the areas targeted by this assay, and inadequate number of viral copies(<138 copies/mL). A negative result must be combined with clinical observations, patient history, and epidemiological information. The expected result is  Negative.  Fact Sheet for Patients:  EntrepreneurPulse.com.au  Fact Sheet for Healthcare Providers:  IncredibleEmployment.be  This test is no t yet approved or cleared by the [place] FDA and  has been authorized for detection and/or diagnosis of SARS-CoV-2 by FDA under an Emergency Use Authorization (EUA). This EUA will remain  in effect (meaning this test can be used) for the duration of the COVID-19 declaration under Section 564(b)(1) of the Act, 21 U.S.C.section 360bbb-3(b)(1), unless the authorization is terminated  or revoked sooner.       Influenza A by PCR NEGATIVE NEGATIVE Final   Influenza B by PCR NEGATIVE NEGATIVE Final    Comment: (NOTE) The Xpert Xpress SARS-CoV-2/FLU/RSV plus assay is intended as an aid in the diagnosis of influenza from Nasopharyngeal swab specimens and should not be used as a sole basis for treatment. Nasal washings and aspirates are unacceptable for Xpert Xpress SARS-CoV-2/FLU/RSV testing.  Fact Sheet for Patients: EntrepreneurPulse.com.au  Fact Sheet for Healthcare Providers: IncredibleEmployment.be  This test is not yet approved or cleared by the [place] FDA and has been authorized for detection and/or diagnosis of SARS-CoV-2 by FDA under an Emergency Use Authorization (EUA). This EUA will remain in effect (meaning this test can be used) for the duration of the COVID-19 declaration under Section 564(b)(1) of the Act, 21 U.S.C. section 360bbb-3(b)(1), unless the authorization is terminated or revoked.  Performed at [facility], [address] [place], [place] [address]   Surgical PCR screen     Status: None   Collection Time: 08/26/21  4:27 AM   Specimen: Nasal Mucosa; Nasal Swab  Result Value Ref Range Status   MRSA, PCR NEGATIVE NEGATIVE Final   Staphylococcus aureus NEGATIVE NEGATIVE Final    Comment: (NOTE) The Xpert SA Assay (FDA  approved for NASAL specimens in patients 96 years of age and older), is one component of a comprehensive surveillance program. It is not intended to diagnose infection nor to guide or monitor treatment. Performed at [facility], [address] [place], [place] [address]     Coagulation Studies: Recent Labs    08/25/21 04-Feb-2227  LABPROT 13.7  INR 1.1     Urinalysis: No results for input(s): COLORURINE, LABSPEC, PHURINE, GLUCOSEU, HGBUR, BILIRUBINUR, KETONESUR, PROTEINUR, UROBILINOGEN, NITRITE, LEUKOCYTESUR in the last 72 hours.  Invalid input(s): APPERANCEUR    Imaging: PERIPHERAL VASCULAR CATHETERIZATION  Result Date: 08/27/2021 See surgical note for result.  DG Hip Port Unilat With Pelvis 1V Left  Result Date: 08/26/2021 CLINICAL DATA:  Postop left hemiarthroplasty EXAM: DG HIP (WITH OR WITHOUT PELVIS) 1V PORT LEFT COMPARISON:  08/25/2021 FINDINGS: Left hip replacement. Normal alignment. No hardware complicating feature. IMPRESSION: Left hip replacement.  No visible complicating feature. Electronically Signed   By: Rolm Baptise M.D.   On: 08/26/2021 19:14     Medications:    sodium chloride 1 mL/hr at 08/26/21 1720   methocarbamol (ROBAXIN) IV     tranexamic acid      amLODipine  5 mg Oral Daily   aspirin EC  325 mg Oral Q breakfast   Chlorhexidine Gluconate Cloth  6 each Topical Daily  docusate sodium  100 mg Oral BID   ferrous sulfate  325 mg Oral Q breakfast   furosemide  120 mg Oral Daily   heparin sodium (porcine)       hydrALAZINE  25 mg Oral BID   insulin aspart  0-5 Units Subcutaneous QHS   insulin aspart  0-9 Units Subcutaneous TID WC   levothyroxine  100 mcg Oral Q0600   losartan  100 mg Oral Daily   magnesium oxide  400 mg Oral Daily   metoprolol tartrate  25 mg Oral BID   multivitamin  1 tablet Oral Daily   mupirocin ointment  1 application Nasal BID   ondansetron       pantoprazole  40 mg Oral BID   rosuvastatin  10 mg Oral Daily    sevelamer carbonate  800 mg Oral BID WC   sodium chloride flush  3 mL Intravenous Q12H   tamsulosin  0.4 mg Oral Daily   vitamin B-12  5,000 mcg Oral Daily   sodium chloride, acetaminophen **OR** acetaminophen, albuterol, alum & mag hydroxide-simeth, bisacodyl, HYDROcodone-acetaminophen, HYDROmorphone (DILAUDID) injection, magnesium hydroxide, menthol-cetylpyridinium **OR** phenol, methocarbamol **OR** methocarbamol (ROBAXIN) IV, metoCLOPramide **OR** metoCLOPramide (REGLAN) injection, morphine injection, morphine injection, ondansetron **OR** ondansetron (ZOFRAN) IV, ondansetron (ZOFRAN) IV, sodium chloride flush, sodium phosphate, traZODone, zolpidem  Assessment/ Plan:  [name] is a 80 y.o. white male with end stage renal disease on peritoneal dialysis, hypertension, diabetes mellitus type II, hypothyroidism, GERD, hyperlipidemia, BPH, congestive heart failure, asthma who is admitted to [facility] on 08/25/2021 for Closed left hip fracture ([place]) [S72.002A] Closed fracture of left hip, initial encounter ([place]) [S72.002A] Fall, initial encounter [W19.XXXA]  [facility] CCPD 8 hours 4 exchanges 2015mL fills   End Stage Renal Disease: on peritoneal dialysis. Last treatment was Friday night. Wife and patient report no issues on peritoneal dialysis. Tolerating treatments well at home.  - Plan on transitioning patient to intermittent in center hemodialysis while patient is in rehab.  -Rt IJ Permcath placed by Vascular on 08/27/21. - Receiving dialysis today, tolerating well. Plan to dialyze tomorrow.   - Will transition Furosemide 120mg  tab daily to non-dialysis days - Will need to tolerate dialysis in chair   Hypertension: elevated at 157/53. Home regimen of furosemide, losartan, metoprolol, amlodipine and tamsulosin. Currently receiving during admission.  Anemia of chronic kidney disease: hemoglobin 8.5. normocytic. ESA as outpatient.   Secondary  Hyperparathyroidism: with hyperphosphatemia on 9/9, phos of 6.3.  - Continue sevelamer with meals.   Disposition: transition to in center hemodialysis. Dialysis coordinator made aware of outpatient needs.   LOS: 3 Reesa Gotschall 10/11/20221:30 PM

## 2021-08-28 NOTE — Progress Notes (Signed)
OT Cancellation Note  Patient Details Name: Manuel Klein MRN: [id number] DOB: June 22, 1941   Cancelled Treatment:    Reason Eval/Treat Not Completed: Patient at procedure or test/ unavailable. Order received, chart reviewed. Pt is receiving HD this AM. Will attempt OT evaluation at a later time/date, as pt is available and willing.  Josiah Lobo, PhD, MS, OTR/L 08/28/21, 10:14 AM

## 2021-08-28 NOTE — Progress Notes (Signed)
PT Cancellation Note  Patient Details Name: Manuel Klein MRN: [id number] DOB: 1941/08/23   Cancelled Treatment:    Reason Eval/Treat Not Completed: Other (comment).  Pt currently off unit at dialysis.  Will re-attempt PT treatment session at a later date/time.  Leitha Bleak, PT 08/28/21, 10:31 AM

## 2021-08-29 DIAGNOSIS — R338 Other retention of urine: Secondary | ICD-10-CM

## 2021-08-29 DIAGNOSIS — E1122 Type 2 diabetes mellitus with diabetic chronic kidney disease: Secondary | ICD-10-CM

## 2021-08-29 DIAGNOSIS — D631 Anemia in chronic kidney disease: Secondary | ICD-10-CM

## 2021-08-29 DIAGNOSIS — N400 Enlarged prostate without lower urinary tract symptoms: Secondary | ICD-10-CM

## 2021-08-29 LAB — CBC
HCT: 26.3 % — ABNORMAL LOW (ref 39.0–52.0)
Hemoglobin: 8.8 g/dL — ABNORMAL LOW (ref 13.0–17.0)
MCH: 31.1 pg (ref 26.0–34.0)
MCHC: 33.5 g/dL (ref 30.0–36.0)
MCV: 92.9 fL (ref 80.0–100.0)
Platelets: 225 10*3/uL (ref 150–400)
RBC: 2.83 MIL/uL — ABNORMAL LOW (ref 4.22–5.81)
RDW: 16.1 % — ABNORMAL HIGH (ref 11.5–15.5)
WBC: 14.1 10*3/uL — ABNORMAL HIGH (ref 4.0–10.5)
nRBC: 0 % (ref 0.0–0.2)

## 2021-08-29 LAB — HEPATITIS B CORE ANTIBODY, TOTAL: Hep B Core Total Ab: NONREACTIVE

## 2021-08-29 LAB — HEPATITIS C ANTIBODY: HCV Ab: <0.1 s/co ratio — AB (ref 0.0–0.9)

## 2021-08-29 LAB — HEPATITIS B SURFACE ANTIBODY,QUALITATIVE: Hep B S Ab: NONREACTIVE

## 2021-08-29 LAB — GLUCOSE, CAPILLARY
Glucose-Capillary: 120 mg/dL — ABNORMAL HIGH (ref 70–99)
Glucose-Capillary: 96 mg/dL (ref 70–99)
Glucose-Capillary: 112 mg/dL — ABNORMAL HIGH (ref 70–99)
Glucose-Capillary: 140 mg/dL — ABNORMAL HIGH (ref 70–99)

## 2021-08-29 LAB — HEPATITIS B SURFACE ANTIGEN: Hepatitis B Surface Ag: NONREACTIVE

## 2021-08-29 MED ORDER — HEPARIN SODIUM (PORCINE) 1000 UNIT/ML IJ SOLN
INTRAMUSCULAR | Status: AC
  Filled 2021-08-29: qty 1

## 2021-08-29 NOTE — Progress Notes (Signed)
Subjective: 2 Days Post-Op Procedure(s) (LRB): DIALYSIS/PERMA CATHETER INSERTION (Right)   Patient is postop day 3 from left hip hemiarthroplasty.  He is doing fairly well.  Still complains of pain.  Has been out of bed to the commode.  Nephrology is still dialyzing him.  [facility].  Patient reports pain as mild.  Objective:   VITALS:   Vitals:   08/29/21 1215 08/29/21 1239  BP: (!) 156/63   Pulse:    Resp: 17 15  Temp:    SpO2:      Left hip is stable.  Neurovascular status is good.  dressing is dry.  LABS Recent Labs    08/28/21 0553 08/29/21 0935  HGB 8.5* 8.8*  HCT 25.5* 26.3*  WBC 13.8* 14.1*  PLT 207 225    Recent Labs    08/28/21 0553  NA 136  K 3.6  BUN 82*  CREATININE 9.44*  GLUCOSE 84    No results for input(s): LABPT, INR in the last 72 hours.   Assessment/Plan: 2 Days Post-Op Procedure(s) (LRB): DIALYSIS/PERMA CATHETER INSERTION (Right)   Discharge to SNF Partial weightbearing left leg with walker  Enteric-coated aspirin daily for DVT prophylaxis  Return to my office in 2 weeks for exam and x-ray

## 2021-08-29 NOTE — Assessment & Plan Note (Addendum)
--   doing ok status post surgery, management per orthopedics, plan for SNF today --continue partial weightbearing left leg with walker, enteric-coated aspirin daily for DVT prophylaxis, return to office in 2 weeks for exam and x-ray

## 2021-08-29 NOTE — Assessment & Plan Note (Signed)
--   CBG stable continue sliding scale insulin, can resume meal coverage on discharge

## 2021-08-29 NOTE — Plan of Care (Signed)
  Problem: Education: Goal: Ability to describe self-care measures that may prevent or decrease complications (Diabetes Survival Skills Education) will improve Outcome: Progressing Goal: Individualized Educational Video(s) Outcome: Progressing   Problem: Fluid Volume: Goal: Ability to maintain a balanced intake and output will improve Outcome: Progressing   Problem: Health Behavior/Discharge Planning: Goal: Ability to identify and utilize available resources and services will improve Outcome: Progressing Goal: Ability to manage health-related needs will improve Outcome: Progressing   Problem: Metabolic: Goal: Ability to maintain appropriate glucose levels will improve Outcome: Progressing   Problem: Nutritional: Goal: Progress toward achieving an optimal weight will improve Outcome: Progressing   Problem: Skin Integrity: Goal: Risk for impaired skin integrity will decrease Outcome: Progressing   Problem: Tissue Perfusion: Goal: Adequacy of tissue perfusion will improve Outcome: Progressing   Problem: Education: Goal: Knowledge of General Education information will improve Description: Including pain rating scale, medication(s)/side effects and non-pharmacologic comfort measures Outcome: Progressing   Problem: Health Behavior/Discharge Planning: Goal: Ability to manage health-related needs will improve Outcome: Progressing   Problem: Clinical Measurements: Goal: Ability to maintain clinical measurements within normal limits will improve Outcome: Progressing Goal: Will remain free from infection Outcome: Progressing Goal: Diagnostic test results will improve Outcome: Progressing Goal: Respiratory complications will improve Outcome: Progressing Goal: Cardiovascular complication will be avoided Outcome: Progressing   Problem: Activity: Goal: Risk for activity intolerance will decrease Outcome: Progressing   Problem: Nutrition: Goal: Adequate nutrition will be  maintained Outcome: Progressing   Problem: Coping: Goal: Level of anxiety will decrease Outcome: Progressing   Problem: Elimination: Goal: Will not experience complications related to bowel motility Outcome: Progressing Goal: Will not experience complications related to urinary retention Outcome: Progressing   Problem: Pain Managment: Goal: General experience of comfort will improve Outcome: Progressing   Problem: Safety: Goal: Ability to remain free from injury will improve Outcome: Progressing   Problem: Skin Integrity: Goal: Risk for impaired skin integrity will decrease Outcome: Progressing

## 2021-08-29 NOTE — Assessment & Plan Note (Addendum)
--   On peritoneal dialysis at home.  Temporary HD for SNF.  Has chair time for HD Monday and Friday.

## 2021-08-29 NOTE — Hospital Course (Addendum)
80 year old man presented after mechanical fall resulting in left hip fracture. --10/8 admit --10/9 unable to place foley, urology consulted for inability to place foley, cystoscopic guided complex catheter placement  --10/9 left hip hemiarthroplasty --10/10 placement tunneled hemodialysis catheter  Consults Nephrology  Orthopedics Urology  Vascular surgery

## 2021-08-29 NOTE — Progress Notes (Signed)
Patient is alert and oriented with no complaints and completed a net UF goal of 1500

## 2021-08-29 NOTE — Assessment & Plan Note (Addendum)
--   Seen by urology, continue Flomax, Foley catheter was placed under anesthesia, plan to leave in place given bulbar urethral narrowing, follow-up with urology as an outpatient, consider voiding trial 10/16.

## 2021-08-29 NOTE — Progress Notes (Signed)
  Progress Note    Manuel Klein   PTW:[id number]  DOB: July 26, 1941  DOA: 08/25/2021     4 Date of Service: 08/29/2021   80 year old man presented after mechanical fall resulting in left hip fracture. --10/9 left hip hemiarthroplasty --10/10 placement dialysis catheter  Hospital Problems Closed left hip fracture [facility]) -- Status post surgery, management per orthopedics, plan for SNF  Acute urinary retention -- Seen by urology, continue Flomax, Foley catheter was placed under anesthesia, plan to leave in place given bulbar urethral narrowing, follow-up with urology as an outpatient.  ESRD (end stage renal disease) ([place]) -- On peritoneal dialysis at home.  Temporary HD for SNF.  Has chair time.  Anticipate discharge tomorrow.  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[place]) --stable  Primary hypertension -- Stable, continue amlodipine, hydralazine, losartan, metoprolol  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HCC) -- CBG stable continue sliding scale insulin, can resume meal coverage on discharge  BPH (benign prostatic hyperplasia) --stable, continue Flomax  Subjective:  Overall feels okay but appetite is poor, pain medicine does help  Objective Vital signs were reviewed and unremarkable.  Vitals:   08/29/21 1130 08/29/21 1200 08/29/21 1215 08/29/21 1239  BP: (!) 130/58  (!) 156/63   Pulse:      Resp: 20 18 17 15   Temp:      TempSrc:      SpO2:      Weight:      Height:       80 kg  Exam Physical Exam Constitutional:      General: He is not in acute distress. Cardiovascular:     Rate and Rhythm: Normal rate and regular rhythm.     Heart sounds: No murmur heard. Pulmonary:     Effort: Pulmonary effort is normal. No respiratory distress.     Breath sounds: No wheezing or rales.  Musculoskeletal:     Right lower leg: No edema.     Left lower leg: No edema.  Neurological:     Mental Status: He is alert.  Psychiatric:         Mood and Affect: Mood normal.     Labs / Other Information Hgb stable 8.8    Time spent: 20 minutes [facility] 08/29/2021, 7:11 PM

## 2021-08-29 NOTE — Assessment & Plan Note (Signed)
--   Stable, continue amlodipine, hydralazine, losartan, metoprolol

## 2021-08-29 NOTE — Assessment & Plan Note (Signed)
stable °

## 2021-08-29 NOTE — Progress Notes (Signed)
[facility]  ROUNDING NOTE   Subjective:   [name] was admitted to [facility] on 08/25/2021 for Closed left hip fracture [facility]) [S72.002A] Closed fracture of left hip, initial encounter ([place]) [S72.002A] Fall, initial encounter [W19.XXXA]  Patient seen and evaluated during dialysis    HEMODIALYSIS FLOWSHEET:  Blood Flow Rate (mL/min): 400 mL/min Arterial Pressure (mmHg): -170 mmHg Venous Pressure (mmHg): 120 mmHg Transmembrane Pressure (mmHg): 50 mmHg Ultrafiltration Rate (mL/min): 500 mL/min Dialysate Flow Rate (mL/min): 500 ml/min Conductivity: Machine : 13.9 Conductivity: Machine : 13.9 Dialysis Fluid Bolus: Normal Saline Bolus Amount (mL): 250 mL Dialysate Change: Other (comment)  Patient seated in a chair for today's treatment, tolerating well No complaints at this time  Objective:  Vital signs in last 24 hours:  Temp:  [98 F (36.7 C)-98.7 F (37.1 C)] 98.2 F (36.8 C) (10/12 0910) Pulse Rate:  [70-76] 71 (10/12 0726) Resp:  [14-20] 20 (10/12 1130) BP: (108-159)/(53-71) 130/58 (10/12 1130) SpO2:  [88 %-97 %] 88 % (10/12 0726)  Weight change:  Filed Weights   08/25/21 2214  Weight: 80 kg    Intake/Output: I/O last 3 completed shifts: In: 74 [P.O.:335] Out: 1550 [Urine:550; Other:1000]   Intake/Output this shift:  No intake/output data recorded.  Physical Exam: General: NAD, sitting in chair  Head: Normocephalic, atraumatic. Moist oral mucosal membranes  Eyes: Anicteric  Lungs:  Crackles bilaterally, [place] 2L  Heart: Regular rate and rhythm  Abdomen:  Soft, nontender  Extremities:  no peripheral edema  Neurologic: Nonfocal, moving all four extremities  Skin: No lesions  Access: PD catheter, Rt IJ Permcath placed on 16/38/46    Basic Metabolic Panel: Recent Labs  Lab 08/25/21 2228 08/26/21 0453 08/28/21 0553  NA 138 139 136  K 3.5 3.4* 3.6  CL 100 102 102  CO2 25 24 21*  GLUCOSE 199* 192* 84  BUN 67* 69* 82*  CREATININE  7.67* 7.53* 9.44*  CALCIUM 7.9* 7.7* 7.8*     Liver Function Tests: No results for input(s): AST, ALT, ALKPHOS, BILITOT, PROT, ALBUMIN in the last 168 hours. No results for input(s): LIPASE, AMYLASE in the last 168 hours. No results for input(s): AMMONIA in the last 168 hours.  CBC: Recent Labs  Lab 08/25/21 2228 08/26/21 0453 08/28/21 0553 08/29/21 0935  WBC 16.3* 14.7* 13.8* 14.1*  NEUTROABS 10.1*  --   --   --   HGB 11.0* 9.6* 8.5* 8.8*  HCT 32.9* 28.9* 25.5* 26.3*  MCV 92.7 93.5 92.1 92.9  PLT 270 221 207 225     Cardiac Enzymes: No results for input(s): CKTOTAL, CKMB, CKMBINDEX, TROPONINI in the last 168 hours.  BNP: Invalid input(s): POCBNP  CBG: Recent Labs  Lab 08/28/21 0729 08/28/21 1303 08/28/21 1622 08/28/21 2110 08/29/21 0756  GLUCAP 81 82 111* 202* 120*     Microbiology: Results for orders placed or performed during the hospital encounter of 08/25/21  Resp Panel by RT-PCR (Flu A&B, Covid) Nasopharyngeal Swab     Status: None   Collection Time: 08/25/21 11:10 PM   Specimen: Nasopharyngeal Swab; Nasopharyngeal(NP) swabs in vial transport medium  Result Value Ref Range Status   SARS Coronavirus 2 by RT PCR NEGATIVE NEGATIVE Final    Comment: (NOTE) SARS-CoV-2 target nucleic acids are NOT DETECTED.  The SARS-CoV-2 RNA is generally detectable in upper respiratory specimens during the acute phase of infection. The lowest concentration of SARS-CoV-2 viral copies this assay can detect is 138 copies/mL. A negative result does not preclude  SARS-Cov-2 infection and should not be used as the sole basis for treatment or other patient management decisions. A negative result may occur with  improper specimen collection/handling, submission of specimen other than nasopharyngeal swab, presence of viral mutation(s) within the areas targeted by this assay, and inadequate number of viral copies(<138 copies/mL). A negative result must be combined with clinical  observations, patient history, and epidemiological information. The expected result is Negative.  Fact Sheet for Patients:  EntrepreneurPulse.com.au  Fact Sheet for Healthcare Providers:  IncredibleEmployment.be  This test is no t yet approved or cleared by the [place] FDA and  has been authorized for detection and/or diagnosis of SARS-CoV-2 by FDA under an Emergency Use Authorization (EUA). This EUA will remain  in effect (meaning this test can be used) for the duration of the COVID-19 declaration under Section 564(b)(1) of the Act, 21 U.S.C.section 360bbb-3(b)(1), unless the authorization is terminated  or revoked sooner.       Influenza A by PCR NEGATIVE NEGATIVE Final   Influenza B by PCR NEGATIVE NEGATIVE Final    Comment: (NOTE) The Xpert Xpress SARS-CoV-2/FLU/RSV plus assay is intended as an aid in the diagnosis of influenza from Nasopharyngeal swab specimens and should not be used as a sole basis for treatment. Nasal washings and aspirates are unacceptable for Xpert Xpress SARS-CoV-2/FLU/RSV testing.  Fact Sheet for Patients: EntrepreneurPulse.com.au  Fact Sheet for Healthcare Providers: IncredibleEmployment.be  This test is not yet approved or cleared by the [place] FDA and has been authorized for detection and/or diagnosis of SARS-CoV-2 by FDA under an Emergency Use Authorization (EUA). This EUA will remain in effect (meaning this test can be used) for the duration of the COVID-19 declaration under Section 564(b)(1) of the Act, 21 U.S.C. section 360bbb-3(b)(1), unless the authorization is terminated or revoked.  Performed at [facility], [address], [place], [place] [address]   Surgical PCR screen     Status: None   Collection Time: 08/26/21  4:27 AM   Specimen: Nasal Mucosa; Nasal Swab  Result Value Ref Range Status   MRSA, PCR NEGATIVE NEGATIVE Final    Staphylococcus aureus NEGATIVE NEGATIVE Final    Comment: (NOTE) The Xpert SA Assay (FDA approved for NASAL specimens in patients 40 years of age and older), is one component of a comprehensive surveillance program. It is not intended to diagnose infection nor to guide or monitor treatment. Performed at [facility], [address] [place], [place] [address]   Resp Panel by RT-PCR (Flu A&B, Covid) Nasopharyngeal Swab     Status: None   Collection Time: 08/28/21  4:58 PM   Specimen: Nasopharyngeal Swab; Nasopharyngeal(NP) swabs in vial transport medium  Result Value Ref Range Status   SARS Coronavirus 2 by RT PCR NEGATIVE NEGATIVE Final    Comment: (NOTE) SARS-CoV-2 target nucleic acids are NOT DETECTED.  The SARS-CoV-2 RNA is generally detectable in upper respiratory specimens during the acute phase of infection. The lowest concentration of SARS-CoV-2 viral copies this assay can detect is 138 copies/mL. A negative result does not preclude SARS-Cov-2 infection and should not be used as the sole basis for treatment or other patient management decisions. A negative result may occur with  improper specimen collection/handling, submission of specimen other than nasopharyngeal swab, presence of viral mutation(s) within the areas targeted by this assay, and inadequate number of viral copies(<138 copies/mL). A negative result must be combined with clinical observations, patient history, and epidemiological information. The expected result is Negative.  Fact Sheet for Patients:  EntrepreneurPulse.com.au  Fact Sheet for Healthcare Providers:  IncredibleEmployment.be  This test is no t yet approved or cleared by the [place] FDA and  has been authorized for detection and/or diagnosis of SARS-CoV-2 by FDA under an Emergency Use Authorization (EUA). This EUA will remain  in effect (meaning this test can be used) for the duration of  the COVID-19 declaration under Section 564(b)(1) of the Act, 21 U.S.C.section 360bbb-3(b)(1), unless the authorization is terminated  or revoked sooner.       Influenza A by PCR NEGATIVE NEGATIVE Final   Influenza B by PCR NEGATIVE NEGATIVE Final    Comment: (NOTE) The Xpert Xpress SARS-CoV-2/FLU/RSV plus assay is intended as an aid in the diagnosis of influenza from Nasopharyngeal swab specimens and should not be used as a sole basis for treatment. Nasal washings and aspirates are unacceptable for Xpert Xpress SARS-CoV-2/FLU/RSV testing.  Fact Sheet for Patients: EntrepreneurPulse.com.au  Fact Sheet for Healthcare Providers: IncredibleEmployment.be  This test is not yet approved or cleared by the [place] FDA and has been authorized for detection and/or diagnosis of SARS-CoV-2 by FDA under an Emergency Use Authorization (EUA). This EUA will remain in effect (meaning this test can be used) for the duration of the COVID-19 declaration under Section 564(b)(1) of the Act, 21 U.S.C. section 360bbb-3(b)(1), unless the authorization is terminated or revoked.  Performed at [facility], [address] [place], [place] [address]     Coagulation Studies: No results for input(s): LABPROT, INR in the last 72 hours.   Urinalysis: No results for input(s): COLORURINE, LABSPEC, PHURINE, GLUCOSEU, HGBUR, BILIRUBINUR, KETONESUR, PROTEINUR, UROBILINOGEN, NITRITE, LEUKOCYTESUR in the last 72 hours.  Invalid input(s): APPERANCEUR    Imaging: PERIPHERAL VASCULAR CATHETERIZATION  Result Date: 08/27/2021 See surgical note for result.    Medications:    sodium chloride 1 mL/hr at 08/26/21 1720   methocarbamol (ROBAXIN) IV     tranexamic acid      heparin sodium (porcine)       amLODipine  5 mg Oral Daily   aspirin EC  325 mg Oral Q breakfast   Chlorhexidine Gluconate Cloth  6 each Topical Daily   docusate sodium  100 mg Oral BID    ferrous sulfate  325 mg Oral Q breakfast   [START ON 08/30/2021] furosemide  120 mg Oral Q T,Th,Sat-1800   hydrALAZINE  25 mg Oral BID   insulin aspart  0-5 Units Subcutaneous QHS   insulin aspart  0-9 Units Subcutaneous TID WC   levothyroxine  100 mcg Oral Q0600   losartan  100 mg Oral Daily   magnesium oxide  400 mg Oral Daily   metoprolol tartrate  25 mg Oral BID   multivitamin  1 tablet Oral Daily   mupirocin ointment  1 application Nasal BID   pantoprazole  40 mg Oral BID   rosuvastatin  10 mg Oral Daily   sevelamer carbonate  800 mg Oral BID WC   sodium chloride flush  3 mL Intravenous Q12H   tamsulosin  0.4 mg Oral Daily   vitamin B-12  5,000 mcg Oral Daily   sodium chloride, acetaminophen **OR** acetaminophen, albuterol, alum & mag hydroxide-simeth, bisacodyl, HYDROcodone-acetaminophen, HYDROmorphone (DILAUDID) injection, menthol-cetylpyridinium **OR** phenol, methocarbamol **OR** methocarbamol (ROBAXIN) IV, morphine injection, morphine injection, ondansetron **OR** ondansetron (ZOFRAN) IV, ondansetron (ZOFRAN) IV, sodium chloride flush, sodium phosphate, traZODone, zolpidem  Assessment/ Plan:  [name] is a 80 y.o. white male with end stage renal  disease on peritoneal dialysis, hypertension, diabetes mellitus type II, hypothyroidism, GERD, hyperlipidemia, BPH, congestive heart failure, asthma who is admitted to [facility] on 08/25/2021 for Closed left hip fracture ([place]) [S72.002A] Closed fracture of left hip, initial encounter ([place]) [S72.002A] Fall, initial encounter [W19.XXXA]  [facility] CCPD 8 hours 4 exchanges 204mL fills   End Stage Renal Disease: on peritoneal dialysis. Last treatment was Friday night. Wife and patient report no issues on peritoneal dialysis. Tolerating treatments well at home. Plan on transitioning patient to intermittent in center hemodialysis while patient is in rehab. Rt IJ Permcath placed by Vascular on  08/27/21. -Received initial hemodialysis treatment yesterday, tolerated well.  Received dialysis today seated in chair, tolerated well.  UF 1 L achieved - Will transition Furosemide 120mg  tab daily to non-dialysis days -Dialysis coordinator aware of patient and outpatient needs currently in progress  Hypertension: elevated at 156/63. Home regimen of furosemide, losartan, metoprolol, amlodipine and tamsulosin.  Continue current treatment  Anemia of chronic kidney disease: hemoglobin 8.8. normocytic. ESA as outpatient.   Secondary Hyperparathyroidism: with hyperphosphatemia on 9/9, phos of 6.3.  - Continue sevelamer with meals.     LOS: College 10/12/202212:35 PM

## 2021-08-29 NOTE — Assessment & Plan Note (Signed)
--  stable, continue Flomax

## 2021-08-29 NOTE — Progress Notes (Signed)
Physical Therapy Treatment Patient Details Name: Manuel Klein MRN: [id number] DOB: Nov 20, 1940 Today's Date: 08/29/2021   History of Present Illness Pt is a 80 y.o. male presenting to hospital 10/8 with L leg pain s/p fall in shower.  Pt admitted with closed L hip/femoral neck fx secondary to mechanical fall.  S/p L hip hemiarthroplasty secondary to displaced subcapital fx of L hip 08/26/2021.  PMH includes asthma, CHF, CKD stage IV, CAD, DM, htn, MI, sz's, ESRD on PD, symptomatic anemia.    PT Comments    Pt was long sitting in bed with supportive spouse at bedside. Pt has HD and per MD request, "pt needs to be in chair x 4 hours." He is alert and oriented x 3 but unable to recall hip precautions. Was able to exit R side of bed, stand, and pivot to HD chair. Pt requires +2 assistance for safety. RN aware of pt's abilities. He will require extensive PT going forward to maximize independence with ADLs. Will return later to assist pt with returning to bed. Recommend RN staff use hoyer for transfers currently.    Recommendations for follow up therapy are one component of a multi-disciplinary discharge planning process, led by the attending physician.  Recommendations may be updated based on patient status, additional functional criteria and insurance authorization.  Follow Up Recommendations  SNF     Equipment Recommendations  Other (comment) (defer to next level of care)       Precautions / Restrictions Precautions Precautions: Fall;Posterior Hip Precaution Booklet Issued: Yes (comment) Precaution Comments: Foley catheter; peritoneal dialysis site; R internal jugular vein tunnelled HD cath Restrictions Weight Bearing Restrictions: Yes LLE Weight Bearing: Partial weight bearing Other Position/Activity Restrictions: Bucks Traction     Mobility  Bed Mobility Overal bed mobility: Needs Assistance Bed Mobility: Supine to Sit;Rolling;Sidelying to Sit Rolling: Max assist Sidelying to  sit: Max assist Supine to sit: Max assist;+2 for safety/equipment (HOB elevated ~ 15 degrees)     General bed mobility comments: pt was able to log roll R to short sit with increased time and max assist. severely limited by pain in LLE    Transfers Overall transfer level: Needs assistance Equipment used: Rolling walker (2 wheeled) Transfers: Sit to/from [facility] Sit to Stand: Min assist;+2 physical assistance;+2 safety/equipment;From elevated surface Stand pivot transfers: +2 physical assistance;+2 safety/equipment;From elevated surface;Mod assist       General transfer comment: pt was able to stand and pivot with use of RW + 2 assist. Pt sitting in dialysis chair at conclusion of session. must tolerate 4 hours in chair for prep for rehab  Ambulation/Gait    General Gait Details: Was able to pivot to recliner. difficulty mainting PWB in standing.    Balance Overall balance assessment: Needs assistance Sitting-balance support: Bilateral upper extremity supported;Feet supported Sitting balance-Leahy Scale: Good Sitting balance - Comments: steady static sitting   Standing balance support: Bilateral upper extremity supported Standing balance-Leahy Scale: Poor      Cognition Arousal/Alertness: Awake/alert Behavior During Therapy: Flat affect Overall Cognitive Status: Within Functional Limits for tasks assessed      General Comments: pt is A and O x 3. Unable to recall hip precautions             Pertinent Vitals/Pain Pain Assessment: 0-10 Pain Score: 6  Pain Location: L hip/thigh Pain Descriptors / Indicators: Aching;Sore;Tender Pain Intervention(s): Limited activity within patient's tolerance;Monitored during session;Repositioned     PT Goals (current goals can now be found in  the care plan section) Acute Rehab PT Goals Patient Stated Goal: to improve pain and mobility Progress towards PT goals: Progressing toward goals    Frequency     7X/week      PT Plan Current plan remains appropriate       AM-PAC PT "6 Clicks" Mobility   Outcome Measure  Help needed turning from your back to your side while in a flat bed without using bedrails?: A Little Help needed moving from lying on your back to sitting on the side of a flat bed without using bedrails?: A Lot Help needed moving to and from a bed to a chair (including a wheelchair)?: A Lot Help needed standing up from a chair using your arms (e.g., wheelchair or bedside chair)?: A Lot Help needed to walk in hospital room?: Total Help needed climbing 3-5 steps with a railing? : Total 6 Click Score: 11    End of Session Equipment Utilized During Treatment: Gait belt Activity Tolerance: Patient limited by pain;Patient tolerated treatment well Patient left: in chair;with call bell/phone within reach;with family/visitor present;with nursing/sitter in room Nurse Communication: Mobility status;Precautions;Weight bearing status PT Visit Diagnosis: Other abnormalities of gait and mobility (R26.89);Muscle weakness (generalized) (M62.81);History of falling (Z91.81);Pain Pain - Right/Left: Left Pain - part of body: Hip     Time: 0321-2248 PT Time Calculation (min) (ACUTE ONLY): 26 min  Charges:  $Therapeutic Activity: 23-37 mins                     Julaine Fusi PTA 08/29/21, 9:42 AM

## 2021-08-30 ENCOUNTER — Inpatient Hospital Stay: Payer: Medicare Other

## 2021-08-30 DIAGNOSIS — B172 Acute hepatitis E: Secondary | ICD-10-CM

## 2021-08-30 DIAGNOSIS — R918 Other nonspecific abnormal finding of lung field: Secondary | ICD-10-CM

## 2021-08-30 DIAGNOSIS — Z96649 Presence of unspecified artificial hip joint: Secondary | ICD-10-CM

## 2021-08-30 DIAGNOSIS — D721 Eosinophilia, unspecified: Secondary | ICD-10-CM

## 2021-08-30 LAB — HEPATITIS B DNA, ULTRAQUANTITATIVE, PCR
HBV DNA SERPL PCR-ACNC: NOT DETECTED IU/mL
HBV DNA SERPL PCR-LOG IU: UNDETERMINED log10 IU/mL

## 2021-08-30 LAB — GLUCOSE, CAPILLARY
Glucose-Capillary: 92 mg/dL (ref 70–99)
Glucose-Capillary: 133 mg/dL — ABNORMAL HIGH (ref 70–99)
Glucose-Capillary: 151 mg/dL — ABNORMAL HIGH (ref 70–99)

## 2021-08-30 LAB — HEPATITIS B E ANTIGEN: Hep B E Ag: POSITIVE — AB

## 2021-08-30 MED ORDER — HYDROCODONE-ACETAMINOPHEN 5-325 MG PO TABS: 1.0000 | ORAL_TABLET | Freq: Four times a day (QID) | ORAL | 0 refills | Status: AC | PRN

## 2021-08-30 MED ORDER — FUROSEMIDE 40 MG PO TABS: 120.0000 mg | ORAL_TABLET | ORAL | Status: AC

## 2021-08-30 MED ORDER — ACETAMINOPHEN 325 MG PO TABS: 650.0000 mg | ORAL_TABLET | Freq: Four times a day (QID) | ORAL | Status: AC | PRN

## 2021-08-30 MED ORDER — ASPIRIN 325 MG PO TBEC: 325.0000 mg | DELAYED_RELEASE_TABLET | Freq: Every day | ORAL | Status: AC

## 2021-08-30 NOTE — Progress Notes (Signed)
Physical Therapy Treatment Patient Details Name: Manuel Klein MRN: [id number] DOB: Jun 21, 1941 Today's Date: 08/30/2021   History of Present Illness Pt is a 80 y.o. male presenting to hospital 10/8 with L leg pain s/p fall in shower.  Pt admitted with closed L hip/femoral neck fx secondary to mechanical fall.  S/p L hip hemiarthroplasty secondary to displaced subcapital fx of L hip 08/26/2021.  PMH includes asthma, CHF, CKD stage IV, CAD, DM, htn, MI, sz's, ESRD on PD, symptomatic anemia.    PT Comments    Pt resting in bed, wife at bedside. Pt last received pain meds 6 hours ago, yet wanted to attempt mobility. L LE Bucks traction removed for isometric exercises and AAROM to tolerance (modified).  Pt assisted to EOB with MaxA, use of side rail, with increased time to control pain level. Pt sat EOB x 5 minutes. Sit to stand several times with MaxA at RW, PWB L LE.  Pt unable to attain upright standing from raised bed due to c/o pain and weakness. Pt's wife stated he was having difficulty getting up from sitting before his fall.  Pt unable to safely transition to bedside recliner and was assisted back to bed with MaxA and placed back into traction resting comfortably. Plan for pt to transition to [facility] today where PT services with continue.   Recommendations for follow up therapy are one component of a multi-disciplinary discharge planning process, led by the attending physician.  Recommendations may be updated based on patient status, additional functional criteria and insurance authorization.  Follow Up Recommendations  SNF     Equipment Recommendations  Other (comment) (defer to next level of care)    Recommendations for Other Services       Precautions / Restrictions Precautions Precautions: Fall;Posterior Hip Precaution Booklet Issued: Yes (comment) Precaution Comments: Foley catheter; peritoneal dialysis site; R internal jugular vein tunnelled HD cath Restrictions Weight  Bearing Restrictions: Yes LLE Weight Bearing: Partial weight bearing Other Position/Activity Restrictions: Bucks Traction     Mobility  Bed Mobility Overal bed mobility: Needs Assistance Bed Mobility: Sit to Supine Rolling: Max assist Sidelying to sit: Max assist Supine to sit: Max assist Sit to supine: Max assist   General bed mobility comments:  (Utilized chuck pad to gently transition pt supine > sitting EOB)    Transfers Overall transfer level: Needs assistance Equipment used: Rolling walker (2 wheeled) Transfers: Sit to/from Stand Sit to Stand: Max assist;From elevated surface         General transfer comment:  (Pt had not had pain meds in 6 hours, but wanted to try. Pt only able to tolerated standing at bedside with RW)  Ambulation/Gait             General Gait Details: unable due to inability to perform [facility]   Stairs             Wheelchair Mobility    Modified Rankin (Stroke Patients Only)       Balance Overall balance assessment: Needs assistance Sitting-balance support: Bilateral upper extremity supported;Feet supported Sitting balance-Leahy Scale: Good Sitting balance - Comments: steady static sitting   Standing balance support: Bilateral upper extremity supported Standing balance-Leahy Scale: Poor Standing balance comment: Pt unable to come into standing, 2/2 lethargy, weakness                            Cognition Arousal/Alertness: Awake/alert Behavior During Therapy: Flat affect Overall Cognitive Status:  Within Functional Limits for tasks assessed                                 General Comments:  (Less confused, but easily falls asleep if not actively engaged)      Exercises      General Comments        Pertinent Vitals/Pain Pain Assessment: 0-10 Pain Score: 4  Pain Location: L hip/thigh Pain Descriptors / Indicators: Aching;Grimacing;Sore Pain Intervention(s): Limited activity within patient's  tolerance;Monitored during session    Home Living                      Prior Function            PT Goals (current goals can now be found in the care plan section) Acute Rehab PT Goals Patient Stated Goal: Get home with less pain    Frequency    7X/week      PT Plan Current plan remains appropriate    Co-evaluation              AM-PAC PT "6 Clicks" Mobility   Outcome Measure  Help needed turning from your back to your side while in a flat bed without using bedrails?: A Little Help needed moving from lying on your back to sitting on the side of a flat bed without using bedrails?: A Lot Help needed moving to and from a bed to a chair (including a wheelchair)?: A Lot Help needed standing up from a chair using your arms (e.g., wheelchair or bedside chair)?: A Lot Help needed to walk in hospital room?: Total Help needed climbing 3-5 steps with a railing? : Total 6 Click Score: 11    End of Session Equipment Utilized During Treatment: Gait belt Activity Tolerance: Patient tolerated treatment well;Patient limited by pain;Patient limited by fatigue Patient left: in bed;with call bell/phone within reach;with bed alarm set;with family/visitor present;with nursing/sitter in room Nurse Communication: Mobility status;Precautions;Weight bearing status PT Visit Diagnosis: Other abnormalities of gait and mobility (R26.89);Muscle weakness (generalized) (M62.81);History of falling (Z91.81);Pain Pain - Right/Left: Left Pain - part of body: Hip     Time: 5643-3295 PT Time Calculation (min) (ACUTE ONLY): 44 min  Charges:  $Therapeutic Exercise: 8-22 mins $Therapeutic Activity: 23-37 mins                    Mikel Cella, PTA   Josie Dixon 08/30/2021, 11:37 AM

## 2021-08-30 NOTE — Assessment & Plan Note (Addendum)
--  peripheral smear this hospitalization showed: "Absolute eosinophilia and intermittent absolute monocytosis noted since June 2022. Leukocytosis with absolute eosinophilia, neutrophilia, and monocytosis. Normocytic anemia. Unremarkble WBC, RBC, and platelet morphology. Given persistence of monocytosis and eosinophilia recommend hematology consultation as an outpatient for further evaluation of possible myeloproliferative neoplasm" --will refer to hematology as outpatient

## 2021-08-30 NOTE — Assessment & Plan Note (Signed)
--  Hep B E Ag positive, follow-up as outpatient

## 2021-08-30 NOTE — Discharge Summary (Addendum)
Physician Discharge Summary   Patient name: Manuel Klein  Admit date:     08/25/2021  Discharge date: 08/30/2021  Discharge Physician: Murray Hodgkins   PCP: Lynnell Jude, MD   Recommendations at discharge:  Closed left hip fracture [facility]) --continue partial weightbearing left leg with walker, enteric-coated aspirin daily for DVT prophylaxis, return to office in 2 weeks for exam and x-ray  ESRD (end stage renal disease) ([place]) -- On peritoneal dialysis at home.  Temporary HD for SNF.  Has chair time for HD Monday and Friday.  Acute urinary retention -- Seen by urology, continue Flomax, Foley catheter was placed under anesthesia, plan to leave in place given bulbar urethral narrowing, follow-up with urology as an outpatient, consider voiding trial 10/16.  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 [facility] Course   80 year old man presented after mechanical fall resulting in left hip fracture. --10/9 left hip hemiarthroplasty, foley catheter placed under anesthesia --10/10 placement dialysis catheter for temporary HD while at SNF --10/13 has chair at outpatient dialysis center.  Chart review revealed lung mass, eosinophilia  Consults Orthopedics Nephrology Urology Vascular Surgery   Closed left hip fracture [facility]) -- doing ok status post surgery, management per orthopedics, plan for SNF today --continue  partial weightbearing left leg with walker, enteric-coated aspirin daily for DVT prophylaxis, return to office in 2 weeks for exam and x-ray  Acute urinary retention -- Seen by urology, continue Flomax, Foley catheter was placed under anesthesia, plan to leave in place given bulbar urethral narrowing, follow-up with urology as an outpatient, consider voiding trial 10/16.  ESRD (end stage renal disease) ([place]) -- On peritoneal dialysis at home.  Temporary HD for SNF.  Has chair time for HD Monday and Friday.  Eosinophilia --peripheral smear this hospitalization showed: "Absolute eosinophilia and intermittent absolute monocytosis noted since June 2022. Leukocytosis with absolute eosinophilia, neutrophilia, and monocytosis. Normocytic anemia. Unremarkble WBC, RBC, and platelet morphology. Given persistence of monocytosis and eosinophilia recommend hematology consultation as an outpatient for further evaluation of possible myeloproliferative neoplasm" --will refer to hematology as outpatient  Lung mass --4 cm right upper lung opacity seen on admission CXR. Discussed w/ nephrology Dr Candiss Norse and radiology, will pursue noncontrast study as per nephrology, hopefully can be done prior to discharge. 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    Appearance: He is  not ill-appearing.  Cardiovascular:     Rate and Rhythm: Normal rate  and regular rhythm.     Heart sounds: Murmur heard.  Pulmonary:     Effort: Pulmonary effort is normal. No respiratory distress.     Breath sounds: No wheezing or rales.  Musculoskeletal:     Right lower leg: No edema.     Left lower leg: No edema.     Comments: Moves both extremities to command  Neurological:     Mental Status: He is alert.  Psychiatric:        Mood and Affect: Mood normal.     Disposition: Skilled nursing facility  Discharge time: greater than 30 minutes.  Contact information for follow-up providers     Earnestine Leys, MD Follow up in 2 week(s).   Specialty: Orthopedic Surgery Why: Please make appointment for my office in 2 weeks prior to the patient's discharge Contact information: [place] [address] [phone]         [facility] [facility] Follow up.   Why: office will contact you for appoinment about eosinophilia        [facility]. Schedule an appointment as soon as possible for a visit in 3 day(s).   Why: for voiding trial             Contact information for after-discharge care     Destination     [facility] Preferred SNF .   Service: Skilled Nursing Contact information: [address] [place]                     Allergies as of 08/30/2021       Reactions   Hydralazine    Other reaction(s): Dizziness, Muscle Pain Chest pain   Atorvastatin    Other reaction(s): Muscle Pain   Carvedilol Other (See Comments)   Dizziness, drunk feeling   Ciprofloxacin Rash   Penicillins Rash, Other (See Comments)   Has patient had a PCN reaction causing immediate rash, facial/tongue/throat swelling, SOB or lightheadedness with hypotension: No Has patient had a PCN reaction causing severe rash involving mucus membranes or skin necrosis: No Has patient had a PCN reaction that required hospitalization:  No Has patient had a PCN reaction occurring within the last 10 years: No If all of the above answers are "NO", then may proceed with Cephalosporin use.   Sulfa Antibiotics Rash   Sulfasalazine Rash        Medication List     STOP taking these medications    aspirin 81 MG tablet Replaced by: aspirin 325 MG EC tablet       TAKE these medications    acetaminophen 325 MG tablet Commonly known as: TYLENOL Take 2 tablets (650 mg total) by mouth every 6 (six) hours as needed for mild pain (or Fever >/= 101).   amLODipine 5 MG tablet Commonly known as: NORVASC Take 5 mg by mouth daily.   aspirin 325 MG EC tablet Take 1 tablet (325 mg total) by mouth daily with breakfast. For DVT prophylaxis Start taking on: August 31, 2021 Replaces: aspirin 81 MG tablet   Cyanocobalamin 5000 MCG Caps Take 5,000 Units by mouth daily.   furosemide 40 MG tablet Commonly known as: LASIX Take 3 tablets (120 mg total) by mouth every Tuesday, Thursday, and Saturday at 6 PM. Start taking on: September 01, 2021 What changed: when to take this  hydrALAZINE 25 MG tablet Commonly known as: APRESOLINE Take 25 mg by mouth 2 (two) times daily.   HYDROcodone-acetaminophen 5-325 MG tablet Commonly known as: NORCO/VICODIN Take 1-2 tablets by mouth every 6 (six) hours as needed for moderate pain or severe pain.   insulin lispro 100 UNIT/ML KiwkPen Commonly known as: HUMALOG Inject 2-4 Units into the skin 3 (three) times daily with meals.   levothyroxine 100 MCG tablet Commonly known as: SYNTHROID Take 100 mcg by mouth daily.   losartan 100 MG tablet Commonly known as: COZAAR Take 100 mg by mouth daily.   magnesium oxide 400 MG tablet Commonly known as: MAG-OX Take 400 mg by mouth daily.   metoprolol tartrate 25 MG tablet Commonly known as: LOPRESSOR Take 25 mg by mouth 2 (two) times daily.   multivitamin Tabs tablet Take 1 tablet by mouth daily.   pantoprazole 40 MG tablet Commonly  known as: Protonix Take 1 tablet (40 mg total) by mouth 2 (two) times daily.   rosuvastatin 10 MG tablet Commonly known as: CRESTOR Take 10 mg by mouth daily.   sevelamer carbonate 800 MG tablet Commonly known as: RENVELA Take 800 mg by mouth 2 (two) times daily.   tamsulosin 0.4 MG Caps capsule Commonly known as: FLOMAX Take 0.4 mg by mouth daily.   Ventolin HFA 108 (90 Base) MCG/ACT inhaler Generic drug: albuterol Inhale 2 puffs into the lungs every 4 (four) hours as needed for wheezing or shortness of breath.               Discharge Care Instructions  (From admission, onward)           Start     Ordered   08/30/21 0000  Discharge wound care:       Comments: Reinforce dressing. Call Dr. Ammie Ferrier office for questions.   08/30/21 1347            PERIPHERAL VASCULAR CATHETERIZATION  Result Date: 08/27/2021 See surgical note for result.  DG Chest Portable 1 View  Result Date: 08/25/2021 CLINICAL DATA:  No loc, slipped in shower, no hit head, no thinners, co of left leg pain, htn , swelling to left leg EXAM: PORTABLE CHEST 1 VIEW COMPARISON:  Chest x-ray 06/04/2019 FINDINGS: The heart and mediastinal contours are within normal limits. Aortic valve replacement. Atrial appendage clip. Aortic calcification. Approximately 4 cm right paramediastinal upper lung airspace opacity. No pulmonary edema. At least trace to small volume left pleural effusion. No right pleural effusion. No pneumothorax. No acute osseous abnormality.Intact appearing sternotomy wires. IMPRESSION: 1. Approximately 4 cm right paramediastinal upper lung airspace opacity. 2. At least trace to small volume left pleural effusion. 3. Recommend CT chest with intravenous contrast (not CTPA) for further evaluation. Electronically Signed   By: Iven Finn M.D.   On: 08/25/2021 23:32   DG Hip Port Unilat With Pelvis 1V Left  Result Date: 08/26/2021 CLINICAL DATA:  Postop left hemiarthroplasty EXAM: DG HIP  (WITH OR WITHOUT PELVIS) 1V PORT LEFT COMPARISON:  08/25/2021 FINDINGS: Left hip replacement. Normal alignment. No hardware complicating feature. IMPRESSION: Left hip replacement.  No visible complicating feature. Electronically Signed   By: Rolm Baptise M.D.   On: 08/26/2021 19:14   DG Femur Min 2 Views Left  Result Date: 08/25/2021 CLINICAL DATA:  Pain after slip and fall injury. EXAM: LEFT FEMUR 2 VIEWS COMPARISON:  11/22/2020 FINDINGS: Acute transverse fracture of the left femoral neck with varus angulation of the fracture fragments. No dislocation at the hip  joint. Visualized left hemipelvis appears intact. Midshaft and distal femur is intact. No dislocation at the knee joint. Extensive vascular calcifications in the soft tissues. Catheter coiled in the pelvis likely represents a peritoneal dialysis catheter. IMPRESSION: Acute transverse fracture of the left femoral neck with varus angulation of the fracture fragments. Electronically Signed   By: Lucienne Capers M.D.   On: 08/25/2021 23:02   Results for orders placed or performed during the hospital encounter of 08/25/21  Resp Panel by RT-PCR (Flu A&B, Covid) Nasopharyngeal Swab     Status: None   Collection Time: 08/25/21 11:10 PM   Specimen: Nasopharyngeal Swab; Nasopharyngeal(NP) swabs in vial transport medium  Result Value Ref Range Status   SARS Coronavirus 2 by RT PCR NEGATIVE NEGATIVE Final    Comment: (NOTE) SARS-CoV-2 target nucleic acids are NOT DETECTED.  The SARS-CoV-2 RNA is generally detectable in upper respiratory specimens during the acute phase of infection. The lowest concentration of SARS-CoV-2 viral copies this assay can detect is 138 copies/mL. A negative result does not preclude SARS-Cov-2 infection and should not be used as the sole basis for treatment or other patient management decisions. A negative result may occur with  improper specimen collection/handling, submission of specimen other than nasopharyngeal swab,  presence of viral mutation(s) within the areas targeted by this assay, and inadequate number of viral copies(<138 copies/mL). A negative result must be combined with clinical observations, patient history, and epidemiological information. The expected result is Negative.  Fact Sheet for Patients:  EntrepreneurPulse.com.au  Fact Sheet for Healthcare Providers:  IncredibleEmployment.be  This test is no t yet approved or cleared by the [place] FDA and  has been authorized for detection and/or diagnosis of SARS-CoV-2 by FDA under an Emergency Use Authorization (EUA). This EUA will remain  in effect (meaning this test can be used) for the duration of the COVID-19 declaration under Section 564(b)(1) of the Act, 21 U.S.C.section 360bbb-3(b)(1), unless the authorization is terminated  or revoked sooner.       Influenza A by PCR NEGATIVE NEGATIVE Final   Influenza B by PCR NEGATIVE NEGATIVE Final    Comment: (NOTE) The Xpert Xpress SARS-CoV-2/FLU/RSV plus assay is intended as an aid in the diagnosis of influenza from Nasopharyngeal swab specimens and should not be used as a sole basis for treatment. Nasal washings and aspirates are unacceptable for Xpert Xpress SARS-CoV-2/FLU/RSV testing.  Fact Sheet for Patients: EntrepreneurPulse.com.au  Fact Sheet for Healthcare Providers: IncredibleEmployment.be  This test is not yet approved or cleared by the [place] FDA and has been authorized for detection and/or diagnosis of SARS-CoV-2 by FDA under an Emergency Use Authorization (EUA). This EUA will remain in effect (meaning this test can be used) for the duration of the COVID-19 declaration under Section 564(b)(1) of the Act, 21 U.S.C. section 360bbb-3(b)(1), unless the authorization is terminated or revoked.  Performed at [facility], [address], [place], [place] [address]   Surgical PCR  screen     Status: None   Collection Time: 08/26/21  4:27 AM   Specimen: Nasal Mucosa; Nasal Swab  Result Value Ref Range Status   MRSA, PCR NEGATIVE NEGATIVE Final   Staphylococcus aureus NEGATIVE NEGATIVE Final    Comment: (NOTE) The Xpert SA Assay (FDA approved for NASAL specimens in patients 4 years of age and older), is one component of a comprehensive surveillance program. It is not intended to diagnose infection nor to guide or monitor treatment. Performed at [facility], [address] [place], [place] [address]   Resp Panel by RT-PCR (Flu A&B, Covid) Nasopharyngeal Swab     Status: None   Collection Time: 08/28/21  4:58 PM   Specimen: Nasopharyngeal Swab; Nasopharyngeal(NP) swabs in vial transport medium  Result Value Ref Range Status   SARS Coronavirus 2 by RT PCR NEGATIVE NEGATIVE Final    Comment: (NOTE) SARS-CoV-2 target nucleic acids are NOT DETECTED.  The SARS-CoV-2 RNA is generally detectable in upper respiratory specimens during the acute phase of infection. The lowest concentration of SARS-CoV-2 viral copies this assay can detect is 138 copies/mL. A negative result does not preclude SARS-Cov-2 infection and should not be used as the sole basis for treatment or other patient management decisions. A negative result may occur with  improper specimen collection/handling, submission of specimen other than nasopharyngeal swab, presence of viral mutation(s) within the areas targeted by this assay, and inadequate number of viral copies(<138 copies/mL). A negative result must be combined with clinical observations, patient history, and epidemiological information. The expected result is Negative.  Fact Sheet for Patients:  EntrepreneurPulse.com.au  Fact Sheet for Healthcare Providers:  IncredibleEmployment.be  This test is no t yet approved or cleared by the [place] FDA and  has been authorized for detection  and/or diagnosis of SARS-CoV-2 by FDA under an Emergency Use Authorization (EUA). This EUA will remain  in effect (meaning this test can be used) for the duration of the COVID-19 declaration under Section 564(b)(1) of the Act, 21 U.S.C.section 360bbb-3(b)(1), unless the authorization is terminated  or revoked sooner.       Influenza A by PCR NEGATIVE NEGATIVE Final   Influenza B by PCR NEGATIVE NEGATIVE Final    Comment: (NOTE) The Xpert Xpress SARS-CoV-2/FLU/RSV plus assay is intended as an aid in the diagnosis of influenza from Nasopharyngeal swab specimens and should not be used as a sole basis for treatment. Nasal washings and aspirates are unacceptable for Xpert Xpress SARS-CoV-2/FLU/RSV testing.  Fact Sheet for Patients: EntrepreneurPulse.com.au  Fact Sheet for Healthcare Providers: IncredibleEmployment.be  This test is not yet approved or cleared by the [place] FDA and has been authorized for detection and/or diagnosis of SARS-CoV-2 by FDA under an Emergency Use Authorization (EUA). This EUA will remain in effect (meaning this test can be used) for the duration of the COVID-19 declaration under Section 564(b)(1) of the Act, 21 U.S.C. section 360bbb-3(b)(1), unless the authorization is terminated or revoked.  Performed at [facility], [address] [place], [place] [address]     Signed:  Murray Hodgkins MD.  [facility] 08/30/2021, 1:50 PM

## 2021-08-30 NOTE — Progress Notes (Signed)
[facility]  ROUNDING NOTE   Subjective:   [name] was admitted to [facility] on 08/25/2021 for Closed left hip fracture [facility]) [S72.002A] Closed fracture of left hip, initial encounter ([place]) [S72.002A] Fall, initial encounter [W19.XXXA]  Patient seen laying in bed preparing to work with PT Wife at bedside Completed breakfast Complains of minimal pain Weaned to room air   Objective:  Vital signs in last 24 hours:  Temp:  [98 F (36.7 C)-100.1 F (37.8 C)] 98 F (36.7 C) (10/13 0749) Pulse Rate:  [66-78] 66 (10/13 0749) Resp:  [15-18] 18 (10/13 0749) BP: (155-160)/(54-63) 155/54 (10/13 0749) SpO2:  [91 %-95 %] 95 % (10/13 0749)  Weight change:  Filed Weights   08/25/21 2214  Weight: 80 kg    Intake/Output: I/O last 3 completed shifts: In: 120 [P.O.:120] Out: 1550 [Urine:550; Other:1000]   Intake/Output this shift:  No intake/output data recorded.  Physical Exam: General: NAD, laying in bed  Head: Normocephalic, atraumatic. Moist oral mucosal membranes  Eyes: Anicteric  Lungs:  Clear bilaterally , normal effort  Heart: Regular rate and rhythm  Abdomen:  Soft, nontender  Extremities:  no peripheral edema  Neurologic: Nonfocal, moving all four extremities  Skin: No lesions  Access: PD catheter, Rt IJ Permcath placed on 23/76/28    Basic Metabolic Panel: Recent Labs  Lab 08/25/21 2228 08/26/21 0453 08/28/21 0553  NA 138 139 136  K 3.5 3.4* 3.6  CL 100 102 102  CO2 25 24 21*  GLUCOSE 199* 192* 84  BUN 67* 69* 82*  CREATININE 7.67* 7.53* 9.44*  CALCIUM 7.9* 7.7* 7.8*     Liver Function Tests: No results for input(s): AST, ALT, ALKPHOS, BILITOT, PROT, ALBUMIN in the last 168 hours. No results for input(s): LIPASE, AMYLASE in the last 168 hours. No results for input(s): AMMONIA in the last 168 hours.  CBC: Recent Labs  Lab 08/25/21 2228 08/26/21 0453 08/28/21 0553 08/29/21 0935  WBC 16.3* 14.7* 13.8* 14.1*  NEUTROABS 10.1*   --   --   --   HGB 11.0* 9.6* 8.5* 8.8*  HCT 32.9* 28.9* 25.5* 26.3*  MCV 92.7 93.5 92.1 92.9  PLT 270 221 207 225     Cardiac Enzymes: No results for input(s): CKTOTAL, CKMB, CKMBINDEX, TROPONINI in the last 168 hours.  BNP: Invalid input(s): POCBNP  CBG: Recent Labs  Lab 08/29/21 0756 08/29/21 1315 08/29/21 1713 08/29/21 2153 08/30/21 0851  GLUCAP 120* 96 112* 140* 50     Microbiology: Results for orders placed or performed during the hospital encounter of 08/25/21  Resp Panel by RT-PCR (Flu A&B, Covid) Nasopharyngeal Swab     Status: None   Collection Time: 08/25/21 11:10 PM   Specimen: Nasopharyngeal Swab; Nasopharyngeal(NP) swabs in vial transport medium  Result Value Ref Range Status   SARS Coronavirus 2 by RT PCR NEGATIVE NEGATIVE Final    Comment: (NOTE) SARS-CoV-2 target nucleic acids are NOT DETECTED.  The SARS-CoV-2 RNA is generally detectable in upper respiratory specimens during the acute phase of infection. The lowest concentration of SARS-CoV-2 viral copies this assay can detect is 138 copies/mL. A negative result does not preclude SARS-Cov-2 infection and should not be used as the sole basis for treatment or other patient management decisions. A negative result may occur with  improper specimen collection/handling, submission of specimen other than nasopharyngeal swab, presence of viral mutation(s) within the areas targeted by this assay, and inadequate number of viral copies(<138 copies/mL). A negative result must  be combined with clinical observations, patient history, and epidemiological information. The expected result is Negative.  Fact Sheet for Patients:  EntrepreneurPulse.com.au  Fact Sheet for Healthcare Providers:  IncredibleEmployment.be  This test is no t yet approved or cleared by the [place] FDA and  has been authorized for detection and/or diagnosis of SARS-CoV-2 by FDA under an Emergency  Use Authorization (EUA). This EUA will remain  in effect (meaning this test can be used) for the duration of the COVID-19 declaration under Section 564(b)(1) of the Act, 21 U.S.C.section 360bbb-3(b)(1), unless the authorization is terminated  or revoked sooner.       Influenza A by PCR NEGATIVE NEGATIVE Final   Influenza B by PCR NEGATIVE NEGATIVE Final    Comment: (NOTE) The Xpert Xpress SARS-CoV-2/FLU/RSV plus assay is intended as an aid in the diagnosis of influenza from Nasopharyngeal swab specimens and should not be used as a sole basis for treatment. Nasal washings and aspirates are unacceptable for Xpert Xpress SARS-CoV-2/FLU/RSV testing.  Fact Sheet for Patients: EntrepreneurPulse.com.au  Fact Sheet for Healthcare Providers: IncredibleEmployment.be  This test is not yet approved or cleared by the [place] FDA and has been authorized for detection and/or diagnosis of SARS-CoV-2 by FDA under an Emergency Use Authorization (EUA). This EUA will remain in effect (meaning this test can be used) for the duration of the COVID-19 declaration under Section 564(b)(1) of the Act, 21 U.S.C. section 360bbb-3(b)(1), unless the authorization is terminated or revoked.  Performed at [facility], [address] [place], [place] [address]   Surgical PCR screen     Status: None   Collection Time: 08/26/21  4:27 AM   Specimen: Nasal Mucosa; Nasal Swab  Result Value Ref Range Status   MRSA, PCR NEGATIVE NEGATIVE Final   Staphylococcus aureus NEGATIVE NEGATIVE Final    Comment: (NOTE) The Xpert SA Assay (FDA approved for NASAL specimens in patients 49 years of age and older), is one component of a comprehensive surveillance program. It is not intended to diagnose infection nor to guide or monitor treatment. Performed at [facility], [address] [place],  [address]   Resp Panel by RT-PCR (Flu A&B, Covid)  Nasopharyngeal Swab     Status: None   Collection Time: 08/28/21  4:58 PM   Specimen: Nasopharyngeal Swab; Nasopharyngeal(NP) swabs in vial transport medium  Result Value Ref Range Status   SARS Coronavirus 2 by RT PCR NEGATIVE NEGATIVE Final    Comment: (NOTE) SARS-CoV-2 target nucleic acids are NOT DETECTED.  The SARS-CoV-2 RNA is generally detectable in upper respiratory specimens during the acute phase of infection. The lowest concentration of SARS-CoV-2 viral copies this assay can detect is 138 copies/mL. A negative result does not preclude SARS-Cov-2 infection and should not be used as the sole basis for treatment or other patient management decisions. A negative result may occur with  improper specimen collection/handling, submission of specimen other than nasopharyngeal swab, presence of viral mutation(s) within the areas targeted by this assay, and inadequate number of viral copies(<138 copies/mL). A negative result must be combined with clinical observations, patient history, and epidemiological information. The expected result is Negative.  Fact Sheet for Patients:  EntrepreneurPulse.com.au  Fact Sheet for Healthcare Providers:  IncredibleEmployment.be  This test is no t yet approved or cleared by the [place] FDA and  has been authorized for detection and/or diagnosis of SARS-CoV-2 by FDA under an Emergency Use Authorization (EUA). This EUA will remain  in effect (meaning this  test can be used) for the duration of the COVID-19 declaration under Section 564(b)(1) of the Act, 21 U.S.C.section 360bbb-3(b)(1), unless the authorization is terminated  or revoked sooner.       Influenza A by PCR NEGATIVE NEGATIVE Final   Influenza B by PCR NEGATIVE NEGATIVE Final    Comment: (NOTE) The Xpert Xpress SARS-CoV-2/FLU/RSV plus assay is intended as an aid in the diagnosis of influenza from Nasopharyngeal swab specimens and should not be  used as a sole basis for treatment. Nasal washings and aspirates are unacceptable for Xpert Xpress SARS-CoV-2/FLU/RSV testing.  Fact Sheet for Patients: EntrepreneurPulse.com.au  Fact Sheet for Healthcare Providers: IncredibleEmployment.be  This test is not yet approved or cleared by the [place] FDA and has been authorized for detection and/or diagnosis of SARS-CoV-2 by FDA under an Emergency Use Authorization (EUA). This EUA will remain in effect (meaning this test can be used) for the duration of the COVID-19 declaration under Section 564(b)(1) of the Act, 21 U.S.C. section 360bbb-3(b)(1), unless the authorization is terminated or revoked.  Performed at [facility], [address] [place], [place] [address]     Coagulation Studies: No results for input(s): LABPROT, INR in the last 72 hours.   Urinalysis: No results for input(s): COLORURINE, LABSPEC, PHURINE, GLUCOSEU, HGBUR, BILIRUBINUR, KETONESUR, PROTEINUR, UROBILINOGEN, NITRITE, LEUKOCYTESUR in the last 72 hours.  Invalid input(s): APPERANCEUR    Imaging: No results found.   Medications:    sodium chloride 1 mL/hr at 08/26/21 1720   methocarbamol (ROBAXIN) IV     tranexamic acid      amLODipine  5 mg Oral Daily   aspirin EC  325 mg Oral Q breakfast   Chlorhexidine Gluconate Cloth  6 each Topical Daily   docusate sodium  100 mg Oral BID   ferrous sulfate  325 mg Oral Q breakfast   furosemide  120 mg Oral Q T,Th,Sat-1800   hydrALAZINE  25 mg Oral BID   insulin aspart  0-5 Units Subcutaneous QHS   insulin aspart  0-9 Units Subcutaneous TID WC   levothyroxine  100 mcg Oral Q0600   losartan  100 mg Oral Daily   magnesium oxide  400 mg Oral Daily   metoprolol tartrate  25 mg Oral BID   multivitamin  1 tablet Oral Daily   mupirocin ointment  1 application Nasal BID   pantoprazole  40 mg Oral BID   rosuvastatin  10 mg Oral Daily   sevelamer carbonate  800 mg  Oral BID WC   sodium chloride flush  3 mL Intravenous Q12H   tamsulosin  0.4 mg Oral Daily   vitamin B-12  5,000 mcg Oral Daily   sodium chloride, acetaminophen **OR** acetaminophen, albuterol, alum & mag hydroxide-simeth, bisacodyl, HYDROcodone-acetaminophen, HYDROmorphone (DILAUDID) injection, menthol-cetylpyridinium **OR** phenol, methocarbamol **OR** methocarbamol (ROBAXIN) IV, morphine injection, morphine injection, ondansetron **OR** ondansetron (ZOFRAN) IV, sodium chloride flush, sodium phosphate, traZODone, zolpidem  Assessment/ Plan:  Manuel Klein is a 80 y.o. white male with end stage renal disease on peritoneal dialysis, hypertension, diabetes mellitus type II, hypothyroidism, GERD, hyperlipidemia, BPH, congestive heart failure, asthma who is admitted to [facility] on 08/25/2021 for Closed left hip fracture ([place]) [S72.002A] Closed fracture of left hip, initial encounter ([place]) [S72.002A] Fall, initial encounter [W19.XXXA]  [facility] CCPD 8 hours 4 exchanges 202mL fills   End Stage Renal Disease: on peritoneal dialysis. Last treatment was Friday night. Wife and patient report no issues on peritoneal dialysis. Tolerating treatments  well at home. Plan on transitioning patient to intermittent in center hemodialysis while patient is in rehab. Rt IJ Permcath placed by Vascular on 08/27/21. - Will transition Furosemide 120mg  tab daily to non-dialysis days - Received dialysis yesterday seated in chair. Tolerated well. UF 1L removed.   -Dialysis coordinator aware of patient and outpatient needs arranged. Outpatient clinic accepted to [facility] for Monday/Friday treatments with a start date of tomorrow, 08/31/21. PD catheter will be flushed weekly during dialysis treatments. Patient will be discharged to [facility] for rehab.   Hypertension: elevated at 155/54. Home regimen of furosemide, losartan, metoprolol, amlodipine and tamsulosin. Maintain current  treatments  Anemia of chronic kidney disease: hemoglobin 8.8. normocytic. ESA as outpatient.   Secondary Hyperparathyroidism: with hyperphosphatemia on 9/9, phos of 6.3.  - Continue sevelamer with meals.     LOS: 5 Shantina Chronister 10/13/202211:41 AM

## 2021-08-30 NOTE — Progress Notes (Signed)
Peritoneal dialysis patient known at [facility] Phillip Heal), Patient is transitioning to Hemodialysis remaining at same clinic. Chair time is twice a week, Mon and Fri at 12:00pm. Please contact me with any dialysis placement concerns.  Elvera Bicker Dialysis Coordinator [phone]

## 2021-08-30 NOTE — Plan of Care (Signed)
Patient discharged per MD orders at this time.All discharge instructions,education and medications reviewed with patient and spouse at the bedside.Pt expressed understanding and will comply with dc instructions.follow up appointments was also communicated to Pt. no verbal c/o or any ssx of distress.Pt was discharged to [facility] for SNF/rehab services per order.Report was called to Ms.Paulina Fusi nurse before transport.Pt was transported by 2 EMS personnel on a stretcher.

## 2021-08-30 NOTE — Assessment & Plan Note (Addendum)
--  4 cm right upper lung opacity seen on admission CXR. Discussed w/ nephrology Dr Candiss Norse and radiology, will pursue noncontrast study as per nephrology, hopefully can be done prior to discharge. Outpatient follow-up as indicated

## 2021-08-30 NOTE — Hospital Course (Signed)
80 year old man presented after mechanical fall resulting in left hip fracture. --10/9 left hip hemiarthroplasty, foley catheter placed under anesthesia --10/10 placement dialysis catheter for temporary HD while at SNF --10/13 has chair at outpatient dialysis center.  Chart review revealed lung mass, eosinophilia  Consults Orthopedics Nephrology Urology Vascular Surgery

## 2021-08-30 NOTE — TOC Progression Note (Addendum)
Transition of Care [facility]) - Progression Note    Patient Details  Name: AH BOTT MRN: [id number] Date of Birth: 28-Jan-1941  Transition of Care [facility]) CM/SW [place], RN Phone Number: 08/30/2021, 2:41 PM  Clinical Narrative:   Patient going to room 801P bedside nurse to call report to [phone], patient and wife aware of the room number at [facility] and that he will DC after CT scan    Expected Discharge Plan: [place] Barriers to Discharge: Continued Medical Work up  Expected Discharge Plan and Services Expected Discharge Plan: [place] Choice: Resumption of Svcs/PTA Provider Living arrangements for the past 2 months: Single Family Home Expected Discharge Date: 08/30/21                           [facility] Agency: [place] Date [place]: 08/26/21   Representative spoke with at [place]: [place] ([place]) Interventions    Readmission Risk Interventions Readmission Risk Prevention Plan 08/26/2021  Transportation Screening Complete  PCP or Specialist Appt within 3-5 Days Complete  HRI or Home Care Consult Complete  Social Work Consult for [place] Planning/Counseling Complete  Palliative Care Screening Not Applicable  Medication Review [employer]) Complete  Some recent data might be hidden

## 2021-08-31 ENCOUNTER — Encounter: Payer: Self-pay | Admitting: Urology

## 2021-08-31 ENCOUNTER — Telehealth: Payer: Self-pay | Admitting: Family Medicine

## 2021-08-31 NOTE — Telephone Encounter (Signed)
CT scan reviewed  IMPRESSION: 1. 4.4 x 2.9 cm masslike consolidative opacity in the anterior right upper lobe. Neoplasm a distinct concern. PET-CT recommended to further evaluate. 2. Collapse/consolidation in the left lower lobe with 2.4 cm nodular component in the superior segment and small left upper lobe pulmonary nodules. Findings may reflect multifocal pneumonia although neoplasm not excluded. 3. Loculated posterior moderate left pleural effusion. 4. Mild mediastinal lymphadenopathy, potentially reactive. 5. Small volume fluid adjacent to the liver. 6. Aortic Atherosclerosis (ICD10-I70.0).  Results were pending at discharge last evening, given the patient's clinical stability, lack of respiratory symptoms, lack of hypoxia he was discharged in good condition to begin skilled rehabilitation.  I discussed the above CT findings with Dr. Neville Route pulmonology, we reviewed them together, no indication for rehospitalization based on clinical status.  He will see patient in clinic next week to provide consultation and further recommendations.  I discussed in detail above with wife by telephone including worrisome possibility of tumors bilaterally.  Murray Hodgkins, MD [facility]

## 2021-09-10 ENCOUNTER — Telehealth: Payer: Self-pay | Admitting: *Deleted

## 2021-09-10 NOTE — Telephone Encounter (Signed)
Message left with pt's wife to call back to discuss scheduling patient for further follow up at the cancer center. Awaiting call back.

## 2021-09-10 NOTE — Telephone Encounter (Addendum)
Spoke with pt's wife who stated that pt has an appt with Dr. Lanney Gins on 11/2 to follow up on lung mass. Pt's wife, Remo Lipps, would like to hold scheduling further follow up with Dr. B until after receiving recommendations from Dr. Lanney Gins which will allow patient time to recover and build strength from recent hospital stay. Will follow up with pt's wife next week after discussing with Dr. Lanney Gins.

## 2021-09-12 ENCOUNTER — Other Ambulatory Visit: Payer: Self-pay | Admitting: Pulmonary Disease

## 2021-09-12 DIAGNOSIS — R918 Other nonspecific abnormal finding of lung field: Secondary | ICD-10-CM

## 2021-09-17 ENCOUNTER — Ambulatory Visit: Payer: Self-pay | Admitting: Urology

## 2021-09-17 ENCOUNTER — Encounter: Payer: Self-pay | Admitting: Urology

## 2021-09-17 ENCOUNTER — Ambulatory Visit: Payer: Self-pay | Admitting: Physician Assistant

## 2021-09-18 ENCOUNTER — Telehealth: Payer: Self-pay | Admitting: Urology

## 2021-09-18 NOTE — Telephone Encounter (Signed)
Kim from [employer] called to let us know pt didn't come for appt because they were able to take foley out at facility and pt was able to use the bathroom.

## 2021-09-21 ENCOUNTER — Emergency Department: Payer: Medicare Other

## 2021-09-21 ENCOUNTER — Telehealth: Payer: Self-pay | Admitting: *Deleted

## 2021-09-21 ENCOUNTER — Encounter: Payer: Self-pay | Admitting: *Deleted

## 2021-09-21 ENCOUNTER — Emergency Department
Admission: EM | Admit: 2021-09-21 | Discharge: 2021-09-22 | Disposition: A | Discharge status: Home / Self Care | Payer: Medicare Other | Attending: Emergency Medicine | Admitting: Emergency Medicine

## 2021-09-21 DIAGNOSIS — Z794 Long term (current) use of insulin: Secondary | ICD-10-CM | POA: Insufficient documentation

## 2021-09-21 DIAGNOSIS — J45909 Unspecified asthma, uncomplicated: Secondary | ICD-10-CM | POA: Insufficient documentation

## 2021-09-21 DIAGNOSIS — Z7982 Long term (current) use of aspirin: Secondary | ICD-10-CM | POA: Diagnosis not present

## 2021-09-21 DIAGNOSIS — I132 Hypertensive heart and chronic kidney disease with heart failure and with stage 5 chronic kidney disease, or end stage renal disease: Secondary | ICD-10-CM | POA: Diagnosis not present

## 2021-09-21 DIAGNOSIS — Z79899 Other long term (current) drug therapy: Secondary | ICD-10-CM | POA: Diagnosis not present

## 2021-09-21 DIAGNOSIS — Z992 Dependence on renal dialysis: Secondary | ICD-10-CM | POA: Insufficient documentation

## 2021-09-21 DIAGNOSIS — I5032 Chronic diastolic (congestive) heart failure: Secondary | ICD-10-CM | POA: Insufficient documentation

## 2021-09-21 DIAGNOSIS — F17200 Nicotine dependence, unspecified, uncomplicated: Secondary | ICD-10-CM | POA: Diagnosis not present

## 2021-09-21 DIAGNOSIS — D649 Anemia, unspecified: Secondary | ICD-10-CM | POA: Diagnosis not present

## 2021-09-21 DIAGNOSIS — N186 End stage renal disease: Secondary | ICD-10-CM | POA: Diagnosis not present

## 2021-09-21 DIAGNOSIS — E1122 Type 2 diabetes mellitus with diabetic chronic kidney disease: Secondary | ICD-10-CM | POA: Insufficient documentation

## 2021-09-21 DIAGNOSIS — R918 Other nonspecific abnormal finding of lung field: Secondary | ICD-10-CM

## 2021-09-21 DIAGNOSIS — Z96642 Presence of left artificial hip joint: Secondary | ICD-10-CM | POA: Diagnosis not present

## 2021-09-21 DIAGNOSIS — M79605 Pain in left leg: Secondary | ICD-10-CM | POA: Diagnosis present

## 2021-09-21 DIAGNOSIS — I2510 Atherosclerotic heart disease of native coronary artery without angina pectoris: Secondary | ICD-10-CM | POA: Insufficient documentation

## 2021-09-21 LAB — CBC WITH DIFFERENTIAL/PLATELET
Abs Immature Granulocytes: 0.06 10*3/uL (ref 0.00–0.07)
Basophils Absolute: 0.1 10*3/uL (ref 0.0–0.1)
Basophils Relative: 1 %
Eosinophils Absolute: 0.7 10*3/uL — ABNORMAL HIGH (ref 0.0–0.5)
Eosinophils Relative: 7 %
HCT: 21.9 % — ABNORMAL LOW (ref 39.0–52.0)
Hemoglobin: 7 g/dL — ABNORMAL LOW (ref 13.0–17.0)
Immature Granulocytes: 1 %
Lymphocytes Relative: 6 %
Lymphs Abs: 0.6 10*3/uL — ABNORMAL LOW (ref 0.7–4.0)
MCH: 31 pg (ref 26.0–34.0)
MCHC: 32 g/dL (ref 30.0–36.0)
MCV: 96.9 fL (ref 80.0–100.0)
Monocytes Absolute: 1.1 10*3/uL — ABNORMAL HIGH (ref 0.1–1.0)
Monocytes Relative: 11 %
Neutro Abs: 7.7 10*3/uL (ref 1.7–7.7)
Neutrophils Relative %: 74 %
Platelets: 268 10*3/uL (ref 150–400)
RBC: 2.26 MIL/uL — ABNORMAL LOW (ref 4.22–5.81)
RDW: 15.5 % (ref 11.5–15.5)
WBC: 10.2 10*3/uL (ref 4.0–10.5)
nRBC: 0 % (ref 0.0–0.2)

## 2021-09-21 LAB — BASIC METABOLIC PANEL
Anion gap: 12 (ref 5–15)
BUN: 39 mg/dL — ABNORMAL HIGH (ref 8–23)
CO2: 23 mmol/L (ref 22–32)
Calcium: 8 mg/dL — ABNORMAL LOW (ref 8.9–10.3)
Chloride: 98 mmol/L (ref 98–111)
Creatinine, Ser: 4.96 mg/dL — ABNORMAL HIGH (ref 0.61–1.24)
GFR, Estimated: 11 mL/min — ABNORMAL LOW (ref >60)
Glucose, Bld: 147 mg/dL — ABNORMAL HIGH (ref 70–99)
Potassium: 4.1 mmol/L (ref 3.5–5.1)
Sodium: 133 mmol/L — ABNORMAL LOW (ref 135–145)

## 2021-09-21 MED ORDER — MORPHINE SULFATE (PF) 4 MG/ML IV SOLN
4.0000 mg | Freq: Once | INTRAVENOUS | Status: AC
  Administered 2021-09-21: 4 mg via INTRAVENOUS
  Filled 2021-09-21: qty 1

## 2021-09-21 MED ORDER — PANTOPRAZOLE SODIUM 40 MG PO TBEC: 40.0000 mg | DELAYED_RELEASE_TABLET | Freq: Two times a day (BID) | ORAL | 1 refills | Status: AC

## 2021-09-21 MED ORDER — PANTOPRAZOLE SODIUM 40 MG IV SOLR
40.0000 mg | Freq: Once | INTRAVENOUS | Status: AC
  Administered 2021-09-21: 40 mg via INTRAVENOUS
  Filled 2021-09-21: qty 40

## 2021-09-21 NOTE — ED Provider Notes (Signed)
[facility] Emergency Department Provider Note   ____________________________________________   I have reviewed the triage vital signs and the nursing notes.   HISTORY  Chief Complaint Leg Pain   History limited by: Not Limited   HPI Manuel Klein is a 80 y.o. male who presents to the emergency department today because of concerns for left leg pain.  Patient had a left hip surgery roughly 1 month ago.  Family states that the pain started shortly thereafter.  He is currently at rehab facility.  Slightly unclear why they thought he should come to the emergency department today for evaluation since this has been going on for weeks.  He does have some swelling bilateral legs although family states this is not unusual for the patient since he is on dialysis.  They state for the past couple of weeks he is also started to get weak in both of his legs.  Family states that he has been treated for a UTI recently and is finishing up a course of antibiotics for pneumonia and they feel like his pneumonia has improved.   Records reviewed. Per medical record review patient has a history of recent left hip surgery.  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   BPH (benign prostatic hyperplasia) 08/29/2021   Acute urinary retention 08/29/2021   Fall    Closed left hip fracture ([place]) 08/25/2021   Protein-calorie malnutrition, severe 05/15/2021   Hypokalemia    ESRD (end stage renal disease) (HCC)    Chronic diastolic CHF (congestive heart  failure) (HCC)    Absolute anemia 05/14/2021   Coronary artery disease    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   Symptomatic anemia 05/11/2021   Hyperlipidemia 11/03/2017   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[place]) 11/03/2017   Primary hypertension 11/03/2017    Past Surgical History:  Procedure Laterality Date   CAPD INSERTION N/A 11/07/2017   Procedure: LAPAROSCOPIC INSERTION CONTINUOUS AMBULATORY PERITONEAL DIALYSIS  (CAPD) CATHETER;  Surgeon: Katha Cabal, MD;  Location: [facility] ORS;  Service: Vascular;  Laterality: N/A;   CARDIAC SURGERY     stent placement   CATARACT EXTRACTION, BILATERAL     COLONOSCOPY     CORONARY ANGIOPLASTY     CYSTOSCOPY  08/26/2021   Procedure: CYSTOSCOPY;  Surgeon: Abbie Sons, MD;  Location: [facility] ORS;  Service: Urology;;   DIALYSIS/PERMA CATHETER INSERTION Right 08/27/2021   Procedure: DIALYSIS/PERMA CATHETER INSERTION;  Surgeon: Algernon Huxley, MD;  Location: [facility];  Service: Cardiovascular;  Laterality: Right;   ESOPHAGOGASTRODUODENOSCOPY (EGD) WITH PROPOFOL N/A 05/16/2021   Procedure: ESOPHAGOGASTRODUODENOSCOPY (EGD) WITH PROPOFOL;  Surgeon: Jonathon Bellows, MD;  Location: [facility] ENDOSCOPY;  Service: Gastroenterology;  Laterality: N/A;   GIVENS CAPSULE STUDY N/A 05/18/2021   Procedure: GIVENS CAPSULE STUDY;  Surgeon: Jonathon Bellows, MD;  Location: [facility] ENDOSCOPY;  Service: Gastroenterology;  Laterality: N/A;   HIP ARTHROPLASTY Left 08/26/2021  Procedure: ARTHROPLASTY UNIPOLAR  HIP (HEMIARTHROPLASTY);  Surgeon: Earnestine Leys, MD;  Location: [facility] ORS;  Service: Orthopedics;  Laterality: Left;   RETINAL DETACHMENT SURGERY      Prior to Admission medications   Medication Sig Start Date End Date Taking? Authorizing Provider  acetaminophen (TYLENOL) 325 MG tablet Take 2 tablets (650 mg total) by mouth every 6 (six) hours as needed for mild pain (or Fever >/= 101). 08/30/21   Samuella Cota, MD  amLODipine (NORVASC) 5 MG tablet Take 5 mg by mouth daily.    [provider]  aspirin EC 325 MG EC tablet Take 1 tablet (325 mg total) by mouth daily with breakfast. For DVT prophylaxis 08/31/21   Samuella Cota, MD  Cyanocobalamin 5000 MCG CAPS Take 5,000 Units by mouth daily.    [provider]  furosemide (LASIX) 40 MG tablet Take 3 tablets (120 mg total) by mouth every Tuesday, Thursday, and Saturday at 6 PM. 09/01/21   Samuella Cota, MD  hydrALAZINE (APRESOLINE) 25 MG tablet Take 25 mg by mouth 2 (two) times daily. 02/06/21   [provider]  HYDROcodone-acetaminophen (NORCO/VICODIN) 5-325 MG tablet Take 1-2 tablets by mouth every 6 (six) hours as needed for moderate pain or severe pain. 08/30/21   Samuella Cota, MD  insulin lispro (HUMALOG) 100 UNIT/ML KiwkPen Inject 2-4 Units into the skin 3 (three) times daily with meals.    [provider]  levothyroxine (SYNTHROID) 100 MCG tablet Take 100 mcg by mouth daily. 04/25/21   [provider]  losartan (COZAAR) 100 MG tablet Take 100 mg by mouth daily.    [provider]  magnesium oxide (MAG-OX) 400 MG tablet Take 400 mg by mouth daily.    [provider]  metoprolol tartrate (LOPRESSOR) 25 MG tablet Take 25 mg by mouth 2 (two) times daily. 04/25/21   [provider]  multivitamin (RENA-VIT) TABS tablet Take 1 tablet by mouth daily.    [provider]  pantoprazole (PROTONIX) 40 MG tablet Take 1 tablet (40 mg total) by mouth 2 (two) times daily. 05/19/21 08/27/21  Terrilee Croak, MD  rosuvastatin (CRESTOR) 10 MG tablet Take 10 mg by mouth daily.    [provider]  sevelamer carbonate (RENVELA) 800 MG tablet Take 800 mg by mouth 2 (two) times daily.    [provider]  tamsulosin (FLOMAX) 0.4 MG CAPS capsule Take 0.4 mg by mouth daily.    [provider]  VENTOLIN HFA 108 (90 Base) MCG/ACT inhaler Inhale 2 puffs into  the lungs every 4 (four) hours as needed for wheezing or shortness of breath.    [provider]    Allergies Hydralazine, Atorvastatin, Carvedilol, Ciprofloxacin, Penicillins, Sulfa antibiotics, and Sulfasalazine  Family History  Problem Relation Age of Onset   Heart disease Mother    Diabetes Mother    Diabetes Father     Social History Social History   Tobacco Use   Smoking status: Light Smoker   Smokeless tobacco: Never  Vaping Use   Vaping Use: Never used  Substance Use Topics   Alcohol use: No   Drug use: No    Review of Systems Constitutional: No fever/chills Eyes: No visual changes. ENT: No sore throat. Cardiovascular: Denies chest pain. Respiratory: Denies shortness of breath. Gastrointestinal: No abdominal pain.  No nausea, no vomiting.  No diarrhea.   Genitourinary: Negative for dysuria. Musculoskeletal: Positive for left leg pain. Positive for bilateral lower extremity swelling.  Skin:  Negative for rash. Neurological: Negative for headaches, focal weakness or numbness.  ____________________________________________   PHYSICAL EXAM:  VITAL SIGNS: ED Triage Vitals [09/21/21 2037]  Enc Vitals Group     BP 138/62     Pulse Rate (!) 58     Resp 20     Temp 97.7 F (36.5 C)     Temp Source Oral     SpO2 97 %   Constitutional: Alert and oriented.  Eyes: Conjunctivae are normal.  ENT      Head: Normocephalic and atraumatic.      Nose: No congestion/rhinnorhea.      Mouth/Throat: Mucous membranes are moist.      Neck: No stridor. Hematological/Lymphatic/Immunilogical: No cervical lymphadenopathy. Cardiovascular: Normal rate, regular rhythm.  No murmurs, rubs, or gallops.  Respiratory: Normal respiratory effort without tachypnea nor retractions. Breath sounds are clear and equal bilaterally. No wheezes/rales/rhonchi. Gastrointestinal: Soft and non tender. No rebound. No guarding.  Genitourinary: Deferred Musculoskeletal: Normal range of  motion in all extremities. Bilateral lower extremity edema. No erythema, no warmth.  Neurologic:  Normal speech and language. No gross focal neurologic deficits are appreciated.  Skin:  Skin is warm, dry and intact. No rash noted. Psychiatric: Mood and affect are normal. Speech and behavior are normal. Patient exhibits appropriate insight and judgment.  ____________________________________________    LABS (pertinent positives/negatives)  BMP na 133, k 4.1, glu 147, cr  4.96 CBC wbc 10.2, hgb 7.0, plt 268  ____________________________________________   EKG  None  ____________________________________________    RADIOLOGY  Left lower extremity US No DVT  ____________________________________________   PROCEDURES  Procedures  ____________________________________________   INITIAL IMPRESSION / ASSESSMENT AND PLAN / ED COURSE  Pertinent labs & imaging results that were available during my care of the patient were reviewed by me and considered in my medical decision making (see chart for details).   Patient presented to the emergency department today with concerns for left leg pain that has been present since surgery roughly 1 month ago.  Ultrasound was obtained to evaluate for DVT.  This was negative for DVT.  Additionally he was complaining of increased weakness.  Basic blood work was obtained which did show anemia of 7.0.  Patient was guaiac negative.  Per chart review he also had episode of anemia last summer without clear etiology.  Does sound like at that time they thought it was potentially GI bleed however GI evaluation did not reveal a source.  I did have a discussion with the patient and family.  Did offer admission for possible transfusion monitoring and medication to prevent any GI bleed if that is indeed the source.  However they opted not to be admitted.  I did encourage them to get his blood checked tomorrow.  He does have a MOST form which has checked the comfort  measures box. Will plan on discharging back to [facility].  ____________________________________________   FINAL CLINICAL IMPRESSION(S) / ED DIAGNOSES  Final diagnoses:  Left leg pain  Anemia, unspecified type     Note: This dictation was prepared with Dragon dictation. Any transcriptional errors that result from this process are unintentional     Nance Pear, MD 09/21/21 2247

## 2021-09-21 NOTE — Telephone Encounter (Signed)
error 

## 2021-09-21 NOTE — ED Triage Notes (Signed)
Pt BIBA from [facility] s/p L hip replacement. Pt with spouse who states that [facility] is requesting [place] of L leg to r/o DVT r/t increased pain, swelling & decreased mobility.  Pt is on dialysis MWF, only completed 1 hour of his run this AM r/t n/v.  Pt currently on ABX for UTI, PNA.   Noted to have 3+ pitting edema, wet cough.

## 2021-09-21 NOTE — ED Notes (Signed)
Pt presents to ER via EMS from [facility], pt sent by MD for doppler lt leg concern blood clot, pt was going to have outpatient doppler but next appt not available until next Wednesday

## 2021-09-21 NOTE — Discharge Instructions (Addendum)
As we discussed it is very important you get your blood level checked tomorrow as it was very low today at 7.0. Please seek medical attention for any high fevers, chest pain, shortness of breath, change in behavior, persistent vomiting, bloody stool or any other new or concerning symptoms.

## 2021-09-21 NOTE — Progress Notes (Signed)
Follow up phone call made to pt's wife to schedule follow up appts with Dr. Rogue Bussing. Per pt's wife, pt met with Dr. Lanney Gins this week and decided to put bronchoscopy and further follow up on hold. Pt will follow up with Dr. Lanney Gins in December and at that time will decide if wants to pursue bronchoscopy. Pt's wife states that pt has not been doing good in rehab and does not wish to schedule any follow up at the [facility] until after he mets with Dr. Loni Muse again in Dec. Instructed pt's wife to call if decides he needs to be seen sooner. Informed will follow up in Dec after pt has visit with Dr. Lanney Gins. Pt's wife verbalized understanding.

## 2021-09-24 ENCOUNTER — Other Ambulatory Visit: Payer: Self-pay

## 2021-09-24 ENCOUNTER — Emergency Department: Payer: Medicare Other

## 2021-09-24 ENCOUNTER — Emergency Department
Admission: EM | Admit: 2021-09-24 | Discharge: 2021-09-24 | Disposition: A | Discharge status: Home / Self Care | Payer: Medicare Other | Attending: Student in an Organized Health Care Education/Training Program | Admitting: Student in an Organized Health Care Education/Training Program

## 2021-09-24 DIAGNOSIS — J45909 Unspecified asthma, uncomplicated: Secondary | ICD-10-CM | POA: Diagnosis not present

## 2021-09-24 DIAGNOSIS — N184 Chronic kidney disease, stage 4 (severe): Secondary | ICD-10-CM | POA: Diagnosis not present

## 2021-09-24 DIAGNOSIS — I2510 Atherosclerotic heart disease of native coronary artery without angina pectoris: Secondary | ICD-10-CM | POA: Insufficient documentation

## 2021-09-24 DIAGNOSIS — Z20822 Contact with and (suspected) exposure to covid-19: Secondary | ICD-10-CM | POA: Diagnosis not present

## 2021-09-24 DIAGNOSIS — Z794 Long term (current) use of insulin: Secondary | ICD-10-CM | POA: Insufficient documentation

## 2021-09-24 DIAGNOSIS — E1122 Type 2 diabetes mellitus with diabetic chronic kidney disease: Secondary | ICD-10-CM | POA: Diagnosis not present

## 2021-09-24 DIAGNOSIS — Z7982 Long term (current) use of aspirin: Secondary | ICD-10-CM | POA: Diagnosis not present

## 2021-09-24 DIAGNOSIS — I509 Heart failure, unspecified: Secondary | ICD-10-CM | POA: Diagnosis not present

## 2021-09-24 DIAGNOSIS — Z7984 Long term (current) use of oral hypoglycemic drugs: Secondary | ICD-10-CM | POA: Insufficient documentation

## 2021-09-24 DIAGNOSIS — I130 Hypertensive heart and chronic kidney disease with heart failure and stage 1 through stage 4 chronic kidney disease, or unspecified chronic kidney disease: Secondary | ICD-10-CM | POA: Insufficient documentation

## 2021-09-24 DIAGNOSIS — D649 Anemia, unspecified: Secondary | ICD-10-CM | POA: Diagnosis not present

## 2021-09-24 DIAGNOSIS — F17210 Nicotine dependence, cigarettes, uncomplicated: Secondary | ICD-10-CM | POA: Diagnosis not present

## 2021-09-24 DIAGNOSIS — R5383 Other fatigue: Secondary | ICD-10-CM | POA: Diagnosis present

## 2021-09-24 LAB — CBC WITH DIFFERENTIAL/PLATELET
Abs Immature Granulocytes: 0.08 10*3/uL — ABNORMAL HIGH (ref 0.00–0.07)
Basophils Absolute: 0.1 10*3/uL (ref 0.0–0.1)
Basophils Relative: 1 %
Eosinophils Absolute: 0.7 10*3/uL — ABNORMAL HIGH (ref 0.0–0.5)
Eosinophils Relative: 8 %
HCT: 21.9 % — ABNORMAL LOW (ref 39.0–52.0)
Hemoglobin: 6.9 g/dL — ABNORMAL LOW (ref 13.0–17.0)
Immature Granulocytes: 1 %
Lymphocytes Relative: 7 %
Lymphs Abs: 0.6 10*3/uL — ABNORMAL LOW (ref 0.7–4.0)
MCH: 30.7 pg (ref 26.0–34.0)
MCHC: 31.5 g/dL (ref 30.0–36.0)
MCV: 97.3 fL (ref 80.0–100.0)
Monocytes Absolute: 1 10*3/uL (ref 0.1–1.0)
Monocytes Relative: 10 %
Neutro Abs: 6.8 10*3/uL (ref 1.7–7.7)
Neutrophils Relative %: 73 %
Platelets: 270 10*3/uL (ref 150–400)
RBC: 2.25 MIL/uL — ABNORMAL LOW (ref 4.22–5.81)
RDW: 15.5 % (ref 11.5–15.5)
WBC: 9.4 10*3/uL (ref 4.0–10.5)
nRBC: 0 % (ref 0.0–0.2)

## 2021-09-24 LAB — COMPREHENSIVE METABOLIC PANEL
ALT: 9 U/L (ref 0–44)
AST: 14 U/L — ABNORMAL LOW (ref 15–41)
Albumin: 2.4 g/dL — ABNORMAL LOW (ref 3.5–5.0)
Alkaline Phosphatase: 79 U/L (ref 38–126)
Anion gap: 12 (ref 5–15)
BUN: 57 mg/dL — ABNORMAL HIGH (ref 8–23)
CO2: 22 mmol/L (ref 22–32)
Calcium: 8.2 mg/dL — ABNORMAL LOW (ref 8.9–10.3)
Chloride: 99 mmol/L (ref 98–111)
Creatinine, Ser: 7.55 mg/dL — ABNORMAL HIGH (ref 0.61–1.24)
GFR, Estimated: 7 mL/min — ABNORMAL LOW (ref >60)
Glucose, Bld: 111 mg/dL — ABNORMAL HIGH (ref 70–99)
Potassium: 4.5 mmol/L (ref 3.5–5.1)
Sodium: 133 mmol/L — ABNORMAL LOW (ref 135–145)
Total Bilirubin: 0.9 mg/dL (ref 0.3–1.2)
Total Protein: 5.8 g/dL — ABNORMAL LOW (ref 6.5–8.1)

## 2021-09-24 LAB — PREPARE RBC (CROSSMATCH)

## 2021-09-24 LAB — RESP PANEL BY RT-PCR (FLU A&B, COVID) ARPGX2
Influenza A by PCR: NEGATIVE
Influenza B by PCR: NEGATIVE
SARS Coronavirus 2 by RT PCR: NEGATIVE

## 2021-09-24 MED ORDER — HYDROCODONE-ACETAMINOPHEN 5-325 MG PO TABS
1.0000 | ORAL_TABLET | Freq: Once | ORAL | Status: AC
  Administered 2021-09-24: 1 via ORAL
  Filled 2021-09-24: qty 1

## 2021-09-24 MED ORDER — SODIUM CHLORIDE 0.9 % IV SOLN
10.0000 mL/h | Freq: Once | INTRAVENOUS | Status: AC
  Administered 2021-09-24: 10 mL/h via INTRAVENOUS

## 2021-09-24 NOTE — ED Provider Notes (Signed)
[facility] Emergency Department Provider Note    Event Date/Time   First MD Initiated Contact with Patient 09/24/21 1549     (approximate)  I have reviewed the triage vital signs and the nursing notes.   HISTORY  Chief Complaint Weakness and Abnormal Lab    HPI Manuel Klein is a 80 y.o. male with an extensive below listed past medical history presents to the ER for generalized malaise and fatigue.  Reportedly having downtrending hemoglobins.  Has been extensively worked up for source of bleeding is all been unremarkable.  He is not having hematuria no hematochezia no melena no injuries.  No recent nosebleeds.  He is on supplemental iron.    Past Medical History:  Diagnosis Date   Asthma    CHF (congestive heart failure) (HCC)    Chronic kidney disease    stage IV   Coronary artery disease    Diabetes mellitus without complication (HCC)    GERD (gastroesophageal reflux disease)    Hyperlipidemia    Hypertension    Myocardial infarction ([place])    Seizures ([place])    none since 20"s   Family History  Problem Relation Age of Onset   Heart disease Mother    Diabetes Mother    Diabetes Father    Past Surgical History:  Procedure Laterality Date   CAPD INSERTION N/A 11/07/2017   Procedure: LAPAROSCOPIC INSERTION CONTINUOUS AMBULATORY PERITONEAL DIALYSIS  (CAPD) CATHETER;  Surgeon: Katha Cabal, MD;  Location: [facility] ORS;  Service: Vascular;  Laterality: N/A;   CARDIAC SURGERY     stent placement   CATARACT EXTRACTION, BILATERAL     COLONOSCOPY     CORONARY ANGIOPLASTY     CYSTOSCOPY  08/26/2021   Procedure: CYSTOSCOPY;  Surgeon: Abbie Sons, MD;  Location: [facility] ORS;  Service: Urology;;   DIALYSIS/PERMA CATHETER INSERTION Right 08/27/2021   Procedure: DIALYSIS/PERMA CATHETER INSERTION;  Surgeon: Algernon Huxley, MD;  Location: [facility];  Service: Cardiovascular;  Laterality: Right;   ESOPHAGOGASTRODUODENOSCOPY (EGD) WITH  PROPOFOL N/A 05/16/2021   Procedure: ESOPHAGOGASTRODUODENOSCOPY (EGD) WITH PROPOFOL;  Surgeon: Jonathon Bellows, MD;  Location: [facility] ENDOSCOPY;  Service: Gastroenterology;  Laterality: N/A;   GIVENS CAPSULE STUDY N/A 05/18/2021   Procedure: GIVENS CAPSULE STUDY;  Surgeon: Jonathon Bellows, MD;  Location: [facility] ENDOSCOPY;  Service: Gastroenterology;  Laterality: N/A;   HIP ARTHROPLASTY Left 08/26/2021   Procedure: ARTHROPLASTY UNIPOLAR  HIP (HEMIARTHROPLASTY);  Surgeon: Earnestine Leys, MD;  Location: [facility] ORS;  Service: Orthopedics;  Laterality: Left;   RETINAL DETACHMENT SURGERY     Patient Active Problem List   Diagnosis Date Noted   Lung mass 08/30/2021   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/30/2021   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   BPH (benign prostatic hyperplasia) 08/29/2021   Acute urinary retention 08/29/2021   Fall    Closed left hip fracture ([place]) 08/25/2021   Protein-calorie malnutrition, severe 05/15/2021   Hypokalemia    ESRD (end stage renal disease) (HCC)    Chronic diastolic CHF (congestive heart failure) (HCC)    Absolute anemia 05/14/2021   Coronary artery disease    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 Authorizing Provider  amLODipine (NORVASC) 10 MG tablet Take 10 mg  by mouth daily.   Yes [provider]  aspirin EC 325 MG EC tablet Take 1 tablet (325 mg total) by mouth daily with breakfast. For DVT prophylaxis 08/31/21  Yes Samuella Cota, MD  calcium carbonate (OS-CAL - DOSED IN MG OF ELEMENTAL CALCIUM) 1250 (500 Ca) MG tablet Take 1 tablet by mouth 2 (two) times daily with a meal.   Yes [provider]  cefpodoxime (VANTIN) 100 MG tablet Take 100 mg by mouth daily.   Yes [provider]   Cyanocobalamin 5000 MCG CAPS Take 5,000 Units by mouth daily.   Yes [provider]  doxycycline (MONODOX) 100 MG capsule Take 100 mg by mouth 2 (two) times daily.   Yes [provider]  guaiFENesin (MUCINEX) 600 MG 12 hr tablet Take 1,200 mg by mouth every 12 (twelve) hours.   Yes [provider]  hydrALAZINE (APRESOLINE) 25 MG tablet Take 25 mg by mouth 2 (two) times daily. 02/06/21  Yes [provider]  HYDROcodone-acetaminophen (NORCO/VICODIN) 5-325 MG tablet Take 1-2 tablets by mouth every 6 (six) hours as needed for moderate pain or severe pain. 08/30/21  Yes Samuella Cota, MD  insulin lispro (HUMALOG) 100 UNIT/ML KiwkPen Inject 2-4 Units into the skin 3 (three) times daily with meals.   Yes [provider]  lactulose (CHRONULAC) 10 GM/15ML solution Take 20 g by mouth 2 (two) times daily as needed for mild constipation.   Yes [provider]  levothyroxine (SYNTHROID) 100 MCG tablet Take 100 mcg by mouth daily. 04/25/21  Yes [provider]  losartan (COZAAR) 100 MG tablet Take 100 mg by mouth daily.   Yes [provider]  magnesium oxide (MAG-OX) 400 MG tablet Take 400 mg by mouth daily.   Yes [provider]  metoprolol tartrate (LOPRESSOR) 25 MG tablet Take 25 mg by mouth 2 (two) times daily. 04/25/21  Yes [provider]  multivitamin (RENA-VIT) TABS tablet Take 1 tablet by mouth daily.   Yes [provider]  rosuvastatin (CRESTOR) 10 MG tablet Take 10 mg by mouth daily.   Yes [provider]  sevelamer carbonate (RENVELA) 800 MG tablet Take 800 mg by mouth 2 (two) times daily.   Yes [provider]  tamsulosin (FLOMAX) 0.4 MG CAPS capsule Take 0.4 mg by mouth daily.   Yes [provider]  acetaminophen (TYLENOL) 325 MG tablet Take 2 tablets (650 mg total) by mouth every 6 (six) hours as needed for mild pain (or Fever >/= 101). 08/30/21   Samuella Cota, MD   furosemide (LASIX) 40 MG tablet Take 3 tablets (120 mg total) by mouth every Tuesday, Thursday, and Saturday at 6 PM. 09/01/21   Samuella Cota, MD  pantoprazole (PROTONIX) 40 MG tablet Take 1 tablet (40 mg total) by mouth 2 (two) times daily. 05/19/21 08/27/21  Terrilee Croak, MD  pantoprazole (PROTONIX) 40 MG tablet Take 1 tablet (40 mg total) by mouth 2 (two) times daily. 09/21/21 09/21/22  Nance Pear, MD  VENTOLIN HFA [phone] Base) MCG/ACT inhaler Inhale 2 puffs into the lungs every 4 (four) hours as needed for wheezing or shortness of breath.    [provider]    Allergies Hydralazine, Atorvastatin, Carvedilol, Ciprofloxacin, Penicillins, Sulfa antibiotics, and Sulfasalazine    Social History Social History   Tobacco Use   Smoking status: Light Smoker   Smokeless tobacco: Never  Vaping Use   Vaping Use: Never used  Substance Use Topics  Alcohol use: No   Drug use: No    Review of Systems Patient denies headaches, rhinorrhea, blurry vision, numbness, shortness of breath, chest pain, edema, cough, abdominal pain, nausea, vomiting, diarrhea, dysuria, fevers, rashes or hallucinations unless otherwise stated above in HPI. ____________________________________________   PHYSICAL EXAM:  VITAL SIGNS: Vitals:   09/24/21 1900 09/24/21 1930  BP: (!) 162/68 (!) 172/69  Pulse: 64 61  Resp: 17 16  Temp:    SpO2: 97% 97%    Constitutional: Alert and oriented. Eyes: Conjunctivae are normal.  Head: Atraumatic. Nose: No congestion/rhinnorhea. Mouth/Throat: Mucous membranes are moist.   Neck: No stridor. Painless ROM.  Cardiovascular: Normal rate, regular rhythm. Grossly normal heart sounds.  Good peripheral circulation. Respiratory: Normal respiratory effort.  No retractions. Lungs CTAB. Gastrointestinal: Soft and nontender. No distention. No abdominal bruits. No CVA tenderness. Genitourinary:  Musculoskeletal: No lower extremity tenderness Neurologic:  Normal  speech and language. No gross focal neurologic deficits are appreciated. No facial droop Skin:  Skin is warm, dry and intact. No rash noted. Psychiatric: Mood and affect are normal. Speech and behavior are normal.  ____________________________________________   LABS (all labs ordered are listed, but only abnormal results are displayed)  Results for orders placed or performed during the hospital encounter of 09/24/21 (from the past 24 hour(s))  CBC with Differential     Status: Abnormal   Collection Time: 09/24/21 12:45 PM  Result Value Ref Range   WBC 9.4 4.0 - 10.5 K/uL   RBC 2.25 (L) 4.22 - 5.81 MIL/uL   Hemoglobin 6.9 (L) 13.0 - 17.0 g/dL   HCT 21.9 (L) 39.0 - 52.0 %   MCV 97.3 80.0 - 100.0 fL   MCH 30.7 26.0 - 34.0 pg   MCHC 31.5 30.0 - 36.0 g/dL   RDW 15.5 11.5 - 15.5 %   Platelets 270 150 - 400 K/uL   nRBC 0.0 0.0 - 0.2 %   Neutrophils Relative % 73 %   Neutro Abs 6.8 1.7 - 7.7 K/uL   Lymphocytes Relative 7 %   Lymphs Abs 0.6 (L) 0.7 - 4.0 K/uL   Monocytes Relative 10 %   Monocytes Absolute 1.0 0.1 - 1.0 K/uL   Eosinophils Relative 8 %   Eosinophils Absolute 0.7 (H) 0.0 - 0.5 K/uL   Basophils Relative 1 %   Basophils Absolute 0.1 0.0 - 0.1 K/uL   Immature Granulocytes 1 %   Abs Immature Granulocytes 0.08 (H) 0.00 - 0.07 K/uL  Comprehensive metabolic panel     Status: Abnormal   Collection Time: 09/24/21 12:45 PM  Result Value Ref Range   Sodium 133 (L) 135 - 145 mmol/L   Potassium 4.5 3.5 - 5.1 mmol/L   Chloride 99 98 - 111 mmol/L   CO2 22 22 - 32 mmol/L   Glucose, Bld 111 (H) 70 - 99 mg/dL   BUN 57 (H) 8 - 23 mg/dL   Creatinine, Ser 7.55 (H) 0.61 - 1.24 mg/dL   Calcium 8.2 (L) 8.9 - 10.3 mg/dL   Total Protein 5.8 (L) 6.5 - 8.1 g/dL   Albumin 2.4 (L) 3.5 - 5.0 g/dL   AST 14 (L) 15 - 41 U/L   ALT 9 0 - 44 U/L   Alkaline Phosphatase 79 38 - 126 U/L   Total Bilirubin 0.9 0.3 - 1.2 mg/dL   GFR, Estimated 7 (L) >60 mL/min   Anion gap 12 5 - 15  Type and screen  Love  Status: None (Preliminary result)   Collection Time: 09/24/21 12:45 PM  Result Value Ref Range   ABO/RH(D) A POS    Antibody Screen NEG    Sample Expiration 09/27/2021,2359    Unit Number [id number]    Blood Component Type RED CELLS,LR    Unit division 00    Status of Unit ISSUED    Transfusion Status OK TO TRANSFUSE    Crossmatch Result      Compatible Performed at [facility], [address], [place], [place] [address]   Prepare RBC (crossmatch)     Status: None   Collection Time: 09/24/21  4:43 PM  Result Value Ref Range   Order Confirmation      ORDER PROCESSED BY BLOOD BANK Performed at [facility], [address] [place], [place] [address]   Resp Panel by RT-PCR (Flu A&B, Covid) Nasopharyngeal Swab     Status: None   Collection Time: 09/24/21  6:44 PM   Specimen: Nasopharyngeal Swab; Nasopharyngeal(NP) swabs in vial transport medium  Result Value Ref Range   SARS Coronavirus 2 by RT PCR NEGATIVE NEGATIVE   Influenza A by PCR NEGATIVE NEGATIVE   Influenza B by PCR NEGATIVE NEGATIVE   ____________________________________________  EKG____________________________________________  RADIOLOGY   ____________________________________________   PROCEDURES  Procedure(s) performed:  .Critical Care Performed by: Merlyn Lot, MD Authorized by: Merlyn Lot, MD   Critical care provider statement:    Critical care time (minutes):  10   Critical care was necessary to treat or prevent imminent or life-threatening deterioration of the following conditions:  Circulatory failure and renal failure   Critical care was time spent personally by me on the following activities:  Ordering and performing treatments and interventions, ordering and review of laboratory studies, ordering and review of radiographic studies, pulse oximetry, re-evaluation of patient's condition, review of old charts, obtaining history from  patient or surrogate, examination of patient, evaluation of patient's response to treatment, discussions with primary provider, discussions with consultants and development of treatment plan with patient or surrogate    Critical Care performed: yes ____________________________________________   INITIAL IMPRESSION / [place] / ED COURSE  Pertinent labs & imaging results that were available during my care of the patient were reviewed by me and considered in my medical decision making (see chart for details).   DDX: Anemia, anemia of chronic disease, electrolyte abnormality, dehydration  Manuel Klein is a 80 y.o. who presents to the ED with presentation as described above.  Seems most consistent with symptomatic anemia in the setting of patient with extensive chronic kidney disease.  Unlikely GI source given extensive work-up he is not having any melena or hematochezia.  Given his frailty I do think that it would be appropriate to go ahead and transfuse while here in the ER.  Patient denies any other complaints.  Will reassess and see how he is feeling after the transfusion.  Clinical Course as of 09/24/21 2045  Mon Sep 24, 2021  1715 Chest x-ray showing infiltrates consistent with recent pneumonia which she was treated for is not complaining of any worsening shortness of breath or cough no white count no fever.  Seems like he has had a slow downtrending of his hemoglobin and now more symptomatic.  He has had extensive work-up for GI source of bleeding seems to be more likely secondary to anemia of chronic disease and renal insufficiency will transfuse 1 unit and reassess. [PR]  2026 Patient feels significantly improved after 1  unit transfusion.  As he is not having signs of active bleeding this seems to be more of a acute on chronic issue for him I do believe he is appropriate for outpatient follow-up.  Family agreeable to plan. [PR]    Clinical Course User Index [PR] Merlyn Lot, MD    The patient was evaluated in Emergency Department today for the symptoms described in the history of present illness. He/she was evaluated in the context of the global COVID-19 pandemic, which necessitated consideration that the patient might be at risk for infection with the SARS-CoV-2 virus that causes COVID-19. Institutional protocols and algorithms that pertain to the evaluation of patients at risk for COVID-19 are in a state of rapid change based on information released by regulatory bodies including the CDC and federal and state organizations. These policies and algorithms were followed during the patient's care in the ED.  As part of my medical decision making, I reviewed the following data within the [facility] notes reviewed and incorporated, Labs reviewed, notes from prior ED visits and [place] Controlled Substance Database   ____________________________________________   FINAL CLINICAL IMPRESSION(S) / ED DIAGNOSES  Final diagnoses:  Symptomatic anemia      NEW MEDICATIONS STARTED DURING THIS VISIT:  New Prescriptions   No medications on file     Note:  This document was prepared using Dragon voice recognition software and may include unintentional dictation errors.    Merlyn Lot, MD 09/24/21 2045

## 2021-09-24 NOTE — ED Triage Notes (Signed)
Pt presented to ED from [facility] with wife with c/o of abnormal labs and states pt was here this past Friday and pt left AMA.  Pt is a dialysis pt. (R side permacath) Pt states HX of low Hgb and states no answerers or reason. Wife and pt denies any current blood loss. Endo and colonoscopy done in June with no results. Pt is A&Ox4.

## 2021-09-24 NOTE — ED Notes (Signed)
Called EMS for transport back to [facility]

## 2021-09-25 ENCOUNTER — Inpatient Hospital Stay
Admission: RE | Admit: 2021-09-25 | Discharge: 2021-09-25 | Disposition: A | Discharge status: Home / Self Care | Payer: Medicare Other | Source: Ambulatory Visit

## 2021-09-25 LAB — BPAM RBC
Blood Product Expiration Date: 202212032359
ISSUE DATE / TIME: 202211071724
Unit Type and Rh: 6200

## 2021-09-25 LAB — TYPE AND SCREEN
ABO/RH(D): A POS
Antibody Screen: NEGATIVE
Unit division: 0

## 2021-09-25 NOTE — Progress Notes (Signed)
Call to patient to do pre-op interview. Patient's wife Hoyle Sauer stated that they had decided to not have the bronchoscopy procedure. Stated that Dr. Teodoro Kil office was made aware of this decision last week. Attempted to call Dr. Lanney Gins office to inform of this since patient is still on the OR schedule. [facility] phones are not working at this time, unable to leave message.

## 2021-09-27 ENCOUNTER — Non-Acute Institutional Stay: Payer: Medicare Other | Admitting: Primary Care

## 2021-09-27 ENCOUNTER — Ambulatory Visit: Payer: Medicare Other

## 2021-09-27 ENCOUNTER — Other Ambulatory Visit: Payer: Self-pay

## 2021-09-28 ENCOUNTER — Other Ambulatory Visit: Payer: Self-pay

## 2021-09-28 ENCOUNTER — Non-Acute Institutional Stay: Payer: Medicare Other | Admitting: Primary Care

## 2021-09-28 DIAGNOSIS — Z515 Encounter for palliative care: Secondary | ICD-10-CM

## 2021-09-28 DIAGNOSIS — Z7189 Other specified counseling: Secondary | ICD-10-CM | POA: Insufficient documentation

## 2021-09-28 NOTE — Progress Notes (Signed)
[facility] Palliative Care Consult Note Telephone: [phone]  Fax: [phone]      Due to the COVID-19 crisis, this visit was done via telemedicine from my office and it was initiated and consent by this patient and or family.  I connected with  [name] OR PROXY on 09/28/21 by a  telemedicine application and verified that I am speaking with the correct person using two identifiers.   I discussed the limitations of evaluation and management by telemedicine. The patient /proxy expressed understanding and agreed to proceed.  Date of encounter: 09/28/21 PATIENT NAME: Manuel Klein [place] [id number]   [phone] (home)  DOB: May 14, 1941 MRN: [id number] PRIMARY CARE PROVIDER:    Lynnell Jude, MD,  [place] [address] [phone]  REFERRING PROVIDER:   Cephas Darby, [address] [place],   [address] [phone]  RESPONSIBLE PARTY:    Contact Information     Name Relation Home Work Mobile   [place],Carolyn "Remo Lipps" [place] [phone]  [phone]   Danise Edge Daughter [phone]          Palliative Care was asked to follow this patient by consultation request of  Cephas Darby, FNP to address advance care planning. This is the initial visit.                                    ASSESSMENT AND PLAN / RECOMMENDATIONS:   Advance Care Planning/Goals of Care: Goals include to maximize quality of life and symptom management. Our advance care planning conversation included a discussion about:    The value and importance of advance care planning  Experiences with loved ones who have been seriously ill or have died  Exploration of personal, cultural or spiritual beliefs that might influence medical decisions  Exploration of goals of care in the event of a sudden injury or illness  Identification of a healthcare agent - wife Review and updating or creation of an  advance directive  document . Decision not to resuscitate or to de-escalate disease focused treatments due to poor prognosis. CODE STATUS: DNR Discussed with wife disease course and choices to decelerate care interventions. Discussed pt wanting to be at home for EOL. Discussed desire to do therapy but also his continuing decline. Discussed supportive services of hospice at home.  I reviewed a MOST form today. The patient and family outlined their wishes for the following treatment decisions:  Cardiopulmonary Resuscitation: Do Not Attempt Resuscitation (DNR/No CPR)  Medical Interventions: Comfort Measures: Keep clean, warm, and dry. Use medication by any route, positioning, wound care, and other measures to relieve pain and suffering. Use oxygen, suction and manual treatment of airway obstruction as needed for comfort. Do not transfer to the hospital unless comfort needs cannot be met in current location.  Antibiotics: Antibiotics if indicated  IV Fluids: IV fluids if indicated  Feeding Tube: No feeding tube    Follow up Palliative Care Visit: Palliative care will continue to follow for complex medical decision making, advance care planning, and clarification of goals. Return 1 week or prn.  I spent 35 minutes providing this consultation. More than 50% of the time in this consultation was spent in counseling and care coordination.  Thank you for the opportunity to participate in the care of Mr. Kozma.  The palliative care team will continue to follow. Please call our office  at [phone] if we can be of additional assistance.   Jason Coop, NP , DNP, MPH, AGPCNP-BC, ACHPN  COVID-19 PATIENT SCREENING TOOL Asked and negative response unless otherwise noted:   Have you had symptoms of covid, tested positive or been in contact with someone with symptoms/positive test in the past 5-10 days?

## 2021-10-02 ENCOUNTER — Non-Acute Institutional Stay: Payer: Medicare Other | Admitting: Primary Care

## 2021-10-02 ENCOUNTER — Other Ambulatory Visit: Payer: Self-pay

## 2021-10-02 DIAGNOSIS — D631 Anemia in chronic kidney disease: Secondary | ICD-10-CM

## 2021-10-02 DIAGNOSIS — N186 End stage renal disease: Secondary | ICD-10-CM

## 2021-10-02 DIAGNOSIS — E43 Unspecified severe protein-calorie malnutrition: Secondary | ICD-10-CM

## 2021-10-02 DIAGNOSIS — Z515 Encounter for palliative care: Secondary | ICD-10-CM

## 2021-10-02 DIAGNOSIS — I5032 Chronic diastolic (congestive) heart failure: Secondary | ICD-10-CM

## 2021-10-02 DIAGNOSIS — S72002S Fracture of unspecified part of neck of left femur, sequela: Secondary | ICD-10-CM

## 2021-10-02 NOTE — Progress Notes (Signed)
[facility] Consult Note Telephone: [phone]  Fax: [phone]    Date of encounter: 10/02/21 9:58 AM PATIENT NAME: Manuel Klein [id number]   [phone] (home)  DOB: 1941/07/26 MRN: [id number] PRIMARY CARE PROVIDER:    Lynnell Jude, MD,  [place] [address] [phone]  REFERRING PROVIDER:   Cephas Darby, FNP Cephas Darby, [address] [place],  [place] [address] [phone]   RESPONSIBLE PARTY:    Contact Information     Name Relation Home Work Mobile   Wynot "Remo Lipps" Spouse [phone]  [phone]   Danise Edge Daughter [phone]         I met face to face with patient and family in [facility]. Palliative Care was asked to follow this patient by consultation request of  Cephas Darby, FNP  to address advance care planning and complex medical decision making. This is a follow up visit.                                   ASSESSMENT AND PLAN / RECOMMENDATIONS:   Advance Care Planning/Goals of Care: Goals include to maximize quality of life and symptom management. Our advance care planning conversation included a discussion about:    The value and importance of advance care planning  Experiences with loved ones who have been seriously ill or have died  Exploration of personal, cultural or spiritual beliefs that might influence medical decisions  Exploration of goals of care in the event of a sudden injury or illness  Review of an  advance directive document .  CODE STATUS: DNR I completed a MOST form today. The patient and family outlined their wishes for the following treatment decisions:  Cardiopulmonary Resuscitation: Do Not Attempt Resuscitation (DNR/No CPR)  Medical Interventions: Comfort Measures: Keep clean, warm, and dry. Use medication by any route, positioning, wound care, and other measures to relieve pain and suffering.  Use oxygen, suction and manual treatment of airway obstruction as needed for comfort. Do not transfer to the hospital unless comfort needs cannot be met in current location.  Antibiotics: Antibiotics if indicated  IV Fluids: IV fluids if indicated  Feeding Tube: No feeding tube   I spent 20 minutes providing this consultation. More than 50% of the time in this consultation was spent in counseling and care coordination.  --------------------------------------------------------------------------------------------------------------------- Symptom Management/Plan:  Patient and wife outline goal is to go home. Patient states this over and over. We outlined care at home and caregiving needs. They will need to build a ramp which she has a contact for and also hire in-home caregivers. She also has a contact for this.   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 I've asked him to discuss with the nephrologist that he usually sees for a plan to remove the hemodialysis catheter. They state that this has been recommended to remain for a little while to be sure the conversion to peritoneal is successful.  Creatinine 7.55.  Lung Mass: Reviewed scan reports. Patient has maintained he is not interested in diagnostics or treatments due to frailty, and wife is in agreement.  Intake patient endorses anorexia poor intake. He currently has 3 to 4+ edema in his lower extremities so weights are somewhat confounded. He also states that he has some anorexia from peritoneal catheter being placed some years ago. Albumin= 2.4,  calorie malnutrition.  Mobility I discussed with therapy department his mobility is likely at a maximum or approaching maximum. .  We discussed mobility accommodations such as getting a ramp, lift chair and being able  to pivot transfer. Discussed telemed for doctor visits as his mobility is limited.  Continuity of Care: I will let snf social worker know that they would like to move toward  discharge. They would like home health with [facility] who is currently involved in their care and I would recommend maximum assistance when getting  home such as PT,OT. Speech,   nurses and CNA. They will need DME as well.  I am happy to continue to follow with request of primary provider. Family would like continued palliative services. We talked about hospice. Currently he is seeking dialysis treatments and wants to continue those , wants to get home and assess improvement.  Follow up Palliative Care Visit: Palliative care will continue to follow for complex medical decision making, advance care planning, and clarification of goals. Return 1-2 weeks or prn.  This visit was coded based on medical decision making (MDM).  PPS: 30%  HOSPICE ELIGIBILITY/DIAGNOSIS: yes with concordant goals of care/ protein calorie malnutrition.  Chief Complaint: debility  HISTORY OF PRESENT ILLNESS:  Manuel Klein is a 80 y.o. year old male  with ESRd, CHF, fx hip, sequelae, debility.In snf for rehab but is eager to return home. Wife understands he is approaching maximum recovery and they wish to go home for home health and supportive care when indicated. Wants to continue with home PD vs HD.  History obtained from review of EMR, discussion with primary team, and interview with family, facility staff/caregiver and/or [name].  I reviewed available labs, medications, imaging, studies and related documents from the EMR.  Records reviewed and summarized above.   ROS  General: NAD EYES: endorses vision changes ENMT: denies dysphagia Cardiovascular: denies chest pain, endorses DOE Pulmonary: denies cough, denies increased SOB Abdomen: endorses poor  appetite, denies constipation, endorses continence of bowel GU: denies dysuria, endorses continence of urine MSK:  endorses weakness,  no falls reported since hip fx but remains risk Skin: denies rashes or wounds Neurological: endorses L thigh  pain, denies  insomnia Psych: Endorses flat mood Heme/lymph/immuno: denies bruises, abnormal bleeding  Physical Exam: Current and past weights: 151 lbs, bmi 22.2 Constitutional: ill appearing General: frail appearing, thin EYES: anicteric sclera, lids intact, no discharge  ENMT: intact hearing, oral mucous membranes moist, dentition intact CV: S1S2, RRR, 3+ bil LE edema Pulmonary: lungs with rhonchi throughout,  slight increased work of breathing, no cough, room air Abdomen: intake 50%, no ascites GU: deferred MSK: + sarcopenia, moves all extremities, ambulatory with walker and help Skin: warm and dry, no rashes or wounds on visible skin Neuro:  severe generalized weakness,   no cognitive impairment, fatigued Psych: non-anxious affect, A and O x 3 Hem/lymph/immuno: no widespread bruising  Thank you for the opportunity to participate in the care of Mr. Gingras.  The palliative care team will continue to follow. Please call our office at [phone] if we can be of additional assistance.   Jason Coop, NP DNP, AGPCNP-BC  COVID-19 PATIENT SCREENING TOOL Asked and negative response unless otherwise noted:   Have you had symptoms of covid, tested positive or been in contact with someone with symptoms/positive test in the past 5-10 days?

## 2021-10-03 ENCOUNTER — Other Ambulatory Visit: Payer: Medicare Other

## 2021-10-04 ENCOUNTER — Ambulatory Visit: Payer: Medicare Other | Admitting: Internal Medicine

## 2021-10-05 ENCOUNTER — Ambulatory Visit: Admit: 2021-10-05 | Payer: Medicare Other

## 2021-10-05 SURGERY — BRONCHOSCOPY, WITH EBUS
Anesthesia: General

## 2021-10-10 ENCOUNTER — Other Ambulatory Visit: Payer: Medicare Other

## 2021-10-15 ENCOUNTER — Ambulatory Visit: Payer: Medicare Other | Admitting: Internal Medicine

## 2021-10-19 ENCOUNTER — Telehealth: Payer: Self-pay | Admitting: Primary Care

## 2021-10-19 NOTE — Telephone Encounter (Signed)
Spoke with patient's wife Remo Lipps, and have scheduled an In-home Palliative visit (post discharge from [facility]), this was scheduled for 10/22/21 @ 2:30 PM

## 2021-10-22 ENCOUNTER — Other Ambulatory Visit: Payer: Self-pay

## 2021-10-22 ENCOUNTER — Other Ambulatory Visit: Payer: Medicare Other | Admitting: Primary Care

## 2021-10-22 DIAGNOSIS — S72002S Fracture of unspecified part of neck of left femur, sequela: Secondary | ICD-10-CM

## 2021-10-22 DIAGNOSIS — E43 Unspecified severe protein-calorie malnutrition: Secondary | ICD-10-CM

## 2021-10-22 DIAGNOSIS — Z515 Encounter for palliative care: Secondary | ICD-10-CM

## 2021-10-22 DIAGNOSIS — N186 End stage renal disease: Secondary | ICD-10-CM

## 2021-10-22 NOTE — Progress Notes (Signed)
[facility] Consult Note Telephone: [phone]  Fax: [phone]    Date of encounter: 10/22/21 3:34 PM PATIENT NAME: Manuel Klein [address] [place] [address]   [phone] (home)  DOB: 02/27/1941 MRN: [id number] PRIMARY CARE PROVIDER:    Lynnell Jude, MD,  [place] [address] [phone]  REFERRING PROVIDER:   Lynnell Jude, MD [address] [place],  [place] [address] [phone]  RESPONSIBLE PARTY:    Contact Information     Name Relation Home Work Mobile   Manuel "Remo Lipps" Spouse [phone]  [phone]   Manuel Klein Daughter [phone]          I met face to face with patient and family in  home. Palliative Care was asked to follow this patient by consultation request of  Manuel Jude, MD to address advance care planning and complex medical decision making. This is a follow up visit.                                   ASSESSMENT AND PLAN / RECOMMENDATIONS:   Advance Care Planning/Goals of Care: Goals include to maximize quality of life and symptom management. Our advance care planning conversation included a discussion about:    The value and importance of advance care planning  Exploration of personal, cultural or spiritual beliefs that might influence medical decisions  Exploration of goals of care in the event of a sudden injury or illness  Review of an  advance directive document . CODE STATUS: DNR  I completed a MOST form today. The patient and family outlined their wishes for the following treatment decisions:  Cardiopulmonary Resuscitation: Do Not Attempt Resuscitation (DNR/No CPR)  Medical Interventions: Comfort Measures: Keep clean, warm, and dry. Use medication by any route, positioning, wound care, and other measures to relieve pain and suffering. Use oxygen, suction and manual treatment of airway obstruction as needed for comfort. Do not transfer to the  hospital unless comfort needs cannot be met in current location.  Antibiotics: Antibiotics if indicated  IV Fluids: IV fluids if indicated  Feeding Tube: No feeding tube   Symptom Management/Plan:  I met with patient and wife in their home. I'd seen pt in the Snf and he was eager to get home. Manuel Klein states it has been difficult, he is not making fast progress with his mobility. He currently has [facility] home PT but endorses pain in his L LE, possible development of contractures, and a lot of pain with movement. He needs a gait belt, which wife will purchase. He can walk < 15 ft but can stand to pivot transfer. Discussed using w/c for safety.  We discussed pain management. He has norco he takes nightly. I proposed him using more so as to improve his mobility. He is reticent. They will begin acetaminophen CR 650 mg q 8 h however.  He has been constipated, and wife has been giving 2 sennas/ day. I also suggested bisacodyl to keep his movements more frequent. Avoid magnesium containing preparations.  Nutrition is not good, pt is eating 1 meal a day and endorses early satiety and dysgeusia. I have suggested Medpass which will give him 8 oz and 500 calories. Discussed him needing to drink this nutrition in order to get some strength. He endorses it tastes bad but denies nausea.   We did also discuss decline during this illness  and possibilities of it being end of life. He and his wife are aware that could be one of the paths possible. In that case, we will do some more discussing of choices for medical interventions. One impetus to return home was to return to PD in lieu of HD, as the latter was exhausting and time consuming. I have urged Manuel Klein to hire help to help with his personal care, as she does carry load of doing PD and monitoring his vs and bg daily as well.  I asked [facility]  representative to ask for HHA, but she did not feel they were available. 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  Follow up Palliative Care Visit: Palliative care will continue to follow for complex medical decision making, advance care planning, and clarification of goals. Return 2-3 weeks or prn.  I spent 40 minutes providing this consultation. More than 50% of the time in this consultation was spent in counseling and care coordination.  PPS: 40%  HOSPICE ELIGIBILITY/DIAGNOSIS: TBD  Chief Complaint: debility, pain  HISTORY OF PRESENT ILLNESS:  Manuel Klein is a 80 y.o. year old male  with ESRd, fall and hip fx, debility, pain, immobility .   History obtained from review of EMR, discussion with primary team, and interview with family, facility staff/caregiver and/or Manuel Klein.  I reviewed available labs, medications, imaging, studies and related documents from the EMR.  Records reviewed and summarized above.   ROS  General: NAD, drowsy ENMT: denies dysphagia Cardiovascular: denies chest pain, denies DOE Pulmonary: denies cough, denies increased SOB Abdomen: endorses poor appetite, endorses constipation, endorses continence of bowel GU: denies dysuria, endorses continence of urine, endorses oliguria MSK:  endorses weakness,  no falls reported Skin: denies rashes or wounds Neurological: endorses L leg pain, denies insomnia Psych: Endorses flat  mood Heme/lymph/immuno: denies bruises, abnormal bleeding  Physical Exam: Current and past weights:unavailable Constitutional: NAD General: frail appearing, thin EYES: anicteric sclera, lids intact, no discharge  ENMT: intact hearing, oral mucous membranes moist, dentition intact CV: RRR, no LE edema Pulmonary: no increased work of breathing, no cough, room air Abdomen: intake 125-50%, no ascites GU: deferred MSK: ++ sarcopenia, moves all extremities,  ambulatory short distances with walker and help Skin: warm and dry, no rashes or wounds on visible skin Neuro:  ++generalized weakness,  mild  cognitive  impairment Psych: non-anxious affect, A and O x 2-3 Hem/lymph/immuno: no widespread bruising  Thank you for the opportunity to participate in the care of Manuel Klein.  The palliative care team will continue to follow. Please call our office at [phone] if we can be of additional assistance.   Jason Coop, NP DNP, AGPCNP-BC  COVID-19 PATIENT SCREENING TOOL Asked and negative response unless otherwise noted:   Have you had symptoms of covid, tested positive or been in contact with someone with symptoms/positive test in the past 5-10 days?

## 2021-10-26 ENCOUNTER — Other Ambulatory Visit: Payer: Self-pay | Admitting: Pulmonary Disease

## 2021-10-26 ENCOUNTER — Encounter: Payer: Self-pay | Admitting: *Deleted

## 2021-10-26 DIAGNOSIS — C3411 Malignant neoplasm of upper lobe, right bronchus or lung: Secondary | ICD-10-CM

## 2021-10-26 NOTE — Progress Notes (Signed)
Referral received from Dr. Lanney Gins to follow up with patient for lung mass. Pt will need consult with Dr. Baruch Gouty after PET scan to discuss palliative radiation. Spoke with pt's wife to confirm that he is not interested in pursuing biopsy and only would like to discuss palliative radiation. Per notes from Dr. Lanney Gins, pt would be high risk for biopsy due to multiple conditions as well. Informed pt's wife that will get him an appt with Dr. Baruch Gouty a few days after his PET scan once scheduled. Contact info given to pt's wife and instructed to call with any questions. Will continue to monitor appts for pt's PET scan in order to coordinate his consult with Dr. Baruch Gouty. Nothing further needed at this time.

## 2021-10-28 NOTE — Anesthesia Postprocedure Evaluation (Signed)
Anesthesia Post Note  Patient: Manuel Klein  Procedure(s) Performed: ARTHROPLASTY UNIPOLAR  HIP (HEMIARTHROPLASTY) (Left: Hip) CYSTOSCOPY  Patient location during evaluation: PACU Anesthesia Type: Combined General/Spinal Level of consciousness: awake Pain management: pain level controlled Vital Signs Assessment: post-procedure vital signs reviewed and stable Respiratory status: spontaneous breathing and patient connected to nasal cannula oxygen Cardiovascular status: blood pressure returned to baseline Postop Assessment: no headache Anesthetic complications: no Comments: Patient seen in PACU post procedure.  No anesthetic issues.  (Note completed after documented date as was forgotten to be submitted)   No notable events documented.   Last Vitals:  Vitals:   08/30/21 1210 08/30/21 1603  BP: (!) 152/58 (!) 144/53  Pulse: 70 65  Resp: 16 18  Temp: 36.7 C 36.6 C  SpO2: 92% 90%    Last Pain:  Vitals:   08/30/21 1303  TempSrc:   PainSc: 2                  Harrie Foreman

## 2021-11-06 ENCOUNTER — Ambulatory Visit: Payer: Medicare Other

## 2021-11-07 ENCOUNTER — Telehealth: Payer: Self-pay | Admitting: *Deleted

## 2021-11-07 NOTE — Telephone Encounter (Signed)
Per pt's wife, will need to cancel appt with Dr. Massie Maroon on 12/22 since pt is currently in the hospital at [facility] after fracturing his ankle. Pt's wife stated that she will call back to reschedule his visit once he has been discharged.

## 2021-11-08 ENCOUNTER — Ambulatory Visit: Payer: Medicare Other | Admitting: Radiation Oncology

## 2021-11-15 ENCOUNTER — Other Ambulatory Visit: Payer: Medicare Other | Admitting: Primary Care

## 2021-11-21 ENCOUNTER — Encounter: Payer: Self-pay | Admitting: *Deleted

## 2021-12-19 DEATH — deceased
